# Patient Record
Sex: Female | Born: 1935 | Race: White | Hispanic: No | Smoking: Never smoker
Health system: Southern US, Community
[De-identification: ages and names within clinical notes are randomized; demographics above are authoritative.]

## PROBLEM LIST (undated history)

## (undated) DIAGNOSIS — Z9889 Other specified postprocedural states: Secondary | ICD-10-CM

## (undated) DIAGNOSIS — E785 Hyperlipidemia, unspecified: Secondary | ICD-10-CM

## (undated) DIAGNOSIS — F329 Major depressive disorder, single episode, unspecified: Secondary | ICD-10-CM

## (undated) DIAGNOSIS — I10 Essential (primary) hypertension: Secondary | ICD-10-CM

## (undated) DIAGNOSIS — R112 Nausea with vomiting, unspecified: Secondary | ICD-10-CM

## (undated) DIAGNOSIS — J449 Chronic obstructive pulmonary disease, unspecified: Secondary | ICD-10-CM

## (undated) DIAGNOSIS — F32A Depression, unspecified: Secondary | ICD-10-CM

## (undated) HISTORY — DX: Hyperlipidemia, unspecified: E78.5

## (undated) HISTORY — PX: ABDOMINAL HYSTERECTOMY: SHX81

---

## 2015-09-15 DIAGNOSIS — Z79899 Other long term (current) drug therapy: Secondary | ICD-10-CM | POA: Diagnosis not present

## 2015-09-15 DIAGNOSIS — E785 Hyperlipidemia, unspecified: Secondary | ICD-10-CM | POA: Diagnosis not present

## 2015-09-15 DIAGNOSIS — Z7982 Long term (current) use of aspirin: Secondary | ICD-10-CM | POA: Diagnosis not present

## 2015-09-15 DIAGNOSIS — J449 Chronic obstructive pulmonary disease, unspecified: Secondary | ICD-10-CM | POA: Diagnosis not present

## 2015-09-15 DIAGNOSIS — H26491 Other secondary cataract, right eye: Secondary | ICD-10-CM | POA: Diagnosis not present

## 2015-10-29 DIAGNOSIS — Z139 Encounter for screening, unspecified: Secondary | ICD-10-CM | POA: Diagnosis not present

## 2015-10-29 DIAGNOSIS — E782 Mixed hyperlipidemia: Secondary | ICD-10-CM | POA: Diagnosis not present

## 2015-10-29 DIAGNOSIS — Z1389 Encounter for screening for other disorder: Secondary | ICD-10-CM | POA: Diagnosis not present

## 2015-10-29 DIAGNOSIS — N182 Chronic kidney disease, stage 2 (mild): Secondary | ICD-10-CM | POA: Diagnosis not present

## 2015-10-29 DIAGNOSIS — Z0000 Encounter for general adult medical examination without abnormal findings: Secondary | ICD-10-CM | POA: Diagnosis not present

## 2015-10-29 DIAGNOSIS — I129 Hypertensive chronic kidney disease with stage 1 through stage 4 chronic kidney disease, or unspecified chronic kidney disease: Secondary | ICD-10-CM | POA: Diagnosis not present

## 2015-10-29 DIAGNOSIS — F4321 Adjustment disorder with depressed mood: Secondary | ICD-10-CM | POA: Diagnosis not present

## 2015-10-29 DIAGNOSIS — Z6830 Body mass index (BMI) 30.0-30.9, adult: Secondary | ICD-10-CM | POA: Diagnosis not present

## 2015-11-03 DIAGNOSIS — Z1231 Encounter for screening mammogram for malignant neoplasm of breast: Secondary | ICD-10-CM | POA: Diagnosis not present

## 2016-04-11 DIAGNOSIS — Z6830 Body mass index (BMI) 30.0-30.9, adult: Secondary | ICD-10-CM | POA: Diagnosis not present

## 2016-04-11 DIAGNOSIS — J449 Chronic obstructive pulmonary disease, unspecified: Secondary | ICD-10-CM | POA: Diagnosis not present

## 2016-04-11 DIAGNOSIS — R5381 Other malaise: Secondary | ICD-10-CM | POA: Diagnosis not present

## 2016-05-03 DIAGNOSIS — I129 Hypertensive chronic kidney disease with stage 1 through stage 4 chronic kidney disease, or unspecified chronic kidney disease: Secondary | ICD-10-CM | POA: Diagnosis not present

## 2016-05-03 DIAGNOSIS — N182 Chronic kidney disease, stage 2 (mild): Secondary | ICD-10-CM | POA: Diagnosis not present

## 2016-05-03 DIAGNOSIS — Z23 Encounter for immunization: Secondary | ICD-10-CM | POA: Diagnosis not present

## 2016-05-03 DIAGNOSIS — E782 Mixed hyperlipidemia: Secondary | ICD-10-CM | POA: Diagnosis not present

## 2016-06-13 DIAGNOSIS — H2703 Aphakia, bilateral: Secondary | ICD-10-CM | POA: Diagnosis not present

## 2016-07-15 DIAGNOSIS — L301 Dyshidrosis [pompholyx]: Secondary | ICD-10-CM | POA: Diagnosis not present

## 2016-07-15 DIAGNOSIS — L918 Other hypertrophic disorders of the skin: Secondary | ICD-10-CM | POA: Diagnosis not present

## 2016-07-15 DIAGNOSIS — L820 Inflamed seborrheic keratosis: Secondary | ICD-10-CM | POA: Diagnosis not present

## 2016-09-05 DIAGNOSIS — R062 Wheezing: Secondary | ICD-10-CM | POA: Diagnosis not present

## 2016-09-05 DIAGNOSIS — J101 Influenza due to other identified influenza virus with other respiratory manifestations: Secondary | ICD-10-CM | POA: Diagnosis not present

## 2016-09-05 DIAGNOSIS — Z6831 Body mass index (BMI) 31.0-31.9, adult: Secondary | ICD-10-CM | POA: Diagnosis not present

## 2016-11-04 DIAGNOSIS — Z1231 Encounter for screening mammogram for malignant neoplasm of breast: Secondary | ICD-10-CM | POA: Diagnosis not present

## 2016-11-07 DIAGNOSIS — Z6831 Body mass index (BMI) 31.0-31.9, adult: Secondary | ICD-10-CM | POA: Diagnosis not present

## 2016-11-07 DIAGNOSIS — J441 Chronic obstructive pulmonary disease with (acute) exacerbation: Secondary | ICD-10-CM | POA: Diagnosis not present

## 2016-11-14 DIAGNOSIS — J449 Chronic obstructive pulmonary disease, unspecified: Secondary | ICD-10-CM | POA: Diagnosis not present

## 2016-11-14 DIAGNOSIS — Z139 Encounter for screening, unspecified: Secondary | ICD-10-CM | POA: Diagnosis not present

## 2016-11-14 DIAGNOSIS — Z1389 Encounter for screening for other disorder: Secondary | ICD-10-CM | POA: Diagnosis not present

## 2016-11-14 DIAGNOSIS — Z0000 Encounter for general adult medical examination without abnormal findings: Secondary | ICD-10-CM | POA: Diagnosis not present

## 2016-12-20 DIAGNOSIS — Z6830 Body mass index (BMI) 30.0-30.9, adult: Secondary | ICD-10-CM | POA: Diagnosis not present

## 2016-12-20 DIAGNOSIS — H938X3 Other specified disorders of ear, bilateral: Secondary | ICD-10-CM | POA: Diagnosis not present

## 2017-05-15 DIAGNOSIS — J441 Chronic obstructive pulmonary disease with (acute) exacerbation: Secondary | ICD-10-CM | POA: Diagnosis not present

## 2017-05-15 DIAGNOSIS — J449 Chronic obstructive pulmonary disease, unspecified: Secondary | ICD-10-CM | POA: Diagnosis not present

## 2017-05-15 DIAGNOSIS — Z6830 Body mass index (BMI) 30.0-30.9, adult: Secondary | ICD-10-CM | POA: Diagnosis not present

## 2017-06-01 DIAGNOSIS — J22 Unspecified acute lower respiratory infection: Secondary | ICD-10-CM | POA: Diagnosis not present

## 2017-06-01 DIAGNOSIS — Z6832 Body mass index (BMI) 32.0-32.9, adult: Secondary | ICD-10-CM | POA: Diagnosis not present

## 2017-06-05 DIAGNOSIS — Z6833 Body mass index (BMI) 33.0-33.9, adult: Secondary | ICD-10-CM | POA: Diagnosis not present

## 2017-06-05 DIAGNOSIS — E860 Dehydration: Secondary | ICD-10-CM | POA: Diagnosis not present

## 2017-06-05 DIAGNOSIS — R05 Cough: Secondary | ICD-10-CM | POA: Diagnosis not present

## 2017-06-05 DIAGNOSIS — H2703 Aphakia, bilateral: Secondary | ICD-10-CM | POA: Diagnosis not present

## 2017-06-07 DIAGNOSIS — E871 Hypo-osmolality and hyponatremia: Secondary | ICD-10-CM | POA: Diagnosis not present

## 2017-06-07 DIAGNOSIS — R0602 Shortness of breath: Secondary | ICD-10-CM | POA: Diagnosis not present

## 2017-06-07 DIAGNOSIS — J181 Lobar pneumonia, unspecified organism: Secondary | ICD-10-CM | POA: Diagnosis not present

## 2017-06-07 DIAGNOSIS — K5900 Constipation, unspecified: Secondary | ICD-10-CM | POA: Diagnosis not present

## 2017-06-07 DIAGNOSIS — R740 Nonspecific elevation of levels of transaminase and lactic acid dehydrogenase [LDH]: Secondary | ICD-10-CM | POA: Diagnosis not present

## 2017-06-07 DIAGNOSIS — I5031 Acute diastolic (congestive) heart failure: Secondary | ICD-10-CM | POA: Diagnosis not present

## 2017-06-07 DIAGNOSIS — I10 Essential (primary) hypertension: Secondary | ICD-10-CM | POA: Diagnosis not present

## 2017-06-07 DIAGNOSIS — R51 Headache: Secondary | ICD-10-CM | POA: Diagnosis not present

## 2017-06-07 DIAGNOSIS — J449 Chronic obstructive pulmonary disease, unspecified: Secondary | ICD-10-CM | POA: Diagnosis not present

## 2017-06-07 DIAGNOSIS — J90 Pleural effusion, not elsewhere classified: Secondary | ICD-10-CM | POA: Diagnosis not present

## 2017-06-07 DIAGNOSIS — I110 Hypertensive heart disease with heart failure: Secondary | ICD-10-CM | POA: Diagnosis not present

## 2017-06-07 DIAGNOSIS — K7581 Nonalcoholic steatohepatitis (NASH): Secondary | ICD-10-CM | POA: Diagnosis not present

## 2017-06-07 DIAGNOSIS — I509 Heart failure, unspecified: Secondary | ICD-10-CM | POA: Diagnosis not present

## 2017-06-07 DIAGNOSIS — R05 Cough: Secondary | ICD-10-CM | POA: Diagnosis not present

## 2017-06-07 DIAGNOSIS — I429 Cardiomyopathy, unspecified: Secondary | ICD-10-CM | POA: Diagnosis not present

## 2017-06-07 DIAGNOSIS — S3993XA Unspecified injury of pelvis, initial encounter: Secondary | ICD-10-CM | POA: Diagnosis not present

## 2017-06-07 DIAGNOSIS — R609 Edema, unspecified: Secondary | ICD-10-CM | POA: Diagnosis not present

## 2017-06-07 DIAGNOSIS — E781 Pure hyperglyceridemia: Secondary | ICD-10-CM | POA: Diagnosis not present

## 2017-06-07 DIAGNOSIS — R846 Abnormal cytological findings in specimens from respiratory organs and thorax: Secondary | ICD-10-CM | POA: Diagnosis not present

## 2017-06-07 DIAGNOSIS — I4891 Unspecified atrial fibrillation: Secondary | ICD-10-CM | POA: Diagnosis not present

## 2017-06-07 DIAGNOSIS — Z7982 Long term (current) use of aspirin: Secondary | ICD-10-CM | POA: Diagnosis not present

## 2017-06-07 DIAGNOSIS — Z23 Encounter for immunization: Secondary | ICD-10-CM | POA: Diagnosis not present

## 2017-06-08 DIAGNOSIS — R609 Edema, unspecified: Secondary | ICD-10-CM | POA: Diagnosis not present

## 2017-06-08 DIAGNOSIS — I509 Heart failure, unspecified: Secondary | ICD-10-CM | POA: Diagnosis not present

## 2017-06-08 DIAGNOSIS — I4891 Unspecified atrial fibrillation: Secondary | ICD-10-CM | POA: Diagnosis not present

## 2017-06-08 DIAGNOSIS — R0602 Shortness of breath: Secondary | ICD-10-CM | POA: Diagnosis not present

## 2017-06-09 DIAGNOSIS — E871 Hypo-osmolality and hyponatremia: Secondary | ICD-10-CM | POA: Diagnosis not present

## 2017-06-09 DIAGNOSIS — R0602 Shortness of breath: Secondary | ICD-10-CM

## 2017-06-09 DIAGNOSIS — J90 Pleural effusion, not elsewhere classified: Secondary | ICD-10-CM | POA: Diagnosis not present

## 2017-06-09 DIAGNOSIS — I509 Heart failure, unspecified: Secondary | ICD-10-CM | POA: Diagnosis not present

## 2017-06-09 DIAGNOSIS — I4891 Unspecified atrial fibrillation: Secondary | ICD-10-CM | POA: Diagnosis not present

## 2017-06-09 DIAGNOSIS — J181 Lobar pneumonia, unspecified organism: Secondary | ICD-10-CM | POA: Diagnosis not present

## 2017-06-09 DIAGNOSIS — R740 Nonspecific elevation of levels of transaminase and lactic acid dehydrogenase [LDH]: Secondary | ICD-10-CM | POA: Diagnosis not present

## 2017-06-09 DIAGNOSIS — R609 Edema, unspecified: Secondary | ICD-10-CM

## 2017-06-09 DIAGNOSIS — I10 Essential (primary) hypertension: Secondary | ICD-10-CM | POA: Diagnosis not present

## 2017-06-10 DIAGNOSIS — I4891 Unspecified atrial fibrillation: Secondary | ICD-10-CM | POA: Diagnosis not present

## 2017-06-10 DIAGNOSIS — I509 Heart failure, unspecified: Secondary | ICD-10-CM | POA: Diagnosis not present

## 2017-06-10 DIAGNOSIS — R609 Edema, unspecified: Secondary | ICD-10-CM | POA: Diagnosis not present

## 2017-06-12 ENCOUNTER — Inpatient Hospital Stay (HOSPITAL_COMMUNITY)
Admission: AD | Admit: 2017-06-12 | Discharge: 2017-06-14 | DRG: 308 | Disposition: A | Discharge status: Home / Self Care | Payer: PPO | Source: Other Acute Inpatient Hospital | Attending: Family Medicine | Admitting: Family Medicine

## 2017-06-12 ENCOUNTER — Encounter (HOSPITAL_COMMUNITY): Payer: Self-pay | Admitting: Internal Medicine

## 2017-06-12 DIAGNOSIS — I429 Cardiomyopathy, unspecified: Secondary | ICD-10-CM | POA: Diagnosis not present

## 2017-06-12 DIAGNOSIS — I481 Persistent atrial fibrillation: Principal | ICD-10-CM | POA: Diagnosis present

## 2017-06-12 DIAGNOSIS — I253 Aneurysm of heart: Secondary | ICD-10-CM | POA: Diagnosis present

## 2017-06-12 DIAGNOSIS — I5023 Acute on chronic systolic (congestive) heart failure: Secondary | ICD-10-CM | POA: Diagnosis present

## 2017-06-12 DIAGNOSIS — I4891 Unspecified atrial fibrillation: Secondary | ICD-10-CM | POA: Diagnosis not present

## 2017-06-12 DIAGNOSIS — E669 Obesity, unspecified: Secondary | ICD-10-CM | POA: Diagnosis not present

## 2017-06-12 DIAGNOSIS — I5021 Acute systolic (congestive) heart failure: Secondary | ICD-10-CM | POA: Diagnosis not present

## 2017-06-12 DIAGNOSIS — J90 Pleural effusion, not elsewhere classified: Secondary | ICD-10-CM | POA: Diagnosis present

## 2017-06-12 DIAGNOSIS — R7401 Elevation of levels of liver transaminase levels: Secondary | ICD-10-CM | POA: Diagnosis present

## 2017-06-12 DIAGNOSIS — E871 Hypo-osmolality and hyponatremia: Secondary | ICD-10-CM | POA: Diagnosis present

## 2017-06-12 DIAGNOSIS — Z90710 Acquired absence of both cervix and uterus: Secondary | ICD-10-CM

## 2017-06-12 DIAGNOSIS — I340 Nonrheumatic mitral (valve) insufficiency: Secondary | ICD-10-CM | POA: Diagnosis not present

## 2017-06-12 DIAGNOSIS — K5900 Constipation, unspecified: Secondary | ICD-10-CM | POA: Diagnosis not present

## 2017-06-12 DIAGNOSIS — R740 Nonspecific elevation of levels of transaminase and lactic acid dehydrogenase [LDH]: Secondary | ICD-10-CM

## 2017-06-12 DIAGNOSIS — J181 Lobar pneumonia, unspecified organism: Secondary | ICD-10-CM | POA: Diagnosis not present

## 2017-06-12 DIAGNOSIS — I959 Hypotension, unspecified: Secondary | ICD-10-CM | POA: Diagnosis not present

## 2017-06-12 DIAGNOSIS — Z6830 Body mass index (BMI) 30.0-30.9, adult: Secondary | ICD-10-CM

## 2017-06-12 DIAGNOSIS — I361 Nonrheumatic tricuspid (valve) insufficiency: Secondary | ICD-10-CM | POA: Diagnosis not present

## 2017-06-12 DIAGNOSIS — F329 Major depressive disorder, single episode, unspecified: Secondary | ICD-10-CM | POA: Diagnosis not present

## 2017-06-12 DIAGNOSIS — Z23 Encounter for immunization: Secondary | ICD-10-CM | POA: Diagnosis not present

## 2017-06-12 DIAGNOSIS — Z7982 Long term (current) use of aspirin: Secondary | ICD-10-CM | POA: Diagnosis not present

## 2017-06-12 DIAGNOSIS — Q211 Atrial septal defect: Secondary | ICD-10-CM

## 2017-06-12 DIAGNOSIS — J449 Chronic obstructive pulmonary disease, unspecified: Secondary | ICD-10-CM | POA: Diagnosis present

## 2017-06-12 DIAGNOSIS — Z8249 Family history of ischemic heart disease and other diseases of the circulatory system: Secondary | ICD-10-CM | POA: Diagnosis not present

## 2017-06-12 DIAGNOSIS — I10 Essential (primary) hypertension: Secondary | ICD-10-CM

## 2017-06-12 DIAGNOSIS — I083 Combined rheumatic disorders of mitral, aortic and tricuspid valves: Secondary | ICD-10-CM | POA: Diagnosis not present

## 2017-06-12 DIAGNOSIS — I509 Heart failure, unspecified: Secondary | ICD-10-CM | POA: Diagnosis not present

## 2017-06-12 DIAGNOSIS — I5031 Acute diastolic (congestive) heart failure: Secondary | ICD-10-CM | POA: Diagnosis not present

## 2017-06-12 DIAGNOSIS — I110 Hypertensive heart disease with heart failure: Secondary | ICD-10-CM | POA: Diagnosis present

## 2017-06-12 DIAGNOSIS — I5042 Chronic combined systolic (congestive) and diastolic (congestive) heart failure: Secondary | ICD-10-CM | POA: Diagnosis present

## 2017-06-12 DIAGNOSIS — I480 Paroxysmal atrial fibrillation: Secondary | ICD-10-CM | POA: Diagnosis present

## 2017-06-12 DIAGNOSIS — E781 Pure hyperglyceridemia: Secondary | ICD-10-CM | POA: Diagnosis not present

## 2017-06-12 DIAGNOSIS — K7581 Nonalcoholic steatohepatitis (NASH): Secondary | ICD-10-CM | POA: Diagnosis not present

## 2017-06-12 HISTORY — DX: Other specified postprocedural states: Z98.890

## 2017-06-12 HISTORY — DX: Chronic obstructive pulmonary disease, unspecified: J44.9

## 2017-06-12 HISTORY — DX: Depression, unspecified: F32.A

## 2017-06-12 HISTORY — DX: Nausea with vomiting, unspecified: R11.2

## 2017-06-12 HISTORY — DX: Major depressive disorder, single episode, unspecified: F32.9

## 2017-06-12 HISTORY — DX: Essential (primary) hypertension: I10

## 2017-06-12 MED ORDER — POLYETHYLENE GLYCOL 3350 17 G PO PACK
17.0000 g | PACK | Freq: Every day | ORAL | Status: DC | PRN
  Administered 2017-06-13: 17 g via ORAL
  Filled 2017-06-12 (×2): qty 1

## 2017-06-12 MED ORDER — BUDESONIDE 0.5 MG/2ML IN SUSP
0.5000 mg | Freq: Two times a day (BID) | RESPIRATORY_TRACT | Status: DC
  Administered 2017-06-12 – 2017-06-14 (×4): 0.5 mg via RESPIRATORY_TRACT
  Filled 2017-06-12 (×4): qty 2

## 2017-06-12 MED ORDER — ONDANSETRON HCL 4 MG/2ML IJ SOLN: 4.0000 mg | Freq: Four times a day (QID) | INTRAMUSCULAR | Status: DC | PRN

## 2017-06-12 MED ORDER — ATORVASTATIN CALCIUM 20 MG PO TABS
20.0000 mg | ORAL_TABLET | Freq: Every day | ORAL | Status: DC
  Administered 2017-06-13: 20 mg via ORAL
  Filled 2017-06-12: qty 1

## 2017-06-12 MED ORDER — FLUTICASONE FUROATE-VILANTEROL 200-25 MCG/INH IN AEPB
1.0000 | INHALATION_SPRAY | Freq: Every day | RESPIRATORY_TRACT | Status: DC
  Filled 2017-06-12: qty 28

## 2017-06-12 MED ORDER — APIXABAN 5 MG PO TABS
5.0000 mg | ORAL_TABLET | Freq: Two times a day (BID) | ORAL | Status: DC
  Administered 2017-06-12 – 2017-06-14 (×4): 5 mg via ORAL
  Filled 2017-06-12 (×4): qty 1

## 2017-06-12 MED ORDER — ONDANSETRON HCL 4 MG PO TABS: 4.0000 mg | ORAL_TABLET | Freq: Four times a day (QID) | ORAL | Status: DC | PRN

## 2017-06-12 MED ORDER — DIGOXIN 125 MCG PO TABS
0.1250 mg | ORAL_TABLET | Freq: Every day | ORAL | Status: DC
  Administered 2017-06-14: 0.125 mg via ORAL
  Filled 2017-06-12 (×2): qty 1

## 2017-06-12 MED ORDER — ALBUTEROL SULFATE (2.5 MG/3ML) 0.083% IN NEBU: 2.5000 mg | INHALATION_SOLUTION | RESPIRATORY_TRACT | Status: DC | PRN

## 2017-06-12 MED ORDER — ACETAMINOPHEN 325 MG PO TABS: 650.0000 mg | ORAL_TABLET | Freq: Four times a day (QID) | ORAL | Status: DC | PRN

## 2017-06-12 MED ORDER — METOPROLOL TARTRATE 50 MG PO TABS
50.0000 mg | ORAL_TABLET | Freq: Two times a day (BID) | ORAL | Status: DC
  Filled 2017-06-12: qty 1

## 2017-06-12 MED ORDER — ACETAMINOPHEN 650 MG RE SUPP: 650.0000 mg | Freq: Four times a day (QID) | RECTAL | Status: DC | PRN

## 2017-06-12 MED ORDER — METOPROLOL TARTRATE 25 MG PO TABS
25.0000 mg | ORAL_TABLET | Freq: Two times a day (BID) | ORAL | Status: DC
  Administered 2017-06-12 – 2017-06-13 (×2): 25 mg via ORAL
  Filled 2017-06-12 (×2): qty 1

## 2017-06-12 NOTE — H&P (Signed)
History and Physical    Stephanie Douglas ZOX:[id number] DOB: [birth year] DOA: 06/12/2017  PCP: Marco Collie, MD  Patient coming from: [facility]  I have personally briefly reviewed patient's old medical records in [place]  Chief Complaint: A.Fib RVR  HPI: Stephanie Douglas is a 83 y.o. female with medical history significant of HTN.  Patient presents to ED at [facility] on 11/7 with 3 week history of cough, now progressed to leg swelling, dizziness, generalized weakness.  Found to have CHF, pleural effusion, and be in A.Fib RVR during work up in ED.  HR initially reduced with cardizem gtt.  Echo showed EF 35-40%.  Converted to metoprolol (50mg  BID according to progress notes but 25 mg BID according to med rec).  Digoxin added.  Rate moderately controlled but still 110-130s.  Diuresed 5L but then diuresis had to be held due to hypotension.  Lexiscan performed and was normal.  Eliquis started on Friday.  Patient transferred to [facility] to undergo TEE cardioversion tomorrow.   Review of Systems: As per HPI otherwise 10 point review of systems negative.   Past Medical History:  Diagnosis Date  . COPD (chronic obstructive pulmonary disease) ([place])   . Depression   . HTN (hypertension)     Past Surgical History:  Procedure Laterality Date  . ABDOMINAL HYSTERECTOMY       reports that  has never smoked. She does not have any smokeless tobacco history on file. She reports that she does not drink alcohol or use drugs.  Allergies no known allergies  No family history on file. No known h/o A.Fib  Prior to Admission medications   Not on File    Physical Exam: Vitals:   06/12/17 2100  Resp: 12  Temp: 98.9 F (37.2 C)  TempSrc: Oral  Weight: 81.6 kg (180 lb)  Height: 5\' 5"  (1.651 m)    Constitutional: NAD, calm, comfortable Eyes: PERRL, lids and conjunctivae normal ENMT: Mucous membranes are moist. Posterior pharynx clear of any exudate or lesions.Normal dentition.  Neck: normal,  supple, no masses, no thyromegaly Respiratory: clear to auscultation bilaterally, no wheezing, no crackles. Normal respiratory effort. No accessory muscle use.  Cardiovascular: IRR, IRR, tachycardic Abdomen: no tenderness, no masses palpated. No hepatosplenomegaly. Bowel sounds positive.  Musculoskeletal: no clubbing / cyanosis. No joint deformity upper and lower extremities. Good ROM, no contractures. Normal muscle tone.  Skin: no rashes, lesions, ulcers. No induration Neurologic: CN 2-12 grossly intact. Sensation intact, DTR normal. Strength 5/5 in all 4.  Psychiatric: Normal judgment and insight. Alert and oriented x 3. Normal mood.    Labs on Admission: I have personally reviewed following labs and imaging studies  CBC: No results for input(s): WBC, NEUTROABS, HGB, HCT, MCV, PLT in the last 168 hours. Basic Metabolic Panel: No results for input(s): NA, K, CL, CO2, GLUCOSE, BUN, CREATININE, CALCIUM, MG, PHOS in the last 168 hours. GFR: CrCl cannot be calculated (No order found.). Liver Function Tests: No results for input(s): AST, ALT, ALKPHOS, BILITOT, PROT, ALBUMIN in the last 168 hours. No results for input(s): LIPASE, AMYLASE in the last 168 hours. No results for input(s): AMMONIA in the last 168 hours. Coagulation Profile: No results for input(s): INR, PROTIME in the last 168 hours. Cardiac Enzymes: No results for input(s): CKTOTAL, CKMB, CKMBINDEX, TROPONINI in the last 168 hours. BNP (last 3 results) No results for input(s): PROBNP in the last 8760 hours. HbA1C: No results for input(s): HGBA1C in the last 72 hours. CBG: No results for  input(s): GLUCAP in the last 168 hours. Lipid Profile: No results for input(s): CHOL, HDL, LDLCALC, TRIG, CHOLHDL, LDLDIRECT in the last 72 hours. Thyroid Function Tests: No results for input(s): TSH, T4TOTAL, FREET4, T3FREE, THYROIDAB in the last 72 hours. Anemia Panel: No results for input(s): VITAMINB12, FOLATE, FERRITIN, TIBC, IRON,  RETICCTPCT in the last 72 hours. Urine analysis: No results found for: COLORURINE, APPEARANCEUR, LABSPEC, PHURINE, GLUCOSEU, HGBUR, BILIRUBINUR, KETONESUR, PROTEINUR, UROBILINOGEN, NITRITE, LEUKOCYTESUR  Radiological Exams on Admission: No results found.  EKG: Independently reviewed.  Assessment/Plan Principal Problem:   Atrial fibrillation with RVR (HCC) Active Problems:   Acute systolic CHF (congestive heart failure) (HCC)   Pleural effusion on right   Hyponatremia   Transaminitis   HTN (hypertension)   COPD (chronic obstructive pulmonary disease) ([place])    1. A.Fib RVR - 1. Chads vasc of 5 2. Eliquis started on Friday, will continue 3. TSH nl 4. No underlying infection 5. Trop nl 6. Echo showed EF 35-40%, no regional wall motion abnormalities 7. Lexiscan neg 8. Metoprolol 25mg  PO BID 1. Note that Dr. Sabino Niemann notes say 50mg  BID as of yesterday; however, med rec on transfer shows 25mg  PO BID so will continue at this lower dose for now especially given hypotension issues during stay at [place]. 9. Dr. Loleta Books spoke with Dr. Johnsie Cancel, plan is for TEE cardioversion tomorrow 1. NPO after MN 10. Tele monitor 2. Acute CHF - appears systolic with reduced EF 1. Holding diuretics at the moment due to problems with hypotension with diuresis 1. May want to re-assess this after cardioversion 2. Currently net neg 5L since admit 3. Strict intake and output 3. Pleural effusion on right - 1. Transudate, 750 cc removed on thoracentesis.  C/W CHF 4. Hyponatremia - on admit to [facility], resolved according to notes 5. Transaminitis - 1. Repeat CMP in AM 2. CHF vs NASH 6. HTN - 1. BP has been running "soft" and [place] 2. Holding amlodipine 3. Metoprolol 25mg  PO BID 7. COPD - 1. Continue pulmicort 8. Other meds: 1. Lexapro on hold given hyponatremia 2. Miralax PRN  DVT prophylaxis: Eliquis Code Status: Full Family Communication: Family at bedside Disposition Plan: Home after  admit Consults called: Message put in to P. [facility] for Cards to see patient, see above Admission status: Admit to inpatient   Etta Quill DO [facility] Pager [phone]  If 7AM-7PM, please contact day team taking care of patient [email or web address] Password [email or web address]  06/12/2017, 10:30 PM

## 2017-06-12 NOTE — Progress Notes (Signed)
ANTICOAGULATION CONSULT NOTE - Initial Consult  Pharmacy Consult for Eliquis Indication: atrial fibrillation  Allergies no known allergies  Patient Measurements: Height: 5\' 5"  (165.1 cm) Weight: 180 lb (81.6 kg) IBW/kg (Calculated) : 57  Vital Signs: Temp: 98.5 F (36.9 C) (11/12 2200) Temp Source: Oral (11/12 2200) BP: 121/81 (11/12 2200) Pulse Rate: 129 (11/12 2235)  Labs from [facility]:  Creatinine 0.7 on 11/12  Hgb 12.6, platelet count 158 on 11/11 (platelet count 188 on admit 11/7)  Medical History: Past Medical History:  Diagnosis Date  . COPD (chronic obstructive pulmonary disease) ([place])   . Depression   . HTN (hypertension)    Assessment:  83 yr old female transferred from [facility] to [facility].   Started on Eliquis 5 mg BID on 11/9 and to continue at [facility].     Cardioversion planned for 11/58 am.  >26 years old, but weight >60 kg and Scr < 1.5; full-dose Eliquis is appropriate  Goal of Therapy:  appropriate Eliquis dose for indication Monitor platelets by anticoagulation protocol: Yes   Plan:   Continue Eliquis 5 mg PO BID.  Intermittent CBC.  Monitor for any bleeding.  Education prior to discharge.  Arty Baumgartner, [place] Pager: [phone] 06/12/2017,11:21 PM

## 2017-06-13 ENCOUNTER — Other Ambulatory Visit: Payer: Self-pay

## 2017-06-13 ENCOUNTER — Inpatient Hospital Stay (HOSPITAL_COMMUNITY): Payer: PPO | Admitting: Anesthesiology

## 2017-06-13 ENCOUNTER — Encounter (HOSPITAL_COMMUNITY): Payer: Self-pay | Admitting: Cardiology

## 2017-06-13 ENCOUNTER — Encounter (HOSPITAL_COMMUNITY)
Admission: AD | Disposition: A | Discharge status: Home / Self Care | Payer: Self-pay | Source: Other Acute Inpatient Hospital | Attending: Internal Medicine

## 2017-06-13 ENCOUNTER — Ambulatory Visit (HOSPITAL_COMMUNITY): Admission: RE | Admit: 2017-06-13 | Payer: PPO | Source: Ambulatory Visit | Admitting: Cardiology

## 2017-06-13 ENCOUNTER — Inpatient Hospital Stay (HOSPITAL_COMMUNITY)
Admission: AD | Admit: 2017-06-13 | Discharge: 2017-06-13 | Disposition: A | Discharge status: Home / Self Care | Payer: PPO | Source: Other Acute Inpatient Hospital | Attending: Cardiology | Admitting: Cardiology

## 2017-06-13 DIAGNOSIS — I4891 Unspecified atrial fibrillation: Secondary | ICD-10-CM

## 2017-06-13 DIAGNOSIS — I340 Nonrheumatic mitral (valve) insufficiency: Secondary | ICD-10-CM

## 2017-06-13 HISTORY — PX: CARDIOVERSION: SHX1299

## 2017-06-13 HISTORY — PX: TEE WITHOUT CARDIOVERSION: SHX5443

## 2017-06-13 LAB — COMPREHENSIVE METABOLIC PANEL
ALBUMIN: 2.9 g/dL — AB (ref 3.5–5.0)
ALK PHOS: 67 U/L (ref 38–126)
ALT: 31 U/L (ref 14–54)
ANION GAP: 8 (ref 5–15)
AST: 23 U/L (ref 15–41)
BILIRUBIN TOTAL: 0.9 mg/dL (ref 0.3–1.2)
BUN: 14 mg/dL (ref 6–20)
CO2: 29 mmol/L (ref 22–32)
Calcium: 8.7 mg/dL — ABNORMAL LOW (ref 8.9–10.3)
Chloride: 98 mmol/L — ABNORMAL LOW (ref 101–111)
Creatinine, Ser: 0.66 mg/dL (ref 0.44–1.00)
GFR calc non Af Amer: >60 mL/min (ref >60)
GLUCOSE: 111 mg/dL — AB (ref 65–99)
POTASSIUM: 4.2 mmol/L (ref 3.5–5.1)
Sodium: 135 mmol/L (ref 135–145)
TOTAL PROTEIN: 5.5 g/dL — AB (ref 6.5–8.1)

## 2017-06-13 LAB — CBC
HEMATOCRIT: 37.7 % (ref 36.0–46.0)
HEMOGLOBIN: 12.3 g/dL (ref 12.0–15.0)
MCH: 30.1 pg (ref 26.0–34.0)
MCHC: 32.6 g/dL (ref 30.0–36.0)
MCV: 92.4 fL (ref 78.0–100.0)
Platelets: 170 10*3/uL (ref 150–400)
RBC: 4.08 MIL/uL (ref 3.87–5.11)
RDW: 15.6 % — AB (ref 11.5–15.5)
WBC: 8.5 10*3/uL (ref 4.0–10.5)

## 2017-06-13 SURGERY — CARDIOVERSION
Anesthesia: Monitor Anesthesia Care

## 2017-06-13 MED ORDER — PHENYLEPHRINE HCL 10 MG/ML IJ SOLN
INTRAMUSCULAR | Status: DC | PRN
  Administered 2017-06-13 (×2): 80 ug via INTRAVENOUS

## 2017-06-13 MED ORDER — GUAIFENESIN-DM 100-10 MG/5ML PO SYRP
5.0000 mL | ORAL_SOLUTION | ORAL | Status: DC | PRN
  Administered 2017-06-13: 5 mL via ORAL
  Filled 2017-06-13: qty 5

## 2017-06-13 MED ORDER — LACTATED RINGERS IV SOLN
INTRAVENOUS | Status: DC
  Administered 2017-06-13: 1000 mL via INTRAVENOUS

## 2017-06-13 MED ORDER — PROPOFOL 500 MG/50ML IV EMUL
INTRAVENOUS | Status: DC | PRN
  Administered 2017-06-13: 50 ug/kg/min via INTRAVENOUS

## 2017-06-13 MED ORDER — SODIUM CHLORIDE 0.9 % IV SOLN: INTRAVENOUS | Status: DC

## 2017-06-13 MED ORDER — PROPOFOL 10 MG/ML IV BOLUS
INTRAVENOUS | Status: DC | PRN
  Administered 2017-06-13: 20 mg via INTRAVENOUS

## 2017-06-13 MED ORDER — BUTAMBEN-TETRACAINE-BENZOCAINE 2-2-14 % EX AERO
INHALATION_SPRAY | CUTANEOUS | Status: DC | PRN
  Administered 2017-06-13: 2 via TOPICAL

## 2017-06-13 NOTE — Anesthesia Postprocedure Evaluation (Signed)
Anesthesia Post Note  Patient: Stephanie Douglas  Procedure(s) Performed: CARDIOVERSION (N/A ) TRANSESOPHAGEAL ECHOCARDIOGRAM (TEE) (N/A )     Patient location during evaluation: PACU Anesthesia Type: MAC Level of consciousness: awake and alert Pain management: pain level controlled Vital Signs Assessment: post-procedure vital signs reviewed and stable Respiratory status: spontaneous breathing, nonlabored ventilation and respiratory function stable Cardiovascular status: stable and blood pressure returned to baseline Anesthetic complications: no    Last Vitals:  Vitals:   06/13/17 1534 06/13/17 1548  BP: 104/86   Pulse:    Resp:    Temp:    SpO2:  96%    Last Pain:  Vitals:   06/13/17 1318  TempSrc: Oral                 Audry Pili

## 2017-06-13 NOTE — H&P (View-Only) (Signed)
Cardiology Consultation:   Patient ID: Stephanie Douglas; [id number]; [birth year]   Admit date: 06/12/2017 Date of Consult: 06/13/2017  Primary Care Provider: Marco Collie, MD Primary Cardiologist: New-Dr. Percival Douglas   Patient Profile:   Stephanie Douglas is a 83 y.o. female with a hx of HTN who is being seen today for the evaluation of atrial fib and CHF at the request of Dr. Cathlean Douglas.  History of Present Illness:   Stephanie Douglas presented to the ED at [facility] on 11/7 with a 3 week history of cough that progressed with leg swelling, dizziness, and generalized weakness. She was found to have CHF, pleural effusion and was in afib with RVR. Her heart rate was intially reduced with cardizem drip. Echo showed EF 35-40% so cardizem was switched to metoprolol. Digoxin was added. Heart rates have been moderately controlled but still in the 110-130's. She was diuresed 5L but diuresis was held due to hypotension. Lexiscan was normal. Eliquis was started on Friday.   The patient thinks that she may have had symptoms of palpitations for 2-3 weeks and then she began to develop edema and DOE. 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 Currently she denies chest discomfort, dyspnea, dizziness, palpitations, swelling.   With continued poorly controlled heart rates and heart failure, the patient was transferred to [facility] for TEE/cardioversion.   Past Medical History:  Diagnosis Date  . COPD (chronic obstructive pulmonary disease) ([place])   . Depression   . HTN (hypertension)     Past Surgical History:  Procedure Laterality Date  . ABDOMINAL HYSTERECTOMY       Home Medications:  Prior to Admission medications   Not on File    Inpatient Medications: Scheduled Meds: . apixaban  5 mg Oral BID  . atorvastatin  20 mg Oral q1800  . budesonide (PULMICORT) nebulizer solution  0.5 mg Nebulization BID  . digoxin  0.125 mg Oral Daily  . metoprolol tartrate  25 mg Oral BID   Continuous  Infusions:  PRN Meds: acetaminophen **OR** acetaminophen, albuterol, guaiFENesin-dextromethorphan, ondansetron **OR** ondansetron (ZOFRAN) IV, polyethylene glycol  Allergies:   Allergies no known allergies  Social History:   Social History   Socioeconomic History  . Marital status: Unknown    Spouse name: Not on file  . Number of children: Not on file  . Years of education: Not on file  . Highest education level: Not on file  Social Needs  . Financial resource strain: Not on file  . Food insecurity - worry: Not on file  . Food insecurity - inability: Not on file  . Transportation needs - medical: Not on file  . Transportation needs - non-medical: Not on file  Occupational History  . Not on file  Tobacco Use  . Smoking status: Never Smoker  Substance and Sexual Activity  . Alcohol use: No    Frequency: Never  . Drug use: No  . Sexual activity: Not on file  Other Topics Concern  . Not on file  Social History Narrative  . Not on file    Family History:    Family History  Problem Relation Age of Onset  . CAD Mother   . Cardiomyopathy Mother   . Diabetes Mother   . Asthma Father   . Breast cancer Sister   . Diabetes Brother   . Hypertension Brother   . AAA (abdominal aortic aneurysm) Sister      ROS:  Please see the history of  present illness.  ROS  All other ROS reviewed and negative.     Physical Exam/Data:   Vitals:   06/13/17 0500 06/13/17 0830 06/13/17 0832 06/13/17 0935  BP: 106/77 90/68 97/64    Pulse: 99     Resp: 17     Temp: 98 F (36.7 C)     TempSrc: Oral     SpO2: 97%   96%  Weight: 180 lb (81.6 kg)     Height:       No intake or output data in the 24 hours ending 06/13/17 0947 Filed Weights   06/12/17 2100 06/13/17 0500  Weight: 180 lb (81.6 kg) 180 lb (81.6 kg)   Body mass index is 29.95 kg/m.  General:  Well nourished, well developed, in no acute distress HEENT: normal Lymph: no adenopathy Neck: no JVD Endocrine:  No  thryomegaly Vascular: No carotid bruits; FA pulses 2+ bilaterally without bruits  Cardiac:  normal S1, S2; Irregularly irregular rhythm; no murmur  Lungs:  clear to auscultation bilaterally, no wheezing, rhonchi or rales  Abd: soft, nontender, no hepatomegaly  Ext: no edema Musculoskeletal:  No deformities, BUE and BLE strength normal and equal Skin: warm and dry  Neuro:  CNs 2-12 intact, no focal abnormalities noted Psych:  Normal affect   EKG:  The EKG was personally reviewed and demonstrates:  None available Telemetry:  Telemetry was personally reviewed and demonstrates:  Atrial fibrillation 110's-140  Relevant CV Studies:  TEE pending  Laboratory Data:  Chemistry Recent Labs  Lab 06/13/17 0213  NA 135  K 4.2  CL 98*  CO2 29  GLUCOSE 111*  BUN 14  CREATININE 0.66  CALCIUM 8.7*  GFRNONAA >60  GFRAA >60  ANIONGAP 8    Recent Labs  Lab 06/13/17 0213  PROT 5.5*  ALBUMIN 2.9*  AST 23  ALT 31  ALKPHOS 67  BILITOT 0.9   Hematology Recent Labs  Lab 06/13/17 0213  WBC 8.5  RBC 4.08  HGB 12.3  HCT 37.7  MCV 92.4  MCH 30.1  MCHC 32.6  RDW 15.6*  PLT 170   Cardiac EnzymesNo results for input(s): TROPONINI in the last 168 hours. No results for input(s): TROPIPOC in the last 168 hours.  BNPNo results for input(s): BNP, PROBNP in the last 168 hours.  DDimer No results for input(s): DDIMER in the last 168 hours.  Radiology/Studies:  No results found.  Assessment and Plan:   1. Afib with RVR: New onset, with CHF. Rates difficult to control. Now on metoprolol and digoxin. Meds limited by hypotension. CHA2DS2/VAS Stroke Risk Score is 5  (CHF, HTN, Age (2), female). Eliquis was started on Friday.  Low risk lexiscan at [place]. TSH normal. EF 35-40%, Plan for TEE/DCCV today.   2. Acute CHF: Pt with dyspnea and edema on admission, now improved. Pro-BNP 6440 on 11/10. May be tachycardia induced. Was diuresed 5L. Diuresis currently on hold due to hypotension. Strict  I&O and daily wt. Consider rechecking echo in 3 months of maintenance of sinus rhythm/rate control. Consider starting low dose carvedilol after cardioversion if she is able to tolerate. Consider ACE-I once BP is normalized.   3. Pleural effusion: 750 cc removed on thoracentesis, consistent with CHF.  4. Hyponatremia: On admission, resolving, likely related to volume overload.  5. Transaminitis: CHF vs NASH. Improved.   6. Hypertension: BP has been running soft. Off antihypertensives except BB.    For questions or updates, please contact [place] Please consult [email or web address] for contact  info under Cardiology/STEMI.   Signed, Daune Perch, NP  06/13/2017 9:47 AM  History and all data above reviewed.  Patient examined.  I agree with the findings as above.   The patient presents with SOB and tachycardia.  She has been managed for atrial fib with RVR and newly diagnosed congestive HF.  He has had hypotension with diuresis.  She is referred now for further management.  The patient exam reveals [id number]  ,  Lungs: Clear  ,  Abd: Positive bowel sounds, no rebound no guarding, Ext No edema  .  All available labs, radiology testing, previous records reviewed. Agree with documented assessment and plan. Atrial fib:  Plan TEE DCCV.  Off other meds for now although I will try to titrate beta blockers as her BP allows.  On anticoagulation now.   Cardiomyopathy:  No evidence of ischemia.  Question secondary to atrial fib.  Rhythm control and med titration as tolerated and then relook echo.    Jeneen Rinks Temperance Kelemen  11:45 AM  06/13/2017

## 2017-06-13 NOTE — Interval H&P Note (Signed)
History and Physical Interval Note:  06/13/2017 2:14 PM  Stephanie Douglas  has presented today for surgery, with the diagnosis of afib  The various methods of treatment have been discussed with the patient and family. After consideration of risks, benefits and other options for treatment, the patient has consented to  Procedure(s): CARDIOVERSION (N/A) TRANSESOPHAGEAL ECHOCARDIOGRAM (TEE) (N/A) as a surgical intervention .  The patient's history has been reviewed, patient examined, no change in status, stable for surgery.  I have reviewed the patient's chart and labs.  Questions were answered to the patient's satisfaction.     Kirk Ruths

## 2017-06-13 NOTE — Consult Note (Signed)
Cardiology Consultation:   Patient ID: Caroly Purewal; [id number]; 1934/05/25   Admit date: 06/12/2017 Date of Consult: 06/13/2017  Primary Care Provider: Marco Collie, MD Primary Cardiologist: New-Dr. Percival Spanish   Patient Profile:   Micheline Markes is a 83 y.o. female with a hx of HTN who is being seen today for the evaluation of atrial fib and CHF at the request of Dr. Cathlean Sauer.  History of Present Illness:   Ms. Spitzley presented to the ED at [facility] on 11/7 with a 3 week history of cough that progressed with leg swelling, dizziness, and generalized weakness. She was found to have CHF, pleural effusion and was in afib with RVR. Her heart rate was intially reduced with cardizem drip. Echo showed EF 35-40% so cardizem was switched to metoprolol. Digoxin was added. Heart rates have been moderately controlled but still in the 110-130's. She was diuresed 5L but diuresis was held due to hypotension. Lexiscan was normal. Eliquis was started on Friday.   The patient thinks that she may have had symptoms of palpitations for 2-3 weeks and then she began to develop edema and DOE. 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 Currently she denies chest discomfort, dyspnea, dizziness, palpitations, swelling.   With continued poorly controlled heart rates and heart failure, the patient was transferred to [facility] for TEE/cardioversion.   Past Medical History:  Diagnosis Date  . COPD (chronic obstructive pulmonary disease) ([place])   . Depression   . HTN (hypertension)     Past Surgical History:  Procedure Laterality Date  . ABDOMINAL HYSTERECTOMY       Home Medications:  Prior to Admission medications   Not on File    Inpatient Medications: Scheduled Meds: . apixaban  5 mg Oral BID  . atorvastatin  20 mg Oral q1800  . budesonide (PULMICORT) nebulizer solution  0.5 mg Nebulization BID  . digoxin  0.125 mg Oral Daily  . metoprolol tartrate  25 mg Oral BID   Continuous  Infusions:  PRN Meds: acetaminophen **OR** acetaminophen, albuterol, guaiFENesin-dextromethorphan, ondansetron **OR** ondansetron (ZOFRAN) IV, polyethylene glycol  Allergies:   Allergies no known allergies  Social History:   Social History   Socioeconomic History  . Marital status: Unknown    Spouse name: Not on file  . Number of children: Not on file  . Years of education: Not on file  . Highest education level: Not on file  Social Needs  . Financial resource strain: Not on file  . Food insecurity - worry: Not on file  . Food insecurity - inability: Not on file  . Transportation needs - medical: Not on file  . Transportation needs - non-medical: Not on file  Occupational History  . Not on file  Tobacco Use  . Smoking status: Never Smoker  Substance and Sexual Activity  . Alcohol use: No    Frequency: Never  . Drug use: No  . Sexual activity: Not on file  Other Topics Concern  . Not on file  Social History Narrative  . Not on file    Family History:    Family History  Problem Relation Age of Onset  . CAD Mother   . Cardiomyopathy Mother   . Diabetes Mother   . Asthma Father   . Breast cancer Sister   . Diabetes Brother   . Hypertension Brother   . AAA (abdominal aortic aneurysm) Sister      ROS:  Please see the history of  present illness.  ROS  All other ROS reviewed and negative.     Physical Exam/Data:   Vitals:   06/13/17 0500 06/13/17 0830 06/13/17 0832 06/13/17 0935  BP: 106/77 90/68 97/64    Pulse: 99     Resp: 17     Temp: 98 F (36.7 C)     TempSrc: Oral     SpO2: 97%   96%  Weight: 180 lb (81.6 kg)     Height:       No intake or output data in the 24 hours ending 06/13/17 0947 Filed Weights   06/12/17 2100 06/13/17 0500  Weight: 180 lb (81.6 kg) 180 lb (81.6 kg)   Body mass index is 29.95 kg/m.  General:  Well nourished, well developed, in no acute distress HEENT: normal Lymph: no adenopathy Neck: no JVD Endocrine:  No  thryomegaly Vascular: No carotid bruits; FA pulses 2+ bilaterally without bruits  Cardiac:  normal S1, S2; Irregularly irregular rhythm; no murmur  Lungs:  clear to auscultation bilaterally, no wheezing, rhonchi or rales  Abd: soft, nontender, no hepatomegaly  Ext: no edema Musculoskeletal:  No deformities, BUE and BLE strength normal and equal Skin: warm and dry  Neuro:  CNs 2-12 intact, no focal abnormalities noted Psych:  Normal affect   EKG:  The EKG was personally reviewed and demonstrates:  None available Telemetry:  Telemetry was personally reviewed and demonstrates:  Atrial fibrillation 110's-140  Relevant CV Studies:  TEE pending  Laboratory Data:  Chemistry Recent Labs  Lab 06/13/17 0213  NA 135  K 4.2  CL 98*  CO2 29  GLUCOSE 111*  BUN 14  CREATININE 0.66  CALCIUM 8.7*  GFRNONAA >60  GFRAA >60  ANIONGAP 8    Recent Labs  Lab 06/13/17 0213  PROT 5.5*  ALBUMIN 2.9*  AST 23  ALT 31  ALKPHOS 67  BILITOT 0.9   Hematology Recent Labs  Lab 06/13/17 0213  WBC 8.5  RBC 4.08  HGB 12.3  HCT 37.7  MCV 92.4  MCH 30.1  MCHC 32.6  RDW 15.6*  PLT 170   Cardiac EnzymesNo results for input(s): TROPONINI in the last 168 hours. No results for input(s): TROPIPOC in the last 168 hours.  BNPNo results for input(s): BNP, PROBNP in the last 168 hours.  DDimer No results for input(s): DDIMER in the last 168 hours.  Radiology/Studies:  No results found.  Assessment and Plan:   1. Afib with RVR: New onset, with CHF. Rates difficult to control. Now on metoprolol and digoxin. Meds limited by hypotension. CHA2DS2/VAS Stroke Risk Score is 5  (CHF, HTN, Age (2), female). Eliquis was started on Friday.  Low risk lexiscan at [place]. TSH normal. EF 35-40%, Plan for TEE/DCCV today.   2. Acute CHF: Pt with dyspnea and edema on admission, now improved. Pro-BNP 6440 on 11/10. May be tachycardia induced. Was diuresed 5L. Diuresis currently on hold due to hypotension. Strict  I&O and daily wt. Consider rechecking echo in 3 months of maintenance of sinus rhythm/rate control. Consider starting low dose carvedilol after cardioversion if she is able to tolerate. Consider ACE-I once BP is normalized.   3. Pleural effusion: 750 cc removed on thoracentesis, consistent with CHF.  4. Hyponatremia: On admission, resolving, likely related to volume overload.  5. Transaminitis: CHF vs NASH. Improved.   6. Hypertension: BP has been running soft. Off antihypertensives except BB.    For questions or updates, please contact [place] Please consult [email or web address] for contact  info under Cardiology/STEMI.   Signed, Daune Perch, NP  06/13/2017 9:47 AM  History and all data above reviewed.  Patient examined.  I agree with the findings as above.   The patient presents with SOB and tachycardia.  She has been managed for atrial fib with RVR and newly diagnosed congestive HF.  He has had hypotension with diuresis.  She is referred now for further management.  The patient exam reveals [id number]  ,  Lungs: Clear  ,  Abd: Positive bowel sounds, no rebound no guarding, Ext No edema  .  All available labs, radiology testing, previous records reviewed. Agree with documented assessment and plan. Atrial fib:  Plan TEE DCCV.  Off other meds for now although I will try to titrate beta blockers as her BP allows.  On anticoagulation now.   Cardiomyopathy:  No evidence of ischemia.  Question secondary to atrial fib.  Rhythm control and med titration as tolerated and then relook echo.    Jeneen Rinks Angelee Bahr  11:45 AM  06/13/2017

## 2017-06-13 NOTE — Transfer of Care (Signed)
Immediate Anesthesia Transfer of Care Note  Patient: Stephanie Douglas  Procedure(s) Performed: CARDIOVERSION (N/A ) TRANSESOPHAGEAL ECHOCARDIOGRAM (TEE) (N/A )  Patient Location: PACU and Endoscopy Unit  Anesthesia Type:MAC  Level of Consciousness: awake, oriented, drowsy and patient cooperative  Airway & Oxygen Therapy: Patient Spontanous Breathing and Patient connected to nasal cannula oxygen  Post-op Assessment: Report given to RN and Post -op Vital signs reviewed and stable  SBP 80s; pt arousable, baseline SBP 90s.  Post vital signs: Reviewed and stable  Last Vitals:  Vitals:   06/13/17 1318 06/13/17 1502  BP: 135/90 (!) 85/51  Pulse:  (!) 53  Resp: 15 16  Temp: 36.7 C 36.7 C  SpO2: 99% 94%    Last Pain:  Vitals:   06/13/17 1318  TempSrc: Oral         Complications: No apparent anesthesia complications

## 2017-06-13 NOTE — Progress Notes (Deleted)
Paged MD. Patient has converted to NSR. EKG in chart.

## 2017-06-13 NOTE — Plan of Care (Addendum)
No chest pain this shift. Last VS: 106/77, Resp 14, 97% on 2 L Theodore, HR 110-130 (A. Fib). Pt resting at this time. Bed alarm on. Fall risk bundle in place as pt has history of falls in last 2 months. Will continue to monitor.

## 2017-06-13 NOTE — Progress Notes (Signed)
  Echocardiogram Echocardiogram Transesophageal has been performed.  Jebediah Macrae G Daizy Outen 06/13/2017, 3:17 PM

## 2017-06-13 NOTE — CV Procedure (Signed)
    Transesophageal Echocardiogram Note  Shayla Heming [id number] [birth year]  Procedure: Transesophageal Echocardiogram Indications: Atrial fibrillation   Procedure Details Consent: Obtained Time Out: Verified patient identification, verified procedure, site/side was marked, verified correct patient position, special equipment/implants available, Radiology Safety Procedures followed,  medications/allergies/relevent history reviewed, required imaging and test results available.  Performed  Medications:  Pt sedated by anesthesia with diprovan 130 mg IV total.  Moderate LV dysfunction; biatrial enlargement; no LAA thrombus; moderate to severe TR.  Pt subsequently underwent DCCV with 120 J to sinus rhythm; no immediate complication; continue apixaban.   Complications: No apparent complications Patient did tolerate procedure well.  Kirk Ruths, MD

## 2017-06-13 NOTE — Progress Notes (Signed)
PROGRESS NOTE    Stephanie Douglas  PPI:[id number] DOB: October 09, 1933 DOA: 06/12/2017 PCP: Marco Collie, MD    Brief Narrative:  83 year old female who presented with atrial fibrillation with rapid ventricular response. Patient is known to have hypertension, COPD and depression. She presented to [facility] 7th with history of 3 weeks of worsening lower extremity edema, dizziness and generalized weakness. She was found to be volume overloaded, diagnosed with atrial fibrillation with rapid ventricular response, echocardiography showed ejection fraction 35-40%. Her atrial fibrillation was difficult to control with a AV blockers, she was transferred for further cardiology evaluation, tentative electrical cardioversion. On arrival her blood pressure was 121/81, temperature 98.5, heart rate 139, 129, respiratory rate 12, oxygen saturation 99% on 2 L per nasal cannula. Showed moist mucous membranes, her lungs are clear to auscultation bilaterally, no wheezing or rales rhonchi, heart S1-S2 present irregularly irregular, tachycardic, no gallops or murmurs,abdomen soft nontender, no lower extremity edema.   Patient was admitted to the telemetry unit with the working diagnosis of persistent and refractory atrial fibrillation with rapid ventricular response, complicated by acute systolic heart failure.   Assessment & Plan:   Principal Problem:   Atrial fibrillation with RVR (HCC) Active Problems:   Acute systolic CHF (congestive heart failure) (HCC)   Pleural effusion on right   Hyponatremia   Transaminitis   HTN (hypertension)   COPD (chronic obstructive pulmonary disease) ([place])   1. Atrial fibrillation. Patient underwent successful electrical cardioversion, will continue anticoagulation with apixaban, continue telemetry monitor, keep K at 4 and Mg at 2. Continue with digoxin and metoprolol.   2. Systolic heart failure, acute on chronic. Patient had diuresis on admission to other hospital,  clinically today euvolemic, patient has returned to sinus rhythm. Will continue metoprolol 25 mg bid. Will plan to start ace inh when patient more stable.  3. Hypertension. Will continue metoprolol for blood pressure control, systolic 99 to 063.   4. COPD. Stable with no signs of exacerbation. Continue albuterol and budesonide.    DVT prophylaxis: apixaban  Code Status: full Family Communication: no family at the bedside Disposition Plan: home   Consultants:   Cardiology   Procedures:     Antimicrobials:       Subjective: Patient feeling well post cardioversion, no chest pain, no nausea or vomiting, no dyspnea.   Objective: Vitals:   06/13/17 1000 06/13/17 1318 06/13/17 1502 06/13/17 1515  BP: 106/71 135/90 (!) 85/51 (!) 99/52  Pulse: 90  (!) 53 (!) 55  Resp: 20 15 16 18   Temp:  98 F (36.7 C) 98 F (36.7 C)   TempSrc:  Oral    SpO2: 99% 99% 94% 96%  Weight:      Height:        Intake/Output Summary (Last 24 hours) at 06/13/2017 1516 Last data filed at 06/13/2017 1507 Gross per 24 hour  Intake 500 ml  Output 200 ml  Net 300 ml   Filed Weights   06/12/17 2100 06/13/17 0500  Weight: 81.6 kg (180 lb) 81.6 kg (180 lb)    Examination:   General: Not in pain or dyspnea  Neurology: Awake and alert, non focal  E ENT: mild pallor, no icterus, oral mucosa moist Cardiovascular: No JVD. S1-S2 present, rhythmic, bradycardic, no gallops, rubs, or murmurs. No lower extremity edema. Pulmonary: vesicular breath sounds bilaterally, adequate air movement, no wheezing, rhonchi or rales. Gastrointestinal. Abdomen flat, no organomegaly, non tender, no rebound or guarding Skin. No rashes Musculoskeletal: no joint deformities  Data Reviewed: I have personally reviewed following labs and imaging studies  CBC: Recent Labs  Lab 06/13/17 0213  WBC 8.5  HGB 12.3  HCT 37.7  MCV 92.4  PLT 259   Basic Metabolic Panel: Recent Labs  Lab 06/13/17 0213  NA 135  K  4.2  CL 98*  CO2 29  GLUCOSE 111*  BUN 14  CREATININE 0.66  CALCIUM 8.7*   GFR: Estimated Creatinine Clearance: 58.2 mL/min (by C-G formula based on SCr of 0.66 mg/dL). Liver Function Tests: Recent Labs  Lab 06/13/17 0213  AST 23  ALT 31  ALKPHOS 67  BILITOT 0.9  PROT 5.5*  ALBUMIN 2.9*   No results for input(s): LIPASE, AMYLASE in the last 168 hours. No results for input(s): AMMONIA in the last 168 hours. Coagulation Profile: No results for input(s): INR, PROTIME in the last 168 hours. Cardiac Enzymes: No results for input(s): CKTOTAL, CKMB, CKMBINDEX, TROPONINI in the last 168 hours. BNP (last 3 results) No results for input(s): PROBNP in the last 8760 hours. HbA1C: No results for input(s): HGBA1C in the last 72 hours. CBG: No results for input(s): GLUCAP in the last 168 hours. Lipid Profile: No results for input(s): CHOL, HDL, LDLCALC, TRIG, CHOLHDL, LDLDIRECT in the last 72 hours. Thyroid Function Tests: No results for input(s): TSH, T4TOTAL, FREET4, T3FREE, THYROIDAB in the last 72 hours. Anemia Panel: No results for input(s): VITAMINB12, FOLATE, FERRITIN, TIBC, IRON, RETICCTPCT in the last 72 hours.    Radiology Studies: I have reviewed all of the imaging during this hospital visit personally     Scheduled Meds: . [MAR Hold] apixaban  5 mg Oral BID  . [MAR Hold] atorvastatin  20 mg Oral q1800  . [MAR Hold] budesonide (PULMICORT) nebulizer solution  0.5 mg Nebulization BID  . [MAR Hold] digoxin  0.125 mg Oral Daily  . [MAR Hold] metoprolol tartrate  25 mg Oral BID   Continuous Infusions: . [START ON 06/14/2017] sodium chloride    . lactated ringers 1,000 mL (06/13/17 1320)     LOS: 1 day        Doryce Mcgregory Gerome Apley, MD [facility] Pager [phone]

## 2017-06-13 NOTE — Anesthesia Preprocedure Evaluation (Addendum)
Anesthesia Evaluation  Patient identified by MRN, date of birth, ID band Patient awake    Reviewed: Allergy & Precautions, NPO status , Patient's Chart, lab work & pertinent test results, reviewed documented beta blocker date and time   History of Anesthesia Complications (+) PONV  Airway Mallampati: II  TM Distance: >3 FB Neck ROM: Full    Dental  (+) Dental Advisory Given, Partial Upper, Partial Lower, Missing   Pulmonary COPD,  COPD inhaler,    Pulmonary exam normal breath sounds clear to auscultation       Cardiovascular hypertension, Pt. on home beta blockers and Pt. on medications +CHF  (-) Past MI Normal cardiovascular exam+ dysrhythmias Atrial Fibrillation  Rhythm:Irregular Rate:Tachycardia     Neuro/Psych Depression negative neurological ROS     GI/Hepatic negative GI ROS, Neg liver ROS,   Endo/Other  Obesity  Renal/GU negative Renal ROS  negative genitourinary   Musculoskeletal negative musculoskeletal ROS (+)   Abdominal   Peds  Hematology negative hematology ROS (+)   Anesthesia Other Findings   Reproductive/Obstetrics                            Anesthesia Physical Anesthesia Plan  ASA: III  Anesthesia Plan: MAC   Post-op Pain Management:    Induction: Intravenous  PONV Risk Score and Plan: Propofol infusion and Treatment may vary due to age or medical condition  Airway Management Planned: Nasal Cannula  Additional Equipment: None  Intra-op Plan:   Post-operative Plan:   Informed Consent: I have reviewed the patients History and Physical, chart, labs and discussed the procedure including the risks, benefits and alternatives for the proposed anesthesia with the patient or authorized representative who has indicated his/her understanding and acceptance.   Dental advisory given  Plan Discussed with: CRNA  Anesthesia Plan Comments:         Anesthesia  Quick Evaluation

## 2017-06-14 ENCOUNTER — Encounter: Payer: Self-pay | Admitting: Cardiology

## 2017-06-14 ENCOUNTER — Telehealth: Payer: Self-pay

## 2017-06-14 ENCOUNTER — Encounter: Payer: Self-pay | Admitting: *Deleted

## 2017-06-14 LAB — BASIC METABOLIC PANEL
Anion gap: 7 (ref 5–15)
BUN: 15 mg/dL (ref 6–20)
CO2: 30 mmol/L (ref 22–32)
CREATININE: 0.8 mg/dL (ref 0.44–1.00)
Calcium: 8.7 mg/dL — ABNORMAL LOW (ref 8.9–10.3)
Chloride: 100 mmol/L — ABNORMAL LOW (ref 101–111)
Glucose, Bld: 98 mg/dL (ref 65–99)
POTASSIUM: 4.6 mmol/L (ref 3.5–5.1)
SODIUM: 137 mmol/L (ref 135–145)

## 2017-06-14 LAB — MAGNESIUM: MAGNESIUM: 2 mg/dL (ref 1.7–2.4)

## 2017-06-14 MED ORDER — FUROSEMIDE 20 MG PO TABS: 20.0000 mg | ORAL_TABLET | Freq: Two times a day (BID) | ORAL | 0 refills | Status: DC | PRN

## 2017-06-14 MED ORDER — CARVEDILOL 3.125 MG PO TABS: 3.1250 mg | ORAL_TABLET | Freq: Two times a day (BID) | ORAL | 0 refills | Status: DC

## 2017-06-14 MED ORDER — CARVEDILOL 3.125 MG PO TABS: 3.1250 mg | ORAL_TABLET | Freq: Two times a day (BID) | ORAL | Status: DC

## 2017-06-14 MED ORDER — DIGOXIN 125 MCG PO TABS: 0.1250 mg | ORAL_TABLET | Freq: Every day | ORAL | 0 refills | Status: DC

## 2017-06-14 MED ORDER — APIXABAN 5 MG PO TABS
5.0000 mg | ORAL_TABLET | Freq: Two times a day (BID) | ORAL | 0 refills | Status: DC
Start: 2017-06-14 — End: 2017-07-13

## 2017-06-14 MED ORDER — FUROSEMIDE 20 MG PO TABS: 20.0000 mg | ORAL_TABLET | Freq: Two times a day (BID) | ORAL | Status: DC | PRN

## 2017-06-14 NOTE — Progress Notes (Signed)
Discharge summary reviewed with pt and son Lennette Bihari) at bedside. All questions addressed and no concerns expressed by pt/son at this time. No change since previous assessment. PIV removed. Pt discharged to lobby with son via wheelchair and NT.

## 2017-06-14 NOTE — Progress Notes (Signed)
Progress Note  Patient Name: Stephanie Douglas Date of Encounter: 06/14/2017  Primary Cardiologist: new - Dr. Percival Spanish  Subjective   Patient is feeling well; denies chest pain, SOB, and palpitations. She wishes to discharge home today.  Inpatient Medications    Scheduled Meds: . apixaban  5 mg Oral BID  . atorvastatin  20 mg Oral q1800  . budesonide (PULMICORT) nebulizer solution  0.5 mg Nebulization BID  . digoxin  0.125 mg Oral Daily  . metoprolol tartrate  25 mg Oral BID   Continuous Infusions:  PRN Meds: acetaminophen **OR** acetaminophen, albuterol, guaiFENesin-dextromethorphan, ondansetron **OR** ondansetron (ZOFRAN) IV, polyethylene glycol   Vital Signs    Vitals:   06/13/17 1921 06/13/17 2055 06/13/17 2135 06/14/17 0415  BP: 107/66  130/82 (!) 105/53  Pulse: 66  69 68  Resp: 15   14  Temp: 97.7 F (36.5 C)   98.2 F (36.8 C)  TempSrc: Oral   Oral  SpO2: 97% 97%  97%  Weight:    178 lb 14.4 oz (81.1 kg)  Height:        Intake/Output Summary (Last 24 hours) at 06/14/2017 0734 Last data filed at 06/14/2017 0400 Gross per 24 hour  Intake 500 ml  Output 1100 ml  Net -600 ml   Filed Weights   06/12/17 2100 06/13/17 0500 06/14/17 0415  Weight: 180 lb (81.6 kg) 180 lb (81.6 kg) 178 lb 14.4 oz (81.1 kg)     Physical Exam   General: Well developed, well nourished, female appearing in no acute distress. Head: Normocephalic, atraumatic.  Neck: Supple without bruits, JVD Lungs:  Resp regular and unlabored, CTA. Heart: RRR, S1, S2, no S3, S4, or murmur; no rub. Abdomen: Soft, non-tender, non-distended with normoactive bowel sounds. No hepatomegaly. No rebound/guarding. No obvious abdominal masses. Extremities: No clubbing, cyanosis, trace edema. Distal pedal pulses are 2+ bilaterally. Neuro: Alert and oriented X 3. Moves all extremities spontaneously. Psych: Normal affect.  Labs    Chemistry Recent Labs  Lab 06/13/17 0213 06/14/17 0350  NA 135 137  K  4.2 4.6  CL 98* 100*  CO2 29 30  GLUCOSE 111* 98  BUN 14 15  CREATININE 0.66 0.80  CALCIUM 8.7* 8.7*  PROT 5.5*  --   ALBUMIN 2.9*  --   AST 23  --   ALT 31  --   ALKPHOS 67  --   BILITOT 0.9  --   GFRNONAA >60 >60  GFRAA >60 >60  ANIONGAP 8 7     Hematology Recent Labs  Lab 06/13/17 0213  WBC 8.5  RBC 4.08  HGB 12.3  HCT 37.7  MCV 92.4  MCH 30.1  MCHC 32.6  RDW 15.6*  PLT 170    Cardiac EnzymesNo results for input(s): TROPONINI in the last 168 hours. No results for input(s): TROPIPOC in the last 168 hours.   BNPNo results for input(s): BNP, PROBNP in the last 168 hours.   DDimer No results for input(s): DDIMER in the last 168 hours.   Radiology    No results found.   Telemetry    NSR - Personally Reviewed  ECG    NSR - Personally Reviewed   Cardiac Studies   TEE/DCCV 06/13/17: Moderate LV dysfunction; biatrial enlargement; no LAA thrombus; moderate to severe TR. Pt subsequently underwent DCCV with 120 J to sinus rhythm; no immediate complication; continue apixaban.  Study Conclusions - Left ventricle: Systolic function was moderately reduced. The   estimated ejection fraction was in  the range of 35% to 40%.   Diffuse hypokinesis. - Aortic valve: There was trivial regurgitation. - Mitral valve: There was mild regurgitation. - Left atrium: The atrium was moderately dilated. No evidence of   thrombus in the atrial cavity or appendage. - Right ventricle: Systolic function was mildly reduced. - Right atrium: The atrium was mildly dilated. - Atrial septum: There was a patent foramen ovale. There was an   atrial septal aneurysm. - Tricuspid valve: There was moderate-severe regurgitation.  Impressions: - Successful cardioversion.  Patient Profile     83 y.o. female with a hx of HTN who is being seen for atrial fib with RVR and CHF.   Assessment & Plan    1. Atrial fibrillation s/p TEE/DCCV to NSR on 06/13/17 - continue eliquis for at least  30 days This patients CHA2DS2-VASc Score and unadjusted Ischemic Stroke Rate (% per year) is equal to 7.2 % stroke rate/year from a score of 5 (CHF, HTN, AGE, female)   2. New onset systolic heart failure - EF of 35-40% - low risk myoview completed at [facility] - diuretic on hold for hypotension yesterday - overall net negative 600 cc with 1.1 L urine output yesterday - repeat echo in 3 months - plan to transition lopressor to coreg for heart failure and start ARB as pressure allows   3. Hypotension - pt hypotensive in the 80-100s yesterday, marginal pressures today - pressures slightly improved today now that she is no longer NPO - consider discharging on 20 mg lasix - will discuss with attending   Follow up appt made and in Epic.  History and all data above reviewed.  Patient examined.  I agree with the findings as above.  No chest pain.  No SOB.  She is anxious to go home.   The patient exam reveals COR:  Regular  ,  Lungs: Clear  ,  Abd: Positive bowel sounds, no rebound no guarding, Ext  No edema  .  All available labs, radiology testing, previous records reviewed. Agree with documented assessment and plan. Atrial fib:  Now s/pt TEE/DCCV.  Home on meds as above.  I changed to Coreg.  CM:  euvolemic and should go home with PRN Lasix.  We will follow for med titration.   TR:  Moderate/severe:  This can be followed as an outpatient.     Jeneen Rinks Fartun Paradiso  9:55 AM  06/14/2017  Signed, Tami Lin Duke , PA-C 7:34 AM 06/14/2017 Pager: [phone]

## 2017-06-14 NOTE — Care Management (Signed)
Stephanie Douglas was previously started at [facility]. New Rx for Stephanie Douglas faxed to Pharmacy.  CM did call [facility] to see what co pay would be and the medication is in stock. Co pay will be $45.00-Pt is aware and 30 day free card provided. PTA from home with family support. Plan to return home 06-14-17. Bethena Roys, RN,BSN [phone]

## 2017-06-14 NOTE — Discharge Summary (Addendum)
Physician Discharge Summary  Stephanie Douglas  VOH:[id number]  DOB: May 24, 1934  DOA: 06/12/2017 PCP: Marco Collie, MD  Admit date: 06/12/2017 Discharge date: 06/14/2017  Admitted From: Home  Disposition: Home   Recommendations for Outpatient Follow-up:  1. Follow up with PCP in 1-2 weeks 2. Follow up with cardiology - appointment on 06/20/17  3. Please obtain BMP/CBC in one week to monitor Hgb and renal function   Discharge Condition: Stable  CODE STATUS: Full Code  Diet recommendation: Heart Healthy   Brief/Interim Summary: 83 year old female who presented with atrial fibrillation with rapid ventricular response. Patient is known to have hypertension, COPD and depression. She presented to [facility] November 7th with history of 3 weeks of worsening lower extremity edema, dizziness and generalized weakness. She was found to be volume overloaded, diagnosed with atrial fibrillation with rapid ventricular response, echocardiography showed ejection fraction 35-40%. Her atrial fibrillation was difficult to control with a AV blockers, she was transferred for further cardiology evaluation, tentative electrical cardioversion. On arrival her blood pressure was 121/81, temperature 98.5, heart rate 139, 129, respiratory rate 12, oxygen saturation 99% on 2 L per nasal cannula. Showed moist mucous membranes, her lungs are clear to auscultation bilaterally, no wheezing or rales rhonchi, heart S1-S2 present irregularly irregular, tachycardic, no gallops or murmurs,abdomen soft nontender, no lower extremity edema.   Patient was admitted to the telemetry unit with the working diagnosis of persistent and refractory atrial fibrillation with rapid ventricular response, complicated by acute systolic heart failure  Subjective: Patient seen and examined she is s/p cardioversion, now feeling much better. Breathing back to baseline. Denies chest pain, SOB and dizziness. No acute events overnight.   Discharge  Diagnoses/Hospital Course:  A fib  Initially uncontrolled with medication, subsequently, patient underwent successful cardioversion under TEE. Patient was placed on Eliquis, will continue BID. Patient is schedule to follow with cardiology on  06/20/17 Patient was placed on Coreg and Digoxin   Acute on Chronic Systolic CHF  Patient was diuresed with IV Lasix at outside hospital, upon discharge patient is euvolemic. Patient palced on Coreg 3.125 mg BID. Will d/c on Lasix 20 mg PRN leg swelling. May need to start ACE/ARB as outpatient  HTN  BP stable during hospital stay.  Amlodipine has been discontinued, Continue coreg  Follow up with PCP   COPD  Stable  Continue to monitor   All other chronic medical condition were stable during the hospitalization.  On the day of the discharge the patient's vitals were stable, and no other acute medical condition were reported by patient. the patient was felt safe to be discharge to home   Discharge Instructions  You were cared for by a hospitalist during your hospital stay. If you have any questions about your discharge medications or the care you received while you were in the hospital after you are discharged, you can call the unit and asked to speak with the hospitalist on call if the hospitalist that took care of you is not available. Once you are discharged, your primary care physician will handle any further medical issues. Please note that NO REFILLS for any discharge medications will be authorized once you are discharged, as it is imperative that you return to your primary care physician (or establish a relationship with a primary care physician if you do not have one) for your aftercare needs so that they can reassess your need for medications and monitor your lab values.  Discharge Instructions    Call MD for:  difficulty  breathing, headache or visual disturbances   Complete by:  As directed    Call MD for:  extreme fatigue   Complete by:  As  directed    Call MD for:  hives   Complete by:  As directed    Call MD for:  persistant dizziness or light-headedness   Complete by:  As directed    Call MD for:  persistant nausea and vomiting   Complete by:  As directed    Call MD for:  redness, tenderness, or signs of infection (pain, swelling, redness, odor or green/yellow discharge around incision site)   Complete by:  As directed    Call MD for:  severe uncontrolled pain   Complete by:  As directed    Call MD for:  temperature >100.4   Complete by:  As directed    Diet - low sodium heart healthy   Complete by:  As directed    Increase activity slowly   Complete by:  As directed      Allergies as of 06/14/2017   No Known Allergies     Medication List    STOP taking these medications   amLODipine 5 MG tablet Commonly known as:  NORVASC     TAKE these medications   apixaban 5 MG Tabs tablet Commonly known as:  ELIQUIS Take 1 tablet (5 mg total) 2 (two) times daily by mouth.   atorvastatin 20 MG tablet Commonly known as:  LIPITOR Take 20 mg daily by mouth.   carvedilol 3.125 MG tablet Commonly known as:  COREG Take 1 tablet (3.125 mg total) 2 (two) times daily with a meal by mouth.   digoxin 0.125 MG tablet Commonly known as:  LANOXIN Take 1 tablet (0.125 mg total) daily by mouth.   escitalopram 10 MG tablet Commonly known as:  LEXAPRO Take 10 mg daily by mouth.   furosemide 20 MG tablet Commonly known as:  LASIX Take 1 tablet (20 mg total) 2 (two) times daily as needed by mouth for fluid or edema.      Follow-up Information    Marco Collie, MD. Schedule an appointment as soon as possible for a visit in 1 week(s).   Specialty:  Family Medicine Why:  Hospital follow up  Contact information: [place] [address] [phone]          No Known Allergies  Consultations:  Cardiology   Procedures/Studies: TEE   06/13/17 ------------------------------------------------------------------- Study Conclusions  - Left ventricle: Systolic function was moderately reduced. The   estimated ejection fraction was in the range of 35% to 40%.   Diffuse hypokinesis. - Aortic valve: There was trivial regurgitation. - Mitral valve: There was mild regurgitation. - Left atrium: The atrium was moderately dilated. No evidence of   thrombus in the atrial cavity or appendage. - Right ventricle: Systolic function was mildly reduced. - Right atrium: The atrium was mildly dilated. - Atrial septum: There was a patent foramen ovale. There was an   atrial septal aneurysm. - Tricuspid valve: There was moderate-severe regurgitation.  Impressions:  - Successful cardioversion  Discharge Exam: Vitals:   06/13/17 2135 06/14/17 0415  BP: 130/82 (!) 105/53  Pulse: 69 68  Resp:  14  Temp:  98.2 F (36.8 C)  SpO2:  97%   Vitals:   06/13/17 1921 06/13/17 2055 06/13/17 2135 06/14/17 0415  BP: 107/66  130/82 (!) 105/53  Pulse: 66  69 68  Resp: 15   14  Temp: 97.7  F (36.5 C)   98.2 F (36.8 C)  TempSrc: Oral   Oral  SpO2: 97% 97%  97%  Weight:    81.1 kg (178 lb 14.4 oz)  Height:        General: Pt is alert, awake, not in acute distress Cardiovascular: RRR, S1/S2 +, no rubs, no gallops Respiratory: CTA bilaterally, no wheezing, no rhonchi Abdominal: Soft, NT, ND, bowel sounds + Extremities: no edema, no cyanosis  The results of significant diagnostics from this hospitalization (including imaging, microbiology, ancillary and laboratory) are listed below for reference.     Microbiology: No results found for this or any previous visit (from the past 240 hour(s)).   Labs: BNP (last 3 results) No results for input(s): BNP in the last 8760 hours. Basic Metabolic Panel: Recent Labs  Lab 06/13/17 0213 06/14/17 0350  NA 135 137  K 4.2 4.6  CL 98* 100*  CO2 29 30  GLUCOSE 111* 98  BUN 14 15   CREATININE 0.66 0.80  CALCIUM 8.7* 8.7*  MG  --  2.0   Liver Function Tests: Recent Labs  Lab 06/13/17 0213  AST 23  ALT 31  ALKPHOS 67  BILITOT 0.9  PROT 5.5*  ALBUMIN 2.9*   No results for input(s): LIPASE, AMYLASE in the last 168 hours. No results for input(s): AMMONIA in the last 168 hours. CBC: Recent Labs  Lab 06/13/17 0213  WBC 8.5  HGB 12.3  HCT 37.7  MCV 92.4  PLT 170   Cardiac Enzymes: No results for input(s): CKTOTAL, CKMB, CKMBINDEX, TROPONINI in the last 168 hours. BNP: Invalid input(s): POCBNP CBG: No results for input(s): GLUCAP in the last 168 hours. D-Dimer No results for input(s): DDIMER in the last 72 hours. Hgb A1c No results for input(s): HGBA1C in the last 72 hours. Lipid Profile No results for input(s): CHOL, HDL, LDLCALC, TRIG, CHOLHDL, LDLDIRECT in the last 72 hours. Thyroid function studies No results for input(s): TSH, T4TOTAL, T3FREE, THYROIDAB in the last 72 hours.  Invalid input(s): FREET3 Anemia work up No results for input(s): VITAMINB12, FOLATE, FERRITIN, TIBC, IRON, RETICCTPCT in the last 72 hours. Urinalysis No results found for: COLORURINE, APPEARANCEUR, LABSPEC, [place], GLUCOSEU, HGBUR, BILIRUBINUR, KETONESUR, PROTEINUR, UROBILINOGEN, NITRITE, LEUKOCYTESUR Sepsis Labs Invalid input(s): PROCALCITONIN,  WBC,  LACTICIDVEN Microbiology No results found for this or any previous visit (from the past 240 hour(s)).   Time coordinating discharge: 32 minutes  SIGNED:  Chipper Oman, MD  [facility] 06/14/2017, 11:09 AM  Pager please text page via  [email or web address] Password [email or web address]

## 2017-06-14 NOTE — Plan of Care (Signed)
Patient remains in NSR

## 2017-06-14 NOTE — Telephone Encounter (Signed)
Left message to return call on home number for TCM call.

## 2017-06-14 NOTE — Consult Note (Signed)
[facility] CM Primary Care Navigator  06/14/2017  Stephanie Douglas 1933-09-04 [id number]   Met withpatientand son [name]) at the bedside to identify possible discharge needs.  Patientreports having"difficulty breathing" and lower extremity swelling that had led to this admission.  Patient end[facility] she will be seeing after discharge.   Patient shared using [name] in [place] obtain medications without difficulty.   Patient's son and daughter [name] will bemanaging hermedications at home with use of "pill box" system filled weekly.   Her son reports that patient was driving prior to admission but after discharge, son and daughter will beprovidingtransportation to her doctors'appointments.  Patient's son lives two houses away from her. Son and daughter in-law [name] will be her primary caregivers at home as stated.  Anticipated discharge planis homeper patient and RN report.   Patientand son voiced understanding to call herprimary care provider's officewhenshe gets home,for a post discharge follow-upwithin a week or sooner if needs arise.Patient letter (with PCP's contact number) was provided asa reminder.  During this hospitalization, patient was admitted with the working diagnosis of persistent and refractory atrial fibrillation with rapid ventricular response, complicated by acute systolic heart failure. COPD was stable per MD note.  Explained to patient about [facility] CM services available for health management at home.Patient has a weighing scale at home and was encouraged to monitor and record daily weights.  Patient voiced preference and verbally agreed for [facility] HF callsover home visitsto follow-up with herrecovery at home.Son's wife is a Marine scientist and will be assisting in managing health issues at home per patient.   Referral was made for EMMI  HFcalls after discharge.  Patient expressed understanding to seekreferral to [facility] care managementfrom primary care provider if deemed necessary forany furtherservicesneeded in the future.   [facility] care management information provided for future needs that may arise.   For additional questions please contact:  Edwena Felty A. Navy Rothschild, BSN, RN-BC [facility] PRIMARY CARE Navigator Cell: [phone]

## 2017-06-15 ENCOUNTER — Encounter (HOSPITAL_COMMUNITY): Payer: Self-pay | Admitting: Cardiology

## 2017-06-15 NOTE — Telephone Encounter (Signed)
Attempted to call patient for TCM call. No answer/busy signal. Will continue efforts.

## 2017-06-16 NOTE — Telephone Encounter (Addendum)
Patient contacted regarding discharge from [facility] then transferred to [facility] on 06/14/17.  Patient understands to follow up with provider Dr. Bettina Gavia on 06/20/17 at 1:40 pm at [facility]. Patient understands discharge instructions? Patient states "I reckon, my children have the paperwork" Patient understands medications and regiment? Yes, her children fixed her medications for her when she got home. Patient understands to bring all medications to this visit? Yes  No further questions or concerns.

## 2017-06-19 DIAGNOSIS — I071 Rheumatic tricuspid insufficiency: Secondary | ICD-10-CM | POA: Insufficient documentation

## 2017-06-19 DIAGNOSIS — Z7901 Long term (current) use of anticoagulants: Secondary | ICD-10-CM | POA: Insufficient documentation

## 2017-06-19 NOTE — Progress Notes (Signed)
Cardiology Office Note:    Date:  06/20/2017   ID:  Stephanie Douglas, DOB 17-Mar-1934, MRN [id number]  PCP:  Lillard Anes, MD  Cardiologist:  Shirlee More, MD   Referring MD: Dr Henrene Pastor  ASSESSMENT:    1. Paroxysmal atrial fibrillation (HCC)   2. Non-rheumatic tricuspid valve insufficiency   3. Chronic anticoagulation   4. Acute systolic CHF (congestive heart failure) ([place])   5. Hypertensive heart disease with heart failure ([place])    PLAN:    In order of problems listed above:  1. Improved maintaining sinus rhythm.  At this point time I would not start amiodarone but if she had recurrent I think to be at the appropriate medication.  She will continue anticoagulation along with beta-blocker 2. Stable no evidence of right heart failure 3. Stable continue current anticoagulant 4. Heart failure stable compensated continue current diuretics sodium restriction continue beta-blocker and start ARNI.  Will address MRA at her next visit.  Recheck echocardiogram in 3 months. 5. Stable continue current diuretic antihypertensives and start ARNI  Next appointment 3 weeks   Medication Adjustments/Labs and Tests Ordered: Current medicines are reviewed at length with the patient today.  Concerns regarding medicines are outlined above.  Orders Placed This Encounter  Procedures  . Basic metabolic panel  . Pro b natriuretic peptide (BNP)  . EKG 12-Lead   Meds ordered this encounter  Medications  . sacubitril-valsartan (ENTRESTO) 24-26 MG    Sig: Take 1 tablet by mouth 2 (two) times daily.    Dispense:  60 tablet    Refill:  3     Chief Complaint  Patient presents with  . Hospitalization Follow-up    History of Present Illness:    Stephanie Douglas is a 83 y.o. female with recent [facility] admission discharged 06/14/17 who is being seen today for the evaluation of atrial fibrillation at the request of Marco Collie, MD. She has [facility] Henrene Pastor.  In  retrospect she has never been well since she had a flulike illness in the spring but in the last month she is aware of her heart beating irregularly increasing shortness of breath.  This culminated with admission to the hospital with acute decompensated  systolic heart failure and rapid atrial fibrillation.  Rate was unable to be controlled and subsequently had a TEE guided cardioversion opposes con hospital successfully.  She was anticoagulated but not placed on antiarrhythmic drug and discharged on guideline directed therapy although she is on digoxin stopped today and not on ACE arbor ARNI and is started on Entresto.  She is improved but is still fatigued not ready [facility]  She has no shortness of breath edema palpitations chest pain no bleeding complications of her anticoagulant her weight is down 10 pounds and heart rate and blood pressure steady checking at home. Discharge Diagnoses/Hospital Course:  A fib  Initially uncontrolled with medication, subsequently, patient underwent successful cardioversion under TEE. Patient was placed on Eliquis, will continue BID. Patient is schedule to follow with cardiology on  06/20/17 Patient was placed on Coreg and Digoxin  Acute on Chronic Systolic CHF  Patient was diuresed with IV Lasix at outside hospital, upon discharge patient is euvolemic. Patient palced on Coreg 3.125 mg BID. Will d/c on Lasix 20 mg PRN leg swelling. May need to start ACE/ARB as outpatient HTN  BP stable during hospital stay.  Amlodipine has been discontinued, Continue coreg  Follow  up with PCP  COPD  Stable     Past Medical History:  Diagnosis Date  . COPD (chronic obstructive pulmonary disease) ([place])   . Depression   . HTN (hypertension)   . PONV (postoperative nausea and vomiting)     Past Surgical History:  Procedure Laterality Date  . ABDOMINAL HYSTERECTOMY    . CARDIOVERSION N/A 06/13/2017   Procedure: CARDIOVERSION;  Surgeon:  Lelon Perla, MD;  Location: [facility] ENDOSCOPY;  Service: Cardiovascular;  Laterality: N/A;  . TEE WITHOUT CARDIOVERSION N/A 06/13/2017   Procedure: TRANSESOPHAGEAL ECHOCARDIOGRAM (TEE);  Surgeon: Lelon Perla, MD;  Location: [facility] ENDOSCOPY;  Service: Cardiovascular;  Laterality: N/A;    Current Medications: Current Meds  Medication Sig  . apixaban (ELIQUIS) 5 MG TABS tablet Take 1 tablet (5 mg total) 2 (two) times daily by mouth.  Marland Kitchen atorvastatin (LIPITOR) 20 MG tablet Take 20 mg daily by mouth.  . budesonide-formoterol (SYMBICORT) 160-4.5 MCG/ACT inhaler Inhale 2 puffs 2 (two) times daily into the lungs.  Marland Kitchen CALCIUM ASCORBATE PO Take daily by mouth.  . carvedilol (COREG) 3.125 MG tablet Take 1 tablet (3.125 mg total) 2 (two) times daily with a meal by mouth.  . escitalopram (LEXAPRO) 10 MG tablet Take 10 mg by mouth at bedtime.  . furosemide (LASIX) 20 MG tablet Take 1 tablet (20 mg total) 2 (two) times daily as needed by mouth for fluid.  Marland Kitchen loratadine (CLARITIN) 10 MG tablet Take 10 mg daily as needed by mouth for allergies.  Marland Kitchen ondansetron (ZOFRAN) 4 MG tablet Take 4 mg every 8 (eight) hours as needed by mouth for nausea or vomiting.  . polyethylene glycol (MIRALAX / GLYCOLAX) packet Take 17 g daily as needed by mouth.  . ranitidine (ZANTAC) 150 MG tablet Take 150 mg daily as needed by mouth for heartburn.  . [DISCONTINUED] digoxin (LANOXIN) 0.125 MG tablet Take 1 tablet (0.125 mg total) daily by mouth.     Allergies:   Patient has no known allergies.   Social History   Socioeconomic History  . Marital status: Unknown    Spouse name: None  . Number of children: None  . Years of education: None  . Highest education level: None  Social Needs  . Financial resource strain: None  . Food insecurity - worry: None  . Food insecurity - inability: None  . Transportation needs - medical: None  . Transportation needs - non-medical: None  Occupational History  . None  Tobacco Use  .  Smoking status: Never Smoker  . Smokeless tobacco: Never Used  Substance and Sexual Activity  . Alcohol use: No    Frequency: Never  . Drug use: No  . Sexual activity: None  Other Topics Concern  . None  Social History Narrative  . None     Family History: The patient's family history includes AAA (abdominal aortic aneurysm) in her sister; Asthma in her father; Breast cancer in her sister; CAD in her mother; Cardiomyopathy in her mother; Diabetes in her brother and mother; Hypertension in her brother.  ROS:   ROS Please see the history of present illness.     All other systems reviewed and are negative.  EKGs/Labs/Other Studies Reviewed:     EKG:  EKG is  ordered today.  The ekg ordered today demonstrates [facility] Normal EKG  TEE: Study Conclusions - Left ventricle: Systolic function was moderately reduced. The   estimated ejection fraction was in the range of 35% to 40%.   Diffuse hypokinesis. -  Aortic valve: There was trivial regurgitation. - Mitral valve: There was mild regurgitation. - Left atrium: The atrium was moderately dilated. No evidence of   thrombus in the atrial cavity or appendage. - Right ventricle: Systolic function was mildly reduced. - Right atrium: The atrium was mildly dilated. - Atrial septum: There was a patent foramen ovale. There was an   atrial septal aneurysm. - Tricuspid valve: There was moderate-severe regurgitation. - Successful cardioversion.  Recent Labs: 06/13/2017: ALT 31; Hemoglobin 12.3; Platelets 170 06/14/2017: BUN 15; Creatinine, Ser 0.80; Magnesium 2.0; Potassium 4.6; Sodium 137  Recent Lipid Panel No results found for: CHOL, TRIG, HDL, CHOLHDL, VLDL, LDLCALC, LDLDIRECT  Physical Exam:    VS:  BP 130/80 (BP Location: Left Arm, Patient Position: Sitting, Cuff Size: Normal)   Pulse 73   Ht 5\' 5"  (1.651 m)   Wt 172 lb (78 kg)   SpO2 93%   BMI 28.62 kg/m     Wt Readings from Last 3 Encounters:  06/20/17 172 lb (78 kg)    06/14/17 178 lb 14.4 oz (81.1 kg)     GEN:  Well nourished, well developed in no acute distress HEENT: Normal NECK: No JVD; No carotid bruits LYMPHATICS: No lymphadenopathy CARDIAC: RRR, no murmurs, rubs, gallops RESPIRATORY:  Clear to auscultation without rales, wheezing or rhonchi  ABDOMEN: Soft, non-tender, non-distended MUSCULOSKELETAL:  No edema; No deformity  SKIN: Warm and dry NEUROLOGIC:  Alert and oriented x 3 PSYCHIATRIC:  Normal affect     Signed, Shirlee More, MD  06/20/2017 2:12 PM    [facility]

## 2017-06-20 ENCOUNTER — Ambulatory Visit (INDEPENDENT_AMBULATORY_CARE_PROVIDER_SITE_OTHER): Payer: PPO | Admitting: Cardiology

## 2017-06-20 ENCOUNTER — Encounter: Payer: Self-pay | Admitting: Cardiology

## 2017-06-20 VITALS — BP 130/80 | HR 73 | Ht 65.0 in | Wt 172.0 lb

## 2017-06-20 DIAGNOSIS — I480 Paroxysmal atrial fibrillation: Secondary | ICD-10-CM | POA: Diagnosis not present

## 2017-06-20 DIAGNOSIS — I5021 Acute systolic (congestive) heart failure: Secondary | ICD-10-CM | POA: Diagnosis not present

## 2017-06-20 DIAGNOSIS — I110 Hypertensive heart disease with heart failure: Secondary | ICD-10-CM

## 2017-06-20 DIAGNOSIS — Z7901 Long term (current) use of anticoagulants: Secondary | ICD-10-CM | POA: Diagnosis not present

## 2017-06-20 DIAGNOSIS — I361 Nonrheumatic tricuspid (valve) insufficiency: Secondary | ICD-10-CM | POA: Diagnosis not present

## 2017-06-20 MED ORDER — SACUBITRIL-VALSARTAN 24-26 MG PO TABS: 1.0000 | ORAL_TABLET | Freq: Two times a day (BID) | ORAL | 3 refills | Status: DC

## 2017-06-20 NOTE — Patient Instructions (Addendum)
Medication Instructions:  Your physician has recommended you make the following change in your medication:  STOP digoxin START sacubitril-valsartan (Entresto) 24-26 mg twice daily  Labwork: Your physician recommends that you return for lab work on Monday 06/26/17. You may go to the [facility] in [place] or this office.  Testing/Procedures: You had an EKG today.  Follow-Up: Your physician recommends that you schedule a follow-up appointment in: 3 weeks.  Any Other Special Instructions Will Be Listed Below (If Applicable).     If you need a refill on your cardiac medications before your next appointment, please call your pharmacy.    KNOW YOUR HEART FAILURE ZONES  Green Zone (Your Goal):  Marland Kitchen No shortness of breath or trouble bleeding . No weight gain of more than 3 pounds in one day or 5 pounds in a week . No swelling in your ankles, feet, stomach, or hands . No chest discomfort, heaviness or pain  Yellow Zone (Call the Doctor to get help!): Having 1 or more of the following: . Weight gain of 3 pounds in 1 day or 5 pounds in 1 week . More swelling of your feet, ankles, stomach, or hands . It is harder to breath when lying down. You need to sit up . Chest discomfort, heaviness, or pain . You feel more tired or have less energy than normal . New or worsening dizziness . Dry hacking cough . You feel uneasy and you know something is not right  Red Zone (Call 911-EMERGENCY): . Struggling to breathe. This does not go away when you sit up. . Stronger and more regular amounts of chest discomfort . New confusion or can't think clearly . Fainting or near fainting   Resources form the American heart Association: https://[place]-galvan.org/    KardiaMobile Https://store.alivecor.com/products/kardiamobile        FDA-cleared, clinical grade mobile EKG monitor: Jodelle Red is the most  clinically-validated mobile EKG used by the world's leading cardiac care medical professionals With Basic service, know instantly if your heart rhythm is normal or if atrial fibrillation is detected, and email the last single EKG recording to yourself or your doctor Premium service, available for purchase through the Kardia app for $9.99 per month or $99 per year, includes unlimited history and storage of your EKG recordings, a monthly EKG summary report to share with your doctor, along with the ability to track your blood pressure, activity and weight Includes one KardiaMobile phone clip FREE SHIPPING: Standard delivery 1-3 business days. Orders placed by 11:00am PST will ship that afternoon. Otherwise, will ship next business day. All orders ship via [employer] from [place], [place]

## 2017-06-22 ENCOUNTER — Telehealth: Payer: Self-pay | Admitting: Nurse Practitioner

## 2017-06-22 NOTE — Telephone Encounter (Signed)
   Ms. Heatherington was started on [facility] 11/20.  She did well on 11/21, but this AM, shortly after taking meds, she felt lightheaded and weak.  Dtr in law checked BP and noted it to be 68/50.  Pt currently lying down.  With a blood pressure that low, I recommended that they call EMS for further evaluation and potential transfer to hospital as she may require IV fluids to get her pressure back up.  Her daughter-in-law plans to try some p.o. fluids at home.  I advised that if blood pressure does not come up within the next 30-40 minutes, or if symptoms persist/worsen, they should have a very low threshold to call 911.  If her pressure does come up and she is feeling better and they choose not to come to the emergency room, she should not take her p.m. dose of Entresto today   And should check her blood pressure in the morning prior to a.m. medications and hold Entresto if systolic is less than 492, as she appears to be very sensitive to this.  Caller verbalized understanding and was grateful for the call back.  Murray Hodgkins, NP 06/22/2017, 10:15 AM

## 2017-06-23 ENCOUNTER — Telehealth: Payer: Self-pay | Admitting: Physician Assistant

## 2017-06-23 NOTE — Telephone Encounter (Signed)
Patient's daughter-in-law went to check on her today.  Yesterday, she had problems with hypotension after taking her morning medications which include Lasix 20 mg and Entresto 24/26 which is new.  Systolic blood pressure 1 point was in the 60s.  She called the service and held the [place] last p.m. as directed.  This a.m., she took the [place] along with her other morning medications and once again feels extremely weak.  Systolic blood pressures now in the 80s.  Upon review of her discharge medication list, she was supposed to take the Lasix 20 mg twice daily as needed for weight gain or edema.  However, she has been taking it twice daily every day.  Recommended that she change the Lasix to as needed only for weight gain of 3 pounds in a day or 5 pounds in a week or if she wakes with lower extremity edema.  Recommended that she hold the [facility] for now until we can figure out if changing the Lasix to as needed we will allow her blood pressure to be high enough to tolerate it.  Keep follow-up with Dr. Bettina Gavia on 12/11.  Rosaria Ferries, PA-C 06/23/2017 12:09 PM Beeper [phone]

## 2017-06-26 ENCOUNTER — Telehealth: Payer: Self-pay | Admitting: Cardiology

## 2017-06-26 ENCOUNTER — Telehealth: Payer: Self-pay

## 2017-06-26 NOTE — Telephone Encounter (Signed)
Advised to hold Entresto due to low blood pressure per Dr. Bettina Gavia until follow up appointment 07/11/17. Verbalized understanding, no further questions.

## 2017-06-26 NOTE — Telephone Encounter (Signed)
Advised patient to still have lab work checked today. Verbalized understanding, no further questions.

## 2017-06-26 NOTE — Telephone Encounter (Signed)
We ordered BMP, BNP after starting her on Entresto last week. Stephanie Douglas was stopped over the weekend. Do you still want to have lab work checked?

## 2017-06-26 NOTE — Telephone Encounter (Signed)
Wants to know if she still needs to get her lab work done

## 2017-06-26 NOTE — Telephone Encounter (Signed)
Left message to return call 

## 2017-06-26 NOTE — Telephone Encounter (Signed)
yes

## 2017-06-27 DIAGNOSIS — I110 Hypertensive heart disease with heart failure: Secondary | ICD-10-CM | POA: Diagnosis not present

## 2017-06-27 DIAGNOSIS — I480 Paroxysmal atrial fibrillation: Secondary | ICD-10-CM | POA: Diagnosis not present

## 2017-06-27 DIAGNOSIS — I5021 Acute systolic (congestive) heart failure: Secondary | ICD-10-CM | POA: Diagnosis not present

## 2017-06-28 LAB — BASIC METABOLIC PANEL
BUN/Creatinine Ratio: 18 (ref 12–28)
BUN: 16 mg/dL (ref 8–27)
CALCIUM: 9.4 mg/dL (ref 8.7–10.3)
CHLORIDE: 105 mmol/L (ref 96–106)
CO2: 24 mmol/L (ref 20–29)
Creatinine, Ser: 0.88 mg/dL (ref 0.57–1.00)
GFR calc non Af Amer: 62 mL/min/{1.73_m2} (ref >59)
GFR, EST AFRICAN AMERICAN: 71 mL/min/{1.73_m2} (ref >59)
GLUCOSE: 119 mg/dL — AB (ref 65–99)
POTASSIUM: 4.6 mmol/L (ref 3.5–5.2)
Sodium: 143 mmol/L (ref 134–144)

## 2017-06-28 LAB — PRO B NATRIURETIC PEPTIDE: NT-PRO BNP: 8089 pg/mL — AB (ref 0–738)

## 2017-06-29 DIAGNOSIS — I480 Paroxysmal atrial fibrillation: Secondary | ICD-10-CM | POA: Diagnosis not present

## 2017-06-29 DIAGNOSIS — I10 Essential (primary) hypertension: Secondary | ICD-10-CM | POA: Diagnosis not present

## 2017-06-29 DIAGNOSIS — I110 Hypertensive heart disease with heart failure: Secondary | ICD-10-CM | POA: Diagnosis not present

## 2017-06-29 DIAGNOSIS — I5021 Acute systolic (congestive) heart failure: Secondary | ICD-10-CM | POA: Diagnosis not present

## 2017-06-29 DIAGNOSIS — E782 Mixed hyperlipidemia: Secondary | ICD-10-CM | POA: Diagnosis not present

## 2017-06-29 DIAGNOSIS — J449 Chronic obstructive pulmonary disease, unspecified: Secondary | ICD-10-CM | POA: Diagnosis not present

## 2017-07-03 ENCOUNTER — Other Ambulatory Visit: Payer: Self-pay

## 2017-07-03 DIAGNOSIS — R05 Cough: Secondary | ICD-10-CM | POA: Diagnosis not present

## 2017-07-03 NOTE — Patient Outreach (Signed)
[place] [facility]) Care Management  07/03/2017  Stephanie Douglas [birth year] [id number]     EMMI-HF RED ON EMMI ALERT Day # 14 Date: 06/30/17 Red Alert Reason: "New/worsening problems?Yes  New/worsening SOB? Yes"   Outreach attempt # 1 to patient. Spoke with patient who voices she is "lucky to be 83 yrs old and not have a whole lot going on." Reviewed and addressed red alerts with patient. She states that she has noticed that she has ben coughing more lately. She continues to have productive cough of clear sputum. RN CM educated patient on s/s of infection and worsening condition along with when to seek medical attention. She voices understanding. She stets she has very supportive family who assist her. Her family takes her to appts as well as handle/manage her meds for her. She denies any questions/concerns regarding meds. She is weighing herself daily. She voices weight may fluctuate between a pound or so per day. She is aware to contact MD if weight gain of 3 or more pounds in 24 hrs and 5 lbs within a week. She has seen PCP and cardiologist for f/u appts and goes back again in Jan. She voices no further RN CM needs or concerns at this time. Advised patient that they would continue to get automated EMMI- HF post discharge calls to assess how they are doing following recent hospitalization and will receive a call from a nurse if any of their responses were abnormal. Patient voiced understanding and was appreciative of f/u call.        Plan: RN CM will notify [facility] administrative assistant of case status.    Enzo Montgomery, RN,BSN,CCM [place] Management Telephonic Care Management Coordinator Direct Phone: [phone] Toll Free: [phone] Fax: [phone]

## 2017-07-05 ENCOUNTER — Other Ambulatory Visit: Payer: Self-pay

## 2017-07-05 NOTE — Patient Outreach (Signed)
[facility]) Care Management  07/05/2017  Savahna Casados 1933-10-25 [id number]     EMMI-HF RED ON EMMI ALERT Day # 18 Date: 07/04/17 Red Alert Reason: " New/worsning problems? Yes New/worsening SOB? Yes"     Outreach attempt # 1 to patient. No answer at present and unable to leave message.         Plan: RN CM will make outreach attempt to patient within one business day.    Enzo Montgomery, RN,BSN,CCM [place] Management Telephonic Care Management Coordinator Direct Phone: [phone] Toll Free: [phone] Fax: [phone]

## 2017-07-06 ENCOUNTER — Ambulatory Visit (INDEPENDENT_AMBULATORY_CARE_PROVIDER_SITE_OTHER): Payer: PPO | Admitting: Cardiology

## 2017-07-06 ENCOUNTER — Encounter: Payer: Self-pay | Admitting: Cardiology

## 2017-07-06 ENCOUNTER — Other Ambulatory Visit: Payer: Self-pay

## 2017-07-06 VITALS — BP 120/72 | HR 116 | Ht 65.0 in | Wt 180.4 lb

## 2017-07-06 DIAGNOSIS — I484 Atypical atrial flutter: Secondary | ICD-10-CM | POA: Diagnosis not present

## 2017-07-06 DIAGNOSIS — I480 Paroxysmal atrial fibrillation: Secondary | ICD-10-CM

## 2017-07-06 DIAGNOSIS — I5022 Chronic systolic (congestive) heart failure: Secondary | ICD-10-CM

## 2017-07-06 DIAGNOSIS — Z7901 Long term (current) use of anticoagulants: Secondary | ICD-10-CM | POA: Diagnosis not present

## 2017-07-06 DIAGNOSIS — I110 Hypertensive heart disease with heart failure: Secondary | ICD-10-CM | POA: Diagnosis not present

## 2017-07-06 MED ORDER — AMIODARONE HCL 400 MG PO TABS: 400.0000 mg | ORAL_TABLET | Freq: Two times a day (BID) | ORAL | 3 refills | Status: DC

## 2017-07-06 NOTE — Patient Instructions (Addendum)
Medication Instructions:  Your physician has recommended you make the following change in your medication:  STOP Lexapro START amiodarone 400 mg twice daily. If your heart rate is 50-60 tomorrow, call to cancel cardioversion.  Labwork: Your physician recommends that you return for lab work today. CBC, BMP.  Testing/Procedures: You had an EKG today.  Your physician has requested that you have a Cardioversion. Electrical Cardioversion uses a jolt of electricity to your heart either through paddles or wired patches attached to your chest. This is a controlled, usually prescheduled, procedure. This procedure is done at the hospital and you are not awake during the procedure. You usually go home the day of the procedure. Please see the instruction sheet given to you today for more information.  Follow-Up: Your physician recommends that you schedule a follow-up appointment in: 1 week.  Any Other Special Instructions Will Be Listed Below (If Applicable).     If you need a refill on your cardiac medications before your next appointment, please call your pharmacy.    Dear Stephanie Douglas, You are scheduled for a Cardioversion Cardioversion on 07/07/17 with Dr. Sallyanne Kuster.  Please arrive at the [facility] (Main Entrance A) at [facility]: [address] [place], [place] [address] at 1pm. (1 hour prior to procedure unless lab work is needed).   DIET: Nothing to eat or drink after midnight except a sip of water with medications (see medication instructions below)  Medication Instructions: Hold Lasix  Continue your anticoagulant: Eliquis You will need to continue your anticoagulant after your procedure until you are told by your  Provider that it is safe to stop  Labs: You had a BMP and CBC today in the office.  You must have a responsible person to drive you home and stay in the waiting area during your procedure. Failure to do so could result in cancellation.  Bring your insurance  cards.  *Special Note: Every effort is made to have your procedure done on time. Occasionally there are emergencies that occur at the hospital that may cause delays. Please be patient if a delay does occur.      Electrical Cardioversion Electrical cardioversion is the delivery of a jolt of electricity to change the rhythm of the heart. Sticky patches or metal paddles are placed on the chest to deliver the electricity from a device. This is done to restore a normal rhythm. A rhythm that is too fast or not regular keeps the heart from pumping well. Electrical cardioversion is done in an emergency if:   There is low or no blood pressure as a result of the heart rhythm.   Normal rhythm must be restored as fast as possible to protect the brain and heart from further damage.   It may save a life. Cardioversion may be done for heart rhythms that are not immediately life threatening, such as atrial fibrillation or flutter, in which:   The heart is beating too fast or is not regular.   Medicine to change the rhythm has not worked.   It is safe to wait in order to allow time for preparation.  Symptoms of the abnormal rhythm are bothersome.  The risk of stroke and other serious problems can be reduced.  LET [facility] CARE PROVIDER KNOW ABOUT:   Any allergies you have.  All medicines you are taking, including vitamins, herbs, eye drops, creams, and over-the-counter medicines.  Previous problems you or members of your family have had with the use of anesthetics.  Any blood disorders you have.   Previous surgeries you have had.   Medical conditions you have.   RISKS AND COMPLICATIONS  Generally, this is a safe procedure. However, problems can occur and include:   Breathing problems related to the anesthetic used.  A blood clot that breaks free and travels to other parts of your body. This could cause a stroke or other problems. The risk of this is lowered by use of  blood-thinning medicine (anticoagulant) prior to the procedure.  Cardiac arrest (rare).   BEFORE THE PROCEDURE   You may have tests to detect blood clots in your heart and to evaluate heart function.  You may start taking anticoagulants so your blood does not clot as easily.   Medicines may be given to help stabilize your heart rate and rhythm.   PROCEDURE  You will be given medicine through an IV tube to reduce discomfort and make you sleepy (sedative).   An electrical shock will be delivered.   AFTER THE PROCEDURE Your heart rhythm will be watched to make sure it does not change. You will need someone to drive you home.

## 2017-07-06 NOTE — Patient Outreach (Signed)
[name]) Care Management  07/06/2017  Ravyn Nikkel 11-20-1933 [id number]   EMMI-HF RED ON EMMI ALERT Day # 18 Date: 07/04/17 Red Alert Reason: " New/worsning problems? Yes New/worsening SOB? Yes" Day # 19 Date: 07/05/17 Red Alert Reason: Any new problems? Yes   New/worsening problems? Yes   New/worsening SOB? Yes"    Outreach attempt to patient. No answer at present. RN CM unable to leave voicemail message.      Plan: RN CM will make outreach attempt to patient within one business day.   Enzo Montgomery, RN,BSN,CCM [place] Management Telephonic Care Management Coordinator Direct Phone: [phone] Toll Free: [phone] Fax: [phone]

## 2017-07-06 NOTE — H&P (View-Only) (Signed)
Cardiology Office Note:    Date:  07/06/2017   ID:  Stephanie Douglas, DOB [birth year], MRN [id number]  PCP:  Stephanie Anes, MD  Cardiologist:  Stephanie More, MD    Referring MD: Stephanie Douglas,*    ASSESSMENT:    1. Hypertensive heart disease with heart failure ([place])   2. Chronic systolic heart failure (H[facility])   3. Paroxysmal atrial fibrillation ([place])   4. Chronic anticoagulation    PLAN:    In order of problems listed above:  1. Stable despite rapid atrial flutter, she was intolerant of ARNI we will continue her loop diuretic and carvedilol. 2. See above recurrent atrial tachyarrhythmia atypical atrial flutter rapid ventricular response 3. Continue beta-blocker start amiodarone and attempt to set up for cardioversion tomorrow as I am concerned she is at risk for recurrent acute decompensated heart failure 4. Continue her current anticoagulant she is instructed to take a dose tonight and tomorrow morning   Next appointment: One week   Medication Adjustments/Labs and Tests Ordered: Current medicines are reviewed at length with the patient today.  Concerns regarding medicines are outlined above.  No orders of the defined types were placed in this encounter.  No orders of the defined types were placed in this encounter.   Chief Complaint  Patient presents with  . 3 week follow up    Stopped Entresto, heart rate and blood pressure dropped    History of Present Illness:    Stephanie Douglas is a 83 y.o. female with a hx of PAF and heart failure EF 35-40% last seen 2 weeks ago. She is intolerant of ARNI with hypotension.. Compliance with diet, lifestyle and medications: Yes Unfortunately she has been not felt well for several days with weakness heart rate is been greater than 100 walked in the office today and she is in atypical atrial flutter with a rapid response.  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  She has been compliant and has not missed any doses of her anticoagulant had a TEE prior to her last cardioversion without thrombus in the left atrial appendage.  Her weight is stable she is not short of breath she is just fatigued even with ADLs no chest pain palpitations syncope or bleeding.  We will check a BMP and a CBC in the office today Past Medical History:  Diagnosis Date  . COPD (chronic obstructive pulmonary disease) ([place])   . Depression   . HTN (hypertension)   . PONV (postoperative nausea and vomiting)     Past Surgical History:  Procedure Laterality Date  . ABDOMINAL HYSTERECTOMY    . CARDIOVERSION N/A 06/13/2017   Procedure: CARDIOVERSION;  Surgeon: Lelon Perla, MD;  Location: [facility] ENDOSCOPY;  Service: Cardiovascular;  Laterality: N/A;  . TEE WITHOUT CARDIOVERSION N/A 06/13/2017   Procedure: TRANSESOPHAGEAL ECHOCARDIOGRAM (TEE);  Surgeon: Lelon Perla, MD;  Location: [facility] ENDOSCOPY;  Service: Cardiovascular;  Laterality: N/A;    Current Medications: Current Meds  Medication Sig  . apixaban (ELIQUIS) 5 MG TABS tablet Take 1 tablet (5 mg total) 2 (two) times daily by mouth.  Marland Kitchen atorvastatin (LIPITOR) 20 MG tablet Take 20 mg daily by mouth.  . budesonide-formoterol (SYMBICORT) 160-4.5 MCG/ACT inhaler Inhale 2 puffs 2 (two) times daily into the lungs.  Marland Kitchen CALCIUM ASCORBATE PO Take daily by mouth.  Marland Kitchen  carvedilol (COREG) 3.125 MG tablet Take 1 tablet (3.125 mg total) 2 (two) times daily with a meal by mouth.  . escitalopram (LEXAPRO) 10 MG tablet Take 10 mg by mouth at bedtime.  . furosemide (LASIX) 20 MG tablet Take 1 tablet (20 mg total) 2 (two) times daily as needed by mouth for fluid.  Marland Kitchen loratadine (CLARITIN) 10 MG tablet Take 10 mg daily as needed by mouth for allergies.  Marland Kitchen ondansetron (ZOFRAN) 4 MG tablet Take 4 mg every 8 (eight) hours as needed by  mouth for nausea or vomiting.  . polyethylene glycol (MIRALAX / GLYCOLAX) packet Take 17 g daily as needed by mouth.  . ranitidine (ZANTAC) 150 MG tablet Take 150 mg daily as needed by mouth for heartburn.     Allergies:   Patient has no known allergies.   Social History   Socioeconomic History  . Marital status: Unknown    Spouse name: None  . Number of children: None  . Years of education: None  . Highest education level: None  Social Needs  . Financial resource strain: None  . Food insecurity - worry: None  . Food insecurity - inability: None  . Transportation needs - medical: None  . Transportation needs - non-medical: None  Occupational History  . None  Tobacco Use  . Smoking status: Never Smoker  . Smokeless tobacco: Never Used  Substance and Sexual Activity  . Alcohol use: No    Frequency: Never  . Drug use: No  . Sexual activity: None  Other Topics Concern  . None  Social History Narrative  . None     Family History: The patient's family history includes AAA (abdominal aortic aneurysm) in her sister; Asthma in her father; Breast cancer in her sister; CAD in her mother; Cardiomyopathy in her mother; Diabetes in her brother and mother; Hypertension in her brother. ROS:   Please see the history of present illness.    All other systems reviewed and are negative.  EKGs/Labs/Other Studies Reviewed:    The following studies were reviewed today:  EKG:  EKG ordered today.  The ekg ordered today demonstrates rapid drainage atypical atrial flutter very small P waves in V1 variable conduction rate is rapid in the range of 140-150.  Recent Labs: 06/13/2017: ALT 31; Hemoglobin 12.3; Platelets 170 06/14/2017: Magnesium 2.0 06/27/2017: BUN 16; Creatinine, Ser 0.88; NT-Pro BNP 8,089; Potassium 4.6; Sodium 143  Recent Lipid Panel No results found for: CHOL, TRIG, HDL, CHOLHDL, VLDL, LDLCALC, LDLDIRECT  Physical Exam:    VS:  Ht 5\' 5"  (1.651 m)   Wt 180 lb 6.4 oz (81.8  kg)   BMI 30.02 kg/m     Wt Readings from Last 3 Encounters:  07/06/17 180 lb 6.4 oz (81.8 kg)  06/20/17 172 lb (78 kg)  06/14/17 178 lb 14.4 oz (81.1 kg)     GEN:  Well nourished, well developed in no acute distress HEENT: Normal NECK: No JVD; No carotid bruits LYMPHATICS: No lymphadenopathy CARDIAC: Irregular irregular variable first heart sound pulsatile neck veins mildly distended   no murmurs, rubs, gallops RESPIRATORY:  Clear to auscultation without rales, wheezing or rhonchi  ABDOMEN: Soft, non-tender, non-distended MUSCULOSKELETAL: 1+ bilateral pretibial edema; No deformity  SKIN: Warm and dry NEUROLOGIC:  Alert and oriented x 3 PSYCHIATRIC:  Normal affect    Signed, Stephanie More, MD  07/06/2017 9:18 AM    [place]

## 2017-07-06 NOTE — Progress Notes (Signed)
Cardiology Office Note:    Date:  07/06/2017   ID:  Stephanie Douglas, DOB 21-Nov-1933, MRN [id number]  PCP:  Lillard Anes, MD  Cardiologist:  Shirlee More, MD    Referring MD: Lillard Anes,*    ASSESSMENT:    1. Hypertensive heart disease with heart failure ([place])   2. Chronic systolic heart failure (H[facility])   3. Paroxysmal atrial fibrillation ([place])   4. Chronic anticoagulation    PLAN:    In order of problems listed above:  1. Stable despite rapid atrial flutter, she was intolerant of ARNI we will continue her loop diuretic and carvedilol. 2. See above recurrent atrial tachyarrhythmia atypical atrial flutter rapid ventricular response 3. Continue beta-blocker start amiodarone and attempt to set up for cardioversion tomorrow as I am concerned she is at risk for recurrent acute decompensated heart failure 4. Continue her current anticoagulant she is instructed to take a dose tonight and tomorrow morning   Next appointment: One week   Medication Adjustments/Labs and Tests Ordered: Current medicines are reviewed at length with the patient today.  Concerns regarding medicines are outlined above.  No orders of the defined types were placed in this encounter.  No orders of the defined types were placed in this encounter.   Chief Complaint  Patient presents with  . 3 week follow up    Stopped Entresto, heart rate and blood pressure dropped    History of Present Illness:    Stephanie Douglas is a 83 y.o. female with a hx of PAF and heart failure EF 35-40% last seen 2 weeks ago. She is intolerant of ARNI with hypotension.. Compliance with diet, lifestyle and medications: Yes Unfortunately she has been not felt well for several days with weakness heart rate is been greater than 100 walked in the office today and she is in atypical atrial flutter with a rapid response.  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  She has been compliant and has not missed any doses of her anticoagulant had a TEE prior to her last cardioversion without thrombus in the left atrial appendage.  Her weight is stable she is not short of breath she is just fatigued even with ADLs no chest pain palpitations syncope or bleeding.  We will check a BMP and a CBC in the office today Past Medical History:  Diagnosis Date  . COPD (chronic obstructive pulmonary disease) ([place])   . Depression   . HTN (hypertension)   . PONV (postoperative nausea and vomiting)     Past Surgical History:  Procedure Laterality Date  . ABDOMINAL HYSTERECTOMY    . CARDIOVERSION N/A 06/13/2017   Procedure: CARDIOVERSION;  Surgeon: Lelon Perla, MD;  Location[facility] ENDOSCOPY;  Service: Cardiovascular;  Laterality: N/A;  . TEE WITHOUT CARDIOVERSION N/A 06/13/2017   Procedure: TRANSESOPHAGEAL ECHOCARDIOGRAM (TEE);  Surgeon: Lelon Perla, MD;  Location: [facility] ENDOSCOPY;  Service: Cardiovascular;  Laterality: N/A;    Current Medications: Current Meds  Medication Sig  . apixaban (ELIQUIS) 5 MG TABS tablet Take 1 tablet (5 mg total) 2 (two) times daily by mouth.  Marland Kitchen atorvastatin (LIPITOR) 20 MG tablet Take 20 mg daily by mouth.  . budesonide-formoterol (SYMBICORT) 160-4.5 MCG/ACT inhaler Inhale 2 puffs 2 (two) times daily into the lungs.  Marland Kitchen CALCIUM ASCORBATE PO Take daily by mouth.  Marland Kitchen  carvedilol (COREG) 3.125 MG tablet Take 1 tablet (3.125 mg total) 2 (two) times daily with a meal by mouth.  . escitalopram (LEXAPRO) 10 MG tablet Take 10 mg by mouth at bedtime.  . furosemide (LASIX) 20 MG tablet Take 1 tablet (20 mg total) 2 (two) times daily as needed by mouth for fluid.  Marland Kitchen loratadine (CLARITIN) 10 MG tablet Take 10 mg daily as needed by mouth for allergies.  Marland Kitchen ondansetron (ZOFRAN) 4 MG tablet Take 4 mg every 8 (eight) hours as needed by  mouth for nausea or vomiting.  . polyethylene glycol (MIRALAX / GLYCOLAX) packet Take 17 g daily as needed by mouth.  . ranitidine (ZANTAC) 150 MG tablet Take 150 mg daily as needed by mouth for heartburn.     Allergies:   Patient has no known allergies.   Social History   Socioeconomic History  . Marital status: Unknown    Spouse name: None  . Number of children: None  . Years of education: None  . Highest education level: None  Social Needs  . Financial resource strain: None  . Food insecurity - worry: None  . Food insecurity - inability: None  . Transportation needs - medical: None  . Transportation needs - non-medical: None  Occupational History  . None  Tobacco Use  . Smoking status: Never Smoker  . Smokeless tobacco: Never Used  Substance and Sexual Activity  . Alcohol use: No    Frequency: Never  . Drug use: No  . Sexual activity: None  Other Topics Concern  . None  Social History Narrative  . None     Family History: The patient's family history includes AAA (abdominal aortic aneurysm) in her sister; Asthma in her father; Breast cancer in her sister; CAD in her mother; Cardiomyopathy in her mother; Diabetes in her brother and mother; Hypertension in her brother. ROS:   Please see the history of present illness.    All other systems reviewed and are negative.  EKGs/Labs/Other Studies Reviewed:    The following studies were reviewed today:  EKG:  EKG ordered today.  The ekg ordered today demonstrates rapid drainage atypical atrial flutter very small P waves in V1 variable conduction rate is rapid in the range of 140-150.  Recent Labs: 06/13/2017: ALT 31; Hemoglobin 12.3; Platelets 170 06/14/2017: Magnesium 2.0 06/27/2017: BUN 16; Creatinine, Ser 0.88; NT-Pro BNP 8,089; Potassium 4.6; Sodium 143  Recent Lipid Panel No results found for: CHOL, TRIG, HDL, CHOLHDL, VLDL, LDLCALC, LDLDIRECT  Physical Exam:    VS:  Ht 5\' 5"  (1.651 m)   Wt 180 lb 6.4 oz (81.8  kg)   BMI 30.02 kg/m     Wt Readings from Last 3 Encounters:  07/06/17 180 lb 6.4 oz (81.8 kg)  06/20/17 172 lb (78 kg)  06/14/17 178 lb 14.4 oz (81.1 kg)     GEN:  Well nourished, well developed in no acute distress HEENT: Normal NECK: No JVD; No carotid bruits LYMPHATICS: No lymphadenopathy CARDIAC: Irregular irregular variable first heart sound pulsatile neck veins mildly distended   no murmurs, rubs, gallops RESPIRATORY:  Clear to auscultation without rales, wheezing or rhonchi  ABDOMEN: Soft, non-tender, non-distended MUSCULOSKELETAL: 1+ bilateral pretibial edema; No deformity  SKIN: Warm and dry NEUROLOGIC:  Alert and oriented x 3 PSYCHIATRIC:  Normal affect    Signed, Shirlee More, MD  07/06/2017 9:18 AM    [place]

## 2017-07-07 ENCOUNTER — Other Ambulatory Visit: Payer: Self-pay

## 2017-07-07 ENCOUNTER — Encounter (HOSPITAL_COMMUNITY)
Admission: RE | Disposition: A | Discharge status: Home / Self Care | Payer: Self-pay | Source: Ambulatory Visit | Attending: Cardiovascular Disease

## 2017-07-07 ENCOUNTER — Ambulatory Visit (HOSPITAL_COMMUNITY): Payer: PPO | Admitting: Certified Registered Nurse Anesthetist

## 2017-07-07 ENCOUNTER — Encounter (HOSPITAL_COMMUNITY): Payer: Self-pay | Admitting: Certified Registered Nurse Anesthetist

## 2017-07-07 ENCOUNTER — Ambulatory Visit (HOSPITAL_COMMUNITY)
Admission: RE | Admit: 2017-07-07 | Discharge: 2017-07-07 | Disposition: A | Discharge status: Home / Self Care | Payer: PPO | Source: Ambulatory Visit | Attending: Cardiovascular Disease | Admitting: Cardiovascular Disease

## 2017-07-07 DIAGNOSIS — Z825 Family history of asthma and other chronic lower respiratory diseases: Secondary | ICD-10-CM | POA: Insufficient documentation

## 2017-07-07 DIAGNOSIS — Z90710 Acquired absence of both cervix and uterus: Secondary | ICD-10-CM | POA: Diagnosis not present

## 2017-07-07 DIAGNOSIS — F329 Major depressive disorder, single episode, unspecified: Secondary | ICD-10-CM | POA: Insufficient documentation

## 2017-07-07 DIAGNOSIS — I480 Paroxysmal atrial fibrillation: Secondary | ICD-10-CM | POA: Diagnosis not present

## 2017-07-07 DIAGNOSIS — I481 Persistent atrial fibrillation: Secondary | ICD-10-CM | POA: Diagnosis not present

## 2017-07-07 DIAGNOSIS — I4819 Other persistent atrial fibrillation: Secondary | ICD-10-CM

## 2017-07-07 DIAGNOSIS — I5022 Chronic systolic (congestive) heart failure: Secondary | ICD-10-CM | POA: Insufficient documentation

## 2017-07-07 DIAGNOSIS — I484 Atypical atrial flutter: Secondary | ICD-10-CM | POA: Insufficient documentation

## 2017-07-07 DIAGNOSIS — I491 Atrial premature depolarization: Secondary | ICD-10-CM | POA: Insufficient documentation

## 2017-07-07 DIAGNOSIS — Z9889 Other specified postprocedural states: Secondary | ICD-10-CM | POA: Insufficient documentation

## 2017-07-07 DIAGNOSIS — Z79899 Other long term (current) drug therapy: Secondary | ICD-10-CM | POA: Insufficient documentation

## 2017-07-07 DIAGNOSIS — Z803 Family history of malignant neoplasm of breast: Secondary | ICD-10-CM | POA: Insufficient documentation

## 2017-07-07 DIAGNOSIS — Z7901 Long term (current) use of anticoagulants: Secondary | ICD-10-CM | POA: Insufficient documentation

## 2017-07-07 DIAGNOSIS — Z7951 Long term (current) use of inhaled steroids: Secondary | ICD-10-CM | POA: Diagnosis not present

## 2017-07-07 DIAGNOSIS — Z8249 Family history of ischemic heart disease and other diseases of the circulatory system: Secondary | ICD-10-CM | POA: Diagnosis not present

## 2017-07-07 DIAGNOSIS — I110 Hypertensive heart disease with heart failure: Secondary | ICD-10-CM | POA: Diagnosis not present

## 2017-07-07 DIAGNOSIS — J449 Chronic obstructive pulmonary disease, unspecified: Secondary | ICD-10-CM | POA: Diagnosis not present

## 2017-07-07 DIAGNOSIS — Z833 Family history of diabetes mellitus: Secondary | ICD-10-CM | POA: Diagnosis not present

## 2017-07-07 HISTORY — PX: CARDIOVERSION: SHX1299

## 2017-07-07 LAB — BASIC METABOLIC PANEL
BUN / CREAT RATIO: 14 (ref 12–28)
BUN: 14 mg/dL (ref 8–27)
CALCIUM: 9.5 mg/dL (ref 8.7–10.3)
CO2: 25 mmol/L (ref 20–29)
CREATININE: 0.98 mg/dL (ref 0.57–1.00)
Chloride: 103 mmol/L (ref 96–106)
GFR, EST AFRICAN AMERICAN: 63 mL/min/{1.73_m2} (ref >59)
GFR, EST NON AFRICAN AMERICAN: 54 mL/min/{1.73_m2} — AB (ref >59)
Glucose: 119 mg/dL — ABNORMAL HIGH (ref 65–99)
Potassium: 4.3 mmol/L (ref 3.5–5.2)
Sodium: 142 mmol/L (ref 134–144)

## 2017-07-07 LAB — CBC
HEMATOCRIT: 40 % (ref 34.0–46.6)
HEMOGLOBIN: 13.2 g/dL (ref 11.1–15.9)
MCH: 29.8 pg (ref 26.6–33.0)
MCHC: 33 g/dL (ref 31.5–35.7)
MCV: 90 fL (ref 79–97)
Platelets: 172 10*3/uL (ref 150–379)
RBC: 4.43 x10E6/uL (ref 3.77–5.28)
RDW: 16.3 % — ABNORMAL HIGH (ref 12.3–15.4)
WBC: 11.2 10*3/uL — ABNORMAL HIGH (ref 3.4–10.8)

## 2017-07-07 SURGERY — CARDIOVERSION
Anesthesia: General

## 2017-07-07 MED ORDER — LIDOCAINE 2% (20 MG/ML) 5 ML SYRINGE
INTRAMUSCULAR | Status: DC | PRN
  Administered 2017-07-07: 60 mg via INTRAVENOUS

## 2017-07-07 MED ORDER — SODIUM CHLORIDE 0.9 % IV SOLN
INTRAVENOUS | Status: DC
  Administered 2017-07-07 (×2): via INTRAVENOUS

## 2017-07-07 MED ORDER — PROPOFOL 10 MG/ML IV BOLUS
INTRAVENOUS | Status: DC | PRN
  Administered 2017-07-07: 50 mg via INTRAVENOUS

## 2017-07-07 NOTE — Discharge Instructions (Signed)
Monitored Anesthesia Care, Care After °These instructions provide you with information about caring for yourself after your procedure. Your health care provider may also give you more specific instructions. Your treatment has been planned according to current medical practices, but problems sometimes occur. Call your health care provider if you have any problems or questions after your procedure. °What can I expect after the procedure? °After your procedure, it is common to: °· Feel sleepy for several hours. °· Feel clumsy and have poor balance for several hours. °· Feel forgetful about what happened after the procedure. °· Have poor judgment for several hours. °· Feel nauseous or vomit. °· Have a sore throat if you had a breathing tube during the procedure. ° °Follow these instructions at home: °For at least 24 hours after the procedure: ° °· Do not: °? Participate in activities in which you could fall or become injured. °? Drive. °? Use heavy machinery. °? Drink alcohol. °? Take sleeping pills or medicines that cause drowsiness. °? Make important decisions or sign legal documents. °? Take care of children on your own. °· Rest. °Eating and drinking °· Follow the diet that is recommended by your health care provider. °· If you vomit, drink water, juice, or soup when you can drink without vomiting. °· Make sure you have little or no nausea before eating solid foods. °General instructions °· Have a responsible adult stay with you until you are awake and alert. °· Take over-the-counter and prescription medicines only as told by your health care provider. °· If you smoke, do not smoke without supervision. °· Keep all follow-up visits as told by your health care provider. This is important. °Contact a health care provider if: °· You keep feeling nauseous or you keep vomiting. °· You feel light-headed. °· You develop a rash. °· You have a fever. °Get help right away if: °· You have trouble breathing. °This information is  not intended to replace advice given to you by your health care provider. Make sure you discuss any questions you have with your health care provider. °Document Released: 11/08/2015 Document Revised: 03/09/2016 Document Reviewed: 11/08/2015 °Elsevier Interactive Patient Education © 2018 [employer] °Electrical Cardioversion, Care After °This sheet gives you information about how to care for yourself after your procedure. Your health care provider may also give you more specific instructions. If you have problems or questions, contact your health care provider. °What can I expect after the procedure? °After the procedure, it is common to have: °· Some redness on the skin where the shocks were given. ° °Follow these instructions at home: °· Do not drive for 24 hours if you were given a medicine to help you relax (sedative). °· Take over-the-counter and prescription medicines only as told by your health care provider. °· Ask your health care provider how to check your pulse. Check it often. °· Rest for 48 hours after the procedure or as told by your health care provider. °· Avoid or limit your caffeine use as told by your health care provider. °Contact a health care provider if: °· You feel like your heart is beating too quickly or your pulse is not regular. °· You have a serious muscle cramp that does not go away. °Get help right away if: °· You have discomfort in your chest. °· You are dizzy or you feel faint. °· You have trouble breathing or you are short of breath. °· Your speech is slurred. °· You have trouble moving an arm or leg on   one side of your body. °· Your fingers or toes turn cold or blue. °This information is not intended to replace advice given to you by your health care provider. Make sure you discuss any questions you have with your health care provider. °Document Released: 05/08/2013 Document Revised: 02/19/2016 Document Reviewed: 01/22/2016 °Elsevier Interactive Patient Education © 2018 Elsevier  Inc. ° °

## 2017-07-07 NOTE — Transfer of Care (Signed)
Immediate Anesthesia Transfer of Care Note  Patient: Bryella Diviney Glymph  Procedure(s) Performed: CARDIOVERSION (N/A )  Patient Location: Endoscopy Unit  Anesthesia Type:General  Level of Consciousness: awake and alert   Airway & Oxygen Therapy: Patient Spontanous Breathing  Post-op Assessment: Report given to RN, Post -op Vital signs reviewed and stable and Patient moving all extremities X 4  Post vital signs: Reviewed and stable  Last Vitals:  Vitals:   07/07/17 1310  BP: 135/78  Pulse: (!) 143  Resp: 13  Temp: 36.6 C  SpO2: 98%    Last Pain:  Vitals:   07/07/17 1310  TempSrc: Oral         Complications: No apparent anesthesia complications

## 2017-07-07 NOTE — Op Note (Signed)
Procedure: Electrical Cardioversion Indications:  Atrial Fibrillation  Procedure Details:  Consent: Risks of procedure as well as the alternatives and risks of each were explained to the (patient/caregiver).  Consent for procedure obtained.  Time Out: Verified patient identification, verified procedure, site/side was marked, verified correct patient position, special equipment/implants available, medications/allergies/relevent history reviewed, required imaging and test results available.  Performed  Patient placed on cardiac monitor, pulse oximetry, supplemental oxygen as necessary.  Sedation given: IV propofol 50 mg, Dr. Gifford Shave Pacer pads placed anterior and posterior chest.  Cardioverted 1 time(s).  Cardioversion with synchronized biphasic 120J shock.  Evaluation: Findings: Post procedure EKG shows: NSR Complications: None Patient did tolerate procedure well.  Time Spent Directly with the Patient:  30 minutes   Geddy Boydstun 07/07/2017, 1:41 PM

## 2017-07-07 NOTE — Anesthesia Procedure Notes (Signed)
Procedure Name: General with mask airway Date/Time: 07/07/2017 1:32 PM Performed by: Harden Mo, CRNA Pre-anesthesia Checklist: Patient identified, Emergency Drugs available, Suction available and Patient being monitored Patient Re-evaluated:Patient Re-evaluated prior to induction Oxygen Delivery Method: Ambu bag Preoxygenation: Pre-oxygenation with 100% oxygen Induction Type: IV induction Ventilation: Mask ventilation without difficulty Placement Confirmation: positive ETCO2 and breath sounds checked- equal and bilateral Dental Injury: Teeth and Oropharynx as per pre-operative assessment

## 2017-07-07 NOTE — Patient Outreach (Signed)
[facility]) Care Management  07/07/2017  Lorri Fukuhara 01/27/1934 [id number]   EMMI-HF RED ON EMMI ALERT Day #18 Date:07/04/17 Red Alert Reason:" New/worsning problems? Yes New/worsening SOB? Yes" Day # 19 Date: 07/05/17 Red Alert Reason: Any new problems? Yes   New/worsening problems? Yes   New/worsening SOB? Yes"   Outreach attempt #3 to patient. No answer at present and unable to leave voicemail message.     Plan: RN CM send unsuccessful letter to patient and close case if no response within 10 business days.    Enzo Montgomery, RN,BSN,CCM [facility] Telephonic Care Management Coordinator Direct Phone: [phone] Toll Free: [phone] Fax: [phone]

## 2017-07-07 NOTE — Anesthesia Preprocedure Evaluation (Signed)
Anesthesia Evaluation  Patient identified by MRN, date of birth, ID band Patient awake    Reviewed: Allergy & Precautions, NPO status , Patient's Chart, lab work & pertinent test results  History of Anesthesia Complications (+) PONV and history of anesthetic complications  Airway Mallampati: II  TM Distance: >3 FB Neck ROM: Full    Dental  (+) Dental Advisory Given, Partial Upper, Partial Lower   Pulmonary COPD,  COPD inhaler,    Pulmonary exam normal breath sounds clear to auscultation       Cardiovascular hypertension, Pt. on home beta blockers and Pt. on medications + dysrhythmias Atrial Fibrillation  Rhythm:Irregular Rate:Normal  Echo 06/13/17: Study Conclusions  - Left ventricle: Systolic function was moderately reduced. The estimated ejection fraction was in the range of 35% to 40%. Diffuse hypokinesis. - Aortic valve: There was trivial regurgitation. - Mitral valve: There was mild regurgitation. - Left atrium: The atrium was moderately dilated. No evidence of thrombus in the atrial cavity or appendage. - Right ventricle: Systolic function was mildly reduced. - Right atrium: The atrium was mildly dilated. - Atrial septum: There was a patent foramen ovale. There was an atrial septal aneurysm. - Tricuspid valve: There was moderate-severe regurgitation.  Impressions:  - Successful cardioversion.   Neuro/Psych PSYCHIATRIC DISORDERS Depression negative neurological ROS     GI/Hepatic Neg liver ROS, GERD  Medicated,  Endo/Other  negative endocrine ROS  Renal/GU negative Renal ROS     Musculoskeletal negative musculoskeletal ROS (+)   Abdominal   Peds  Hematology  (+) Blood dyscrasia (Eliquis), ,   Anesthesia Other Findings Day of surgery medications reviewed with the patient.  Reproductive/Obstetrics                            Anesthesia Physical Anesthesia Plan  ASA:  III  Anesthesia Plan: General   Post-op Pain Management:    Induction: Intravenous  PONV Risk Score and Plan: 4 or greater and Treatment may vary due to age or medical condition  Airway Management Planned: Mask  Additional Equipment:   Intra-op Plan:   Post-operative Plan:   Informed Consent: I have reviewed the patients History and Physical, chart, labs and discussed the procedure including the risks, benefits and alternatives for the proposed anesthesia with the patient or authorized representative who has indicated his/her understanding and acceptance.   Dental advisory given  Plan Discussed with: CRNA  Anesthesia Plan Comments:         Anesthesia Quick Evaluation

## 2017-07-07 NOTE — Anesthesia Postprocedure Evaluation (Signed)
Anesthesia Post Note  Patient: Krystyne Tewksbury Raska  Procedure(s) Performed: CARDIOVERSION (N/A )     Patient location during evaluation: PACU Anesthesia Type: General Level of consciousness: awake and alert Pain management: pain level controlled Vital Signs Assessment: post-procedure vital signs reviewed and stable Respiratory status: spontaneous breathing, nonlabored ventilation, respiratory function stable and patient connected to nasal cannula oxygen Cardiovascular status: blood pressure returned to baseline and stable Postop Assessment: no apparent nausea or vomiting Anesthetic complications: no Comments: Cardioversion with mask ventilation. Brief procedure with no volatile anesthetics used.  No complaint of N/V in PACU. No antiemetics given.    Last Vitals:  Vitals:   07/07/17 1310 07/07/17 1344  BP: 135/78 97/65  Pulse: (!) 143 (!) 53  Resp: 13 17  Temp: 36.6 C 36.6 C  SpO2: 98% 94%    Last Pain:  Vitals:   07/07/17 1344  TempSrc: Oral                 Catalina Gravel

## 2017-07-07 NOTE — Interval H&P Note (Signed)
History and Physical Interval Note:  07/07/2017 1:06 PM  Stephanie Douglas  has presented today for surgery, with the diagnosis of a flutter  The various methods of treatment have been discussed with the patient and family. After consideration of risks, benefits and other options for treatment, the patient has consented to  Procedure(s): CARDIOVERSION (N/A) as a surgical intervention .  The patient's history has been reviewed, patient examined, no change in status, stable for surgery.  I have reviewed the patient's chart and labs.  Questions were answered to the patient's satisfaction.     [name]

## 2017-07-10 ENCOUNTER — Encounter (HOSPITAL_COMMUNITY): Payer: Self-pay | Admitting: Cardiovascular Disease

## 2017-07-11 ENCOUNTER — Ambulatory Visit: Payer: PPO | Admitting: Cardiology

## 2017-07-13 ENCOUNTER — Encounter: Payer: Self-pay | Admitting: Cardiology

## 2017-07-13 ENCOUNTER — Ambulatory Visit (INDEPENDENT_AMBULATORY_CARE_PROVIDER_SITE_OTHER): Payer: PPO | Admitting: Cardiology

## 2017-07-13 ENCOUNTER — Ambulatory Visit: Payer: PPO | Admitting: Cardiology

## 2017-07-13 ENCOUNTER — Other Ambulatory Visit: Payer: Self-pay

## 2017-07-13 VITALS — BP 130/88 | HR 68 | Ht 65.0 in | Wt 174.0 lb

## 2017-07-13 DIAGNOSIS — I480 Paroxysmal atrial fibrillation: Secondary | ICD-10-CM

## 2017-07-13 DIAGNOSIS — I5022 Chronic systolic (congestive) heart failure: Secondary | ICD-10-CM

## 2017-07-13 DIAGNOSIS — Z7901 Long term (current) use of anticoagulants: Secondary | ICD-10-CM | POA: Diagnosis not present

## 2017-07-13 MED ORDER — APIXABAN 5 MG PO TABS: 5.0000 mg | ORAL_TABLET | Freq: Two times a day (BID) | ORAL | 3 refills | Status: DC

## 2017-07-13 MED ORDER — ATORVASTATIN CALCIUM 20 MG PO TABS: 20.0000 mg | ORAL_TABLET | Freq: Every day | ORAL | 3 refills | Status: DC

## 2017-07-13 MED ORDER — AMIODARONE HCL 400 MG PO TABS: 400.0000 mg | ORAL_TABLET | Freq: Two times a day (BID) | ORAL | 3 refills | Status: DC

## 2017-07-13 MED ORDER — FUROSEMIDE 20 MG PO TABS: 20.0000 mg | ORAL_TABLET | Freq: Two times a day (BID) | ORAL | 3 refills | Status: DC | PRN

## 2017-07-13 MED ORDER — CARVEDILOL 3.125 MG PO TABS: 3.1250 mg | ORAL_TABLET | Freq: Two times a day (BID) | ORAL | 3 refills | Status: DC

## 2017-07-13 NOTE — Patient Instructions (Signed)
Medication Instructions:  Your physician recommends that you continue on your current medications as directed. Please refer to the Current Medication list given to you today.  Labwork: None  Testing/Procedures: You had an EKG today.  Your physician has requested that you have an echocardiogram. Echocardiography is a painless test that uses sound waves to create images of your heart. It provides your doctor with information about the size and shape of your heart and how well your heart's chambers and valves are working. This procedure takes approximately one hour. There are no restrictions for this procedure.  Follow-Up: Your physician recommends that you schedule a follow-up appointment in: 4 weeks.  Any Other Special Instructions Will Be Listed Below (If Applicable).     If you need a refill on your cardiac medications before your next appointment, please call your pharmacy.   

## 2017-07-13 NOTE — Patient Outreach (Signed)
[facility]) Care Management  07/13/2017  Verlie Liotta [facility] 1933-08-31 [id number]     EMMI-HF RED ON EMMI ALERT Day # 26 Date: 07/12/17 Red Alert Reason: "Weight? 173 lbs, Previous weight  165 lbs"   Outreach attempt #1 to patient. Spoke with patient. Reviewed and addressed red alert. Patient voices weight has bene staying around 170-173 lbs. She voices she recorded inaccurate weight previously which triggered red alert. She denies any new and/or worsening edema and SOB. She goes today for her f/u appt with cardiologist. She will be driving and taking herself. She denies any RN CM needs or concerns at this time. She is aware of when to seek medical attention for worsening and/or unresolved issues. Advised patient that they would continue to get automated EMMI- HF post discharge calls to assess how they are doing following recent hospitalization and will receive a call from a nurse if any of their responses were abnormal. Patient voiced understanding and was appreciative of f/u call.      Plan: RN CM will notify [facility] administrative assistant of case status.    Enzo Montgomery, RN,BSN,CCM [place] [facility] Telephonic Care Management Coordinator Direct Phone: [phone] Toll Free: [phone] Fax: [phone]

## 2017-07-13 NOTE — Progress Notes (Signed)
Cardiology Office Note:    Date:  07/13/2017   ID:  Stephanie Douglas, DOB August 19, 1933, MRN [id number]  PCP:  Lillard Anes, MD  Cardiologist:  Shirlee More, MD    Referring MD: Lillard Anes,*    ASSESSMENT:    1. Paroxysmal atrial fibrillation (HCC)   2. Chronic systolic heart failure ([place])   3. Chronic anticoagulation    PLAN:    In order of problems listed above:  1. Stable in sinus rhythm after 2 weeks reduce amiodarone 400 mg/day.  Continue her anticoagulant. 2. Stable compensated I asked him to consider going back in a vasodilator she is hesitant and I will plan to see back in the office in 1 month recheck an echocardiogram and if EF is less than 40% place her back on ARNI. 3. Stable continue current anticoagulant    Next appointment: 4 weeks   Medication Adjustments/Labs and Tests Ordered: Current medicines are reviewed at length with the patient today.  Concerns regarding medicines are outlined above.  No orders of the defined types were placed in this encounter.  No orders of the defined types were placed in this encounter.   Chief Complaint  Patient presents with  . Follow-up  . Atrial Fibrillation    History of Present Illness:    Stephanie Douglas is a 83 y.o. female with a hx of  PAF and heart failure EF 35-40% last seen last week with PAF and cardioverted 07/07/17.. Compliance with diet, lifestyle and medications: Yes She feels markedly improved no shortness of breath less weakness no edema chest pain palpitation.  She follows heart rate and blood pressure at home and has had no episodes of rapid or irregular heart rhythm or hypotension off ARNI Past Medical History:  Diagnosis Date  . COPD (chronic obstructive pulmonary disease) ([place])   . Depression   . HTN (hypertension)   . PONV (postoperative nausea and vomiting)     Past Surgical History:  Procedure Laterality Date  . ABDOMINAL HYSTERECTOMY    . CARDIOVERSION N/A 06/13/2017   Procedure: CARDIOVERSION;  Surgeon: Lelon Perla, MD;  Location: [facility] ENDOSCOPY;  Service: Cardiovascular;  Laterality: N/A;  . CARDIOVERSION N/A 07/07/2017   Procedure: CARDIOVERSION;  Surgeon: Sanda Klein, MD;  Location: [facility] ENDOSCOPY;  Service: Cardiovascular;  Laterality: N/A;  . TEE WITHOUT CARDIOVERSION N/A 06/13/2017   Procedure: TRANSESOPHAGEAL ECHOCARDIOGRAM (TEE);  Surgeon: Lelon Perla, MD;  Location: [facility] ENDOSCOPY;  Service: Cardiovascular;  Laterality: N/A;    Current Medications: Current Meds  Medication Sig  . amiodarone (PACERONE) 400 MG tablet Take 1 tablet (400 mg total) by mouth 2 (two) times daily. Take twice daily  . apixaban (ELIQUIS) 5 MG TABS tablet Take 1 tablet (5 mg total) 2 (two) times daily by mouth.  Marland Kitchen atorvastatin (LIPITOR) 20 MG tablet Take 20 mg daily by mouth.  . budesonide-formoterol (SYMBICORT) 160-4.5 MCG/ACT inhaler Inhale 2 puffs 2 (two) times daily into the lungs.  Marland Kitchen CALCIUM ASCORBATE PO Take daily by mouth.  . carvedilol (COREG) 3.125 MG tablet Take 1 tablet (3.125 mg total) 2 (two) times daily with a meal by mouth.  . furosemide (LASIX) 20 MG tablet Take 1 tablet (20 mg total) 2 (two) times daily as needed by mouth for fluid.  Marland Kitchen loratadine (CLARITIN) 10 MG tablet Take 10 mg daily as needed by mouth for allergies.  Marland Kitchen ondansetron (ZOFRAN) 4 MG tablet Take 4 mg every 8 (eight) hours as needed by mouth for nausea or  vomiting.  . polyethylene glycol (MIRALAX / GLYCOLAX) packet Take 17 g daily as needed by mouth.  . ranitidine (ZANTAC) 150 MG tablet Take 150 mg daily as needed by mouth for heartburn.     Allergies:   Patient has no known allergies.   Social History   Socioeconomic History  . Marital status: Widowed    Spouse name: None  . Number of children: None  . Years of education: None  . Highest education level: None  Social Needs  . Financial resource strain: None  . Food insecurity - worry: None  . Food insecurity - inability:  None  . Transportation needs - medical: None  . Transportation needs - non-medical: None  Occupational History  . None  Tobacco Use  . Smoking status: Never Smoker  . Smokeless tobacco: Never Used  Substance and Sexual Activity  . Alcohol use: No    Frequency: Never  . Drug use: No  . Sexual activity: None  Other Topics Concern  . None  Social History Narrative  . None     Family History: The patient's family history includes AAA (abdominal aortic aneurysm) in her sister; Asthma in her father; Breast cancer in her sister; CAD in her mother; Cardiomyopathy in her mother; Diabetes in her brother and mother; Hypertension in her brother. ROS:   Please see the history of present illness.    All other systems reviewed and are negative.  EKGs/Labs/Other Studies Reviewed:    The following studies were reviewed today:  EKG:  EKG ordered today.  The ekg ordered today demonstrates sinus rhythm normal QT interval CXR: 07/03/17 minimal right pleural effusion Recent Labs: 06/13/2017: ALT 31 06/14/2017: Magnesium 2.0 06/27/2017: NT-Pro BNP 8,089 07/06/2017: BUN 14; Creatinine, Ser 0.98; Hemoglobin 13.2; Platelets 172; Potassium 4.3; Sodium 142  Recent Lipid Panel No results found for: CHOL, TRIG, HDL, CHOLHDL, VLDL, LDLCALC, LDLDIRECT  Physical Exam:    VS:  BP 130/88 (BP Location: Right Arm, Patient Position: Sitting, Cuff Size: Normal)   Pulse 68   Ht 5\' 5"  (1.651 m)   Wt 174 lb (78.9 kg)   SpO2 95%   BMI 28.96 kg/m     Wt Readings from Last 3 Encounters:  07/13/17 174 lb (78.9 kg)  07/06/17 180 lb 6.4 oz (81.8 kg)  06/20/17 172 lb (78 kg)     GEN: She appears much improved no longer looks ill well nourished, well developed in no acute distress HEENT: Normal NECK: No JVD; No carotid bruits LYMPHATICS: No lymphadenopathy CARDIAC: RRR, no murmurs, rubs, gallops RESPIRATORY:  Clear to auscultation without rales, wheezing or rhonchi  ABDOMEN: Soft, non-tender,  non-distended MUSCULOSKELETAL:  No edema; No deformity  SKIN: Warm and dry NEUROLOGIC:  Alert and oriented x 3 PSYCHIATRIC:  Normal affect    Signed, Shirlee More, MD  07/13/2017 2:35 PM    [facility]

## 2017-07-17 ENCOUNTER — Other Ambulatory Visit: Payer: Self-pay

## 2017-07-17 NOTE — Patient Outreach (Signed)
[facility]) Care Management  07/17/2017  Carmin Alvidrez [facility] 07/24/1934 [id number]     EMMI-HF RED ON EMMI ALERT Day # 29 Date: 07/15/17 Red Alert Reason: " weight: 176 lbs Previous weight: 173 lbs   Outreach attempt # 1 to patient. Spoke with patient. Reviewed and addressed red alert. She reports she can not get the hang of it to record her accurate weight. She voices that weight was 168 lbs the past few days. She voices when automated call came through she didn't feel like getting up to check her weight log so she just "guessed a number." She denies any weight gain of 3 or more pounds. She is aware when to alert MD of changes in weight. Patient denies any SOB and/or edema.She is aware that she will continue to get automated EMMI-HF post discharge calls. No further RN CM interventions needed at this time.      Plan: RN CM will notify [facility] administrative assistant of case status.    Enzo [place], RN,BSN,CCM [place] [facility] Management Telephonic Care Management Coordinator Direct Phone: [phone] Toll Free: [phone] Fax: [phone]

## 2017-07-18 ENCOUNTER — Other Ambulatory Visit: Payer: Self-pay

## 2017-07-18 NOTE — Patient Outreach (Signed)
[facility]) Care Management  07/18/2017  Eldora Napp [facility] 10/04/1933 [id number]       EMMI-HF RED ON EMMI ALERT Day # 31 Date: 07/17/17 Red Alert Reason: " Weight: 168 lbs"    Red on EMMI dashboard received. No outreach call warranted to patient at this time. RN CM addressed issue on previous call.       Plan: RN CM will notify [facility] administrative assistant of case status.   Enzo [place], RN,BSN,CCM [facility] Management Telephonic Care Management Coordinator Direct Phone: [phone] Toll Free: [phone] Fax: [phone]

## 2017-07-22 ENCOUNTER — Telehealth: Payer: Self-pay | Admitting: Internal Medicine

## 2017-07-22 ENCOUNTER — Telehealth: Payer: Self-pay | Admitting: Physician Assistant

## 2017-07-22 NOTE — Telephone Encounter (Signed)
Patient called back after attempting the additional dose of the coreg 3.125mg , BID and amiodarone 2 extra pills of the 200mg  with improvement of the HR to 129 with SBP remaining in the 120's. Per son, patient remains asymptomatic and they would like to try the night time dose of the coreg to see if the HR's continue to improve prior to bringing her in. They don't feel she needs to come to the hospital at this time and I discussed with them that if there if any change in clinical status or the HR's remain/sustain high to please bring her in.

## 2017-07-22 NOTE — Telephone Encounter (Signed)
[name] called because her mother has undoubtedly gone back into atrial fibrillation.  Her heart rate is 139.  She feels her heart racing but is otherwise asymptomatic. She denies chest pain or shortness of breath.  She is not weak or lightheaded.  Based on Dr. Joya Gaskins instructions, they decreased the amiodarone from 200 mg twice daily down to 200 mg daily on 12/21.  [name] does not wish to come to the hospital.  Her blood pressure is 132/80.  Plan: [name] should not stay by herself tonight.  The daughter says that is not a problem.  Take 2 additional amiodarone tablets now.  Take 1 extra carvedilol 3.125 mg tablet now.  2 hours after she takes the extra medications, reassess. If she is asymptomatic and her heart rate and blood pressure are normal, nothing further.  If her heart rate is still elevated and she is still feeling her heart racing, she needs to come to the hospital.  Even if she is asymptomatic.  They prefer to bring her to [facility] instead of going to [place].  If at any time, she gets chest pain, shortness of breath or any other symptoms, come straight to the hospital.  The daughter is in agreement with this plan of care.  Rosaria Ferries, PA-C 07/22/2017 3:33 PM Beeper [phone]

## 2017-07-23 ENCOUNTER — Other Ambulatory Visit: Payer: Self-pay

## 2017-07-23 ENCOUNTER — Encounter (HOSPITAL_COMMUNITY): Payer: Self-pay | Admitting: *Deleted

## 2017-07-23 ENCOUNTER — Emergency Department (HOSPITAL_COMMUNITY)
Admission: EM | Admit: 2017-07-23 | Discharge: 2017-07-23 | Disposition: A | Discharge status: Home / Self Care | Payer: PPO | Attending: Emergency Medicine | Admitting: Emergency Medicine

## 2017-07-23 ENCOUNTER — Emergency Department (HOSPITAL_COMMUNITY): Payer: PPO

## 2017-07-23 ENCOUNTER — Telehealth: Payer: Self-pay | Admitting: Physician Assistant

## 2017-07-23 DIAGNOSIS — R002 Palpitations: Secondary | ICD-10-CM | POA: Diagnosis present

## 2017-07-23 DIAGNOSIS — I4892 Unspecified atrial flutter: Secondary | ICD-10-CM | POA: Diagnosis not present

## 2017-07-23 DIAGNOSIS — I110 Hypertensive heart disease with heart failure: Secondary | ICD-10-CM | POA: Diagnosis not present

## 2017-07-23 DIAGNOSIS — R0602 Shortness of breath: Secondary | ICD-10-CM | POA: Diagnosis not present

## 2017-07-23 DIAGNOSIS — Z79899 Other long term (current) drug therapy: Secondary | ICD-10-CM | POA: Insufficient documentation

## 2017-07-23 DIAGNOSIS — I5022 Chronic systolic (congestive) heart failure: Secondary | ICD-10-CM | POA: Insufficient documentation

## 2017-07-23 DIAGNOSIS — J449 Chronic obstructive pulmonary disease, unspecified: Secondary | ICD-10-CM | POA: Insufficient documentation

## 2017-07-23 DIAGNOSIS — R000 Tachycardia, unspecified: Secondary | ICD-10-CM | POA: Diagnosis not present

## 2017-07-23 LAB — CBC
HEMATOCRIT: 44.5 % (ref 36.0–46.0)
HEMOGLOBIN: 14.7 g/dL (ref 12.0–15.0)
MCH: 29.8 pg (ref 26.0–34.0)
MCHC: 33 g/dL (ref 30.0–36.0)
MCV: 90.1 fL (ref 78.0–100.0)
Platelets: 190 10*3/uL (ref 150–400)
RBC: 4.94 MIL/uL (ref 3.87–5.11)
RDW: 16.3 % — AB (ref 11.5–15.5)
WBC: 8.9 10*3/uL (ref 4.0–10.5)

## 2017-07-23 LAB — BASIC METABOLIC PANEL
ANION GAP: 10 (ref 5–15)
BUN: 13 mg/dL (ref 6–20)
CHLORIDE: 105 mmol/L (ref 101–111)
CO2: 23 mmol/L (ref 22–32)
Calcium: 9.2 mg/dL (ref 8.9–10.3)
Creatinine, Ser: 0.83 mg/dL (ref 0.44–1.00)
GFR calc Af Amer: >60 mL/min (ref >60)
Glucose, Bld: 131 mg/dL — ABNORMAL HIGH (ref 65–99)
POTASSIUM: 4.1 mmol/L (ref 3.5–5.1)
SODIUM: 138 mmol/L (ref 135–145)

## 2017-07-23 LAB — I-STAT TROPONIN, ED: Troponin i, poc: 0 ng/mL (ref 0.00–0.08)

## 2017-07-23 MED ORDER — APIXABAN 5 MG PO TABS
5.0000 mg | ORAL_TABLET | Freq: Two times a day (BID) | ORAL | Status: DC
  Administered 2017-07-23: 5 mg via ORAL
  Filled 2017-07-23: qty 1

## 2017-07-23 MED ORDER — AMIODARONE HCL 200 MG PO TABS
400.0000 mg | ORAL_TABLET | Freq: Every day | ORAL | Status: DC
  Administered 2017-07-23: 400 mg via ORAL
  Filled 2017-07-23: qty 2

## 2017-07-23 MED ORDER — CARVEDILOL 3.125 MG PO TABS
3.1250 mg | ORAL_TABLET | Freq: Two times a day (BID) | ORAL | Status: DC
  Filled 2017-07-23: qty 1

## 2017-07-23 MED ORDER — PROPOFOL 10 MG/ML IV BOLUS
INTRAVENOUS | Status: AC | PRN
  Administered 2017-07-23: 50 mg via INTRAVENOUS

## 2017-07-23 MED ORDER — PROPOFOL 10 MG/ML IV BOLUS
0.5000 mg/kg | Freq: Once | INTRAVENOUS | Status: AC
Start: 2017-07-23 — End: 2017-07-23
  Administered 2017-07-23: 39.5 mg via INTRAVENOUS
  Filled 2017-07-23: qty 20

## 2017-07-23 NOTE — Sedation Documentation (Signed)
Shocked at 120 J 

## 2017-07-23 NOTE — Telephone Encounter (Signed)
Pt son called because pt never went back into rhythm. She took the extra amio and Coreg as requested. She still is not having any CP/SOB, not light-headed or dizzy. BP is ok. However, HR is still 129.  Advised that with EF 40-45% and TR, she should not stay in this rhythm any longer. Requested they come to the [facility] ER. Requested she not eat (has not eaten yet)  Told them the ER MD might be able to cardiovert her if she has not missed any Eliquis doses. Son will ask pt and make sure.  He agrees with plan, they will come to [facility].  Rosaria Ferries, PA-C 07/23/2017 8:10 AM Beeper [phone]

## 2017-07-23 NOTE — ED Provider Notes (Signed)
[facility] EMERGENCY DEPARTMENT Provider Note   CSN: [id number] Arrival date & time: 07/23/17  3382     History   Chief Complaint Chief Complaint  Patient presents with  . Atrial Fibrillation    HPI Stephanie Douglas is a 83 y.o. female.  The history is provided by the patient. No language interpreter was used.  Atrial Fibrillation     Stephanie Douglas is a 83 y.o. female who presents to the Emergency Department complaining of rotations.  She has a history of atrial fibrillation and was admitted to the hospital for treatment about a month and a half ago.  Yesterday morning she noted that she was feeling palpitations with increased shortness of breath and dyspnea on exertion as well as intermittent chest tightness with exertion.  Last night she took an extra dose of amiodarone as well as an extra dose of carvedilol.  This morning she had persistent symptoms so she presented for evaluation.  She does take Eliquis daily and she has been compliant with all doses except for this morning.  She denies any fevers, nausea, vomiting, abdominal pain, leg swelling or pain.  She does have a slight cough.  Past Medical History:  Diagnosis Date  . COPD (chronic obstructive pulmonary disease) ([place])   . Depression   . HTN (hypertension)   . PONV (postoperative nausea and vomiting)     Patient Active Problem List   Diagnosis Date Noted  . Persistent atrial fibrillation ([place])   . Atypical atrial flutter ([place]) 07/06/2017  . Tricuspid regurgitation 06/19/2017  . Chronic anticoagulation 06/19/2017  . Paroxysmal atrial fibrillation ([place]) 06/12/2017  . Chronic systolic heart failure ([place]) 06/12/2017  . Pleural effusion on right 06/12/2017  . Hyponatremia 06/12/2017  . Transaminitis 06/12/2017  . Hypertensive heart disease with heart failure ([place]) 06/12/2017  . COPD (chronic obstructive pulmonary disease) ([place]) 06/12/2017    Past Surgical History:  Procedure Laterality Date  .  ABDOMINAL HYSTERECTOMY    . CARDIOVERSION N/A 06/13/2017   Procedure: CARDIOVERSION;  Surgeon: Lelon Perla, MD;  Location: [facility] ENDOSCOPY;  Service: Cardiovascular;  Laterality: N/A;  . CARDIOVERSION N/A 07/07/2017   Procedure: CARDIOVERSION;  Surgeon: Sanda Klein, MD;  Location: [facility] ENDOSCOPY;  Service: Cardiovascular;  Laterality: N/A;  . TEE WITHOUT CARDIOVERSION N/A 06/13/2017   Procedure: TRANSESOPHAGEAL ECHOCARDIOGRAM (TEE);  Surgeon: Lelon Perla, MD;  Location: [facility] ENDOSCOPY;  Service: Cardiovascular;  Laterality: N/A;    OB History    No data available       Home Medications    Prior to Admission medications   Medication Sig Start Date End Date Taking? Authorizing Provider  amiodarone (PACERONE) 400 MG tablet Take 1 tablet (400 mg total) by mouth 2 (two) times daily. Reduce to once daily after 07/21/17 07/13/17   Richardo Priest, MD  apixaban (ELIQUIS) 5 MG TABS tablet Take 1 tablet (5 mg total) by mouth 2 (two) times daily. 07/13/17   Richardo Priest, MD  atorvastatin (LIPITOR) 20 MG tablet Take 1 tablet (20 mg total) by mouth daily. 07/13/17   Richardo Priest, MD  CALCIUM ASCORBATE PO Take daily by mouth.    [provider]  carvedilol (COREG) 3.125 MG tablet Take 1 tablet (3.125 mg total) by mouth 2 (two) times daily with a meal. 07/13/17   Munley, Hilton Cork, MD  Fluticasone-Umeclidin-Vilant (TRELEGY ELLIPTA) 100-62.5-25 MCG/INH AEPB Inhale into the lungs.    [provider]  furosemide (LASIX) 20 MG tablet Take  1 tablet (20 mg total) by mouth 2 (two) times daily as needed for fluid. 07/13/17   Richardo Priest, MD  loratadine (CLARITIN) 10 MG tablet Take 10 mg daily as needed by mouth for allergies.    [provider]  ondansetron (ZOFRAN) 4 MG tablet Take 4 mg every 8 (eight) hours as needed by mouth for nausea or vomiting.    [provider]  polyethylene glycol (MIRALAX / GLYCOLAX) packet Take 17 g daily as needed by mouth.     [provider]  ranitidine (ZANTAC) 150 MG tablet Take 150 mg daily as needed by mouth for heartburn.    [provider]    Family History Family History  Problem Relation Age of Onset  . CAD Mother   . Cardiomyopathy Mother   . Diabetes Mother   . Asthma Father   . Breast cancer Sister   . Diabetes Brother   . Hypertension Brother   . AAA (abdominal aortic aneurysm) Sister     Social History Social History   Tobacco Use  . Smoking status: Never Smoker  . Smokeless tobacco: Never Used  Substance Use Topics  . Alcohol use: No    Frequency: Never  . Drug use: No     Allergies   Patient has no known allergies.   Review of Systems Review of Systems  All other systems reviewed and are negative.    Physical Exam Updated Vital Signs BP 128/79   Pulse 69   Temp 97.6 F (36.4 C) (Oral)   Resp 19   SpO2 97%   Physical Exam  Constitutional: She is oriented to person, place, and time. She appears well-developed and well-nourished.  HENT:  Head: Normocephalic and atraumatic.  Cardiovascular: Regular rhythm.  No murmur heard. Tachycardic  Pulmonary/Chest: Effort normal and breath sounds normal. No respiratory distress.  Abdominal: Soft. There is no tenderness. There is no rebound and no guarding.  Musculoskeletal: She exhibits no edema or tenderness.  Neurological: She is alert and oriented to person, place, and time.  Skin: Skin is warm and dry.  Psychiatric: She has a normal mood and affect. Her behavior is normal.  Nursing note and vitals reviewed.    ED Treatments / Results  Labs (all labs ordered are listed, but only abnormal results are displayed) Labs Reviewed  BASIC METABOLIC PANEL - Abnormal; Notable for the following components:      Result Value   Glucose, Bld 131 (*)    All other components within normal limits  CBC - Abnormal; Notable for the following components:   RDW 16.3 (*)    All other components within normal limits    I-STAT TROPONIN, ED    EKG  EKG Interpretation  Date/Time:  Sunday July 23 2017 12:02:00 EST Ventricular Rate:  80 PR Interval:  148 QRS Duration: 86 QT Interval:  438 QTC Calculation: 445 R Axis:   -50 Text Interpretation:  Sinus rhythm Atrial premature complexes Left anterior fascicular block Abnormal R-wave progression, late transition Borderline repolarization abnormality Confirmed by Quintella Reichert [phone]) on 07/23/2017 12:36:31 PM       Radiology Dg Chest Portable 1 View  Result Date: 07/23/2017 CLINICAL DATA:  Shortness of breath. EXAM: PORTABLE CHEST 1 VIEW COMPARISON:  Radiographs of July 03, 2017. FINDINGS: Stable cardiomediastinal silhouette. Atherosclerosis of thoracic aorta is noted. No pneumothorax or pleural effusion is noted. Right lung is clear. Mild left basilar atelectasis or scarring is noted. Bony thorax is unremarkable. IMPRESSION: Aortic atherosclerosis.  Mild left basilar subsegmental atelectasis or scarring. Electronically Signed   By: Marijo Conception, M.D.   On: 07/23/2017 10:17    Procedures .Sedation Date/Time: 07/23/2017 12:18 PM Performed by: Quintella Reichert, MD Authorized by: Quintella Reichert, MD   Consent:    Consent obtained:  Verbal   Consent given by:  Patient   Risks discussed:  Allergic reaction, dysrhythmia, inadequate sedation, nausea, prolonged hypoxia resulting in organ damage, prolonged sedation necessitating reversal, respiratory compromise necessitating ventilatory assistance and intubation and vomiting   Alternatives discussed:  Analgesia without sedation, anxiolysis and regional anesthesia Universal protocol:    Procedure explained and questions answered to patient or proxy's satisfaction: yes     Relevant documents present and verified: yes     Test results available and properly labeled: yes     Imaging studies available: yes     Required blood products, implants, devices, and special equipment available: yes      Site/side marked: yes     Immediately prior to procedure a time out was called: yes     Patient identity confirmation method:  Verbally with patient Indications:    Procedure necessitating sedation performed by:  Physician performing sedation   Intended level of sedation:  Deep Pre-sedation assessment:    Time since last food or drink:  Yesterday   ASA classification: class 3 - patient with severe systemic disease     Neck mobility: normal     Mouth opening:  3 or more finger widths   Thyromental distance:  4 finger widths   Mallampati score:  II - soft palate, uvula, fauces visible   Pre-sedation assessments completed and reviewed: airway patency, cardiovascular function, hydration status, mental status, nausea/vomiting, pain level, respiratory function and temperature   Immediate pre-procedure details:    Reassessment: Patient reassessed immediately prior to procedure     Reviewed: vital signs, relevant labs/tests and NPO status     Verified: bag valve mask available, emergency equipment available, intubation equipment available, IV patency confirmed, oxygen available and suction available   Procedure details (see MAR for exact dosages):    Preoxygenation:  Nasal cannula   Sedation:  Propofol   Intra-procedure monitoring:  Blood pressure monitoring, cardiac monitor, continuous pulse oximetry, frequent LOC assessments, frequent vital sign checks and continuous capnometry   Intra-procedure events: none     Total Provider sedation time (minutes):  12 Post-procedure details:    Attendance: Constant attendance by certified staff until patient recovered     Recovery: Patient returned to pre-procedure baseline     Post-sedation assessments completed and reviewed: airway patency, cardiovascular function, hydration status, mental status, nausea/vomiting, pain level, respiratory function and temperature     Patient is stable for discharge or admission: yes     Patient tolerance:  Tolerated well,  no immediate complications .Cardioversion Date/Time: 07/23/2017 11:59 AM Performed by: Quintella Reichert, MD Authorized by: Quintella Reichert, MD   Consent:    Consent obtained:  Verbal   Consent given by:  Patient   Risks discussed:  Cutaneous burn, induced arrhythmia and pain   Alternatives discussed:  Rate-control medication Pre-procedure details:    Cardioversion basis:  Emergent   Rhythm:  Atrial flutter   Electrode placement:  Anterior-posterior Patient sedated: Yes. Refer to sedation procedure documentation for details of sedation.  Attempt one:    Cardioversion mode:  Synchronous   Shock (Joules):  120   Shock outcome:  Conversion to normal sinus rhythm Post-procedure details:    Patient status:  Awake   Patient tolerance of procedure:  Tolerated well, no immediate complications   (including critical care time) CHA2DS2/VAS Stroke Risk Points      5 >= 2 Points: High Risk  1 - 1.99 Points: Medium Risk  0 Points: Low Risk    The previous score was 4 on 06/19/2017.:  Change: 5    Details    This score determines the patient's risk of having a stroke if the  patient has atrial fibrillation.       Points Metrics  1 Has Congestive Heart Failure:  Yes   0 Has Vascular Disease:  No   1 Has Hypertension:  Yes   2 Age:  43   0 Has Diabetes:  No   0 Had Stroke:  No  Had TIA:  No  Had thromboembolism:  No   1 Female:  Yes            Medications Ordered in ED Medications  apixaban (ELIQUIS) tablet 5 mg (5 mg Oral Given 07/23/17 1300)  carvedilol (COREG) tablet 3.125 mg (0 mg Oral Hold 07/23/17 1300)  amiodarone (PACERONE) tablet 400 mg (400 mg Oral Given 07/23/17 1309)  propofol (DIPRIVAN) 10 mg/mL bolus/IV push 39.5 mg (39.5 mg Intravenous Given 07/23/17 1143)  propofol (DIPRIVAN) 10 mg/mL bolus/IV push (50 mg Intravenous Given 07/23/17 1200)     Initial Impression / Assessment and Plan / ED Course  I have reviewed the triage vital signs and the nursing  notes.  Pertinent labs & imaging results that were available during my care of the patient were reviewed by me and considered in my medical decision making (see chart for details).    Patient here for evaluation of palpitations and shortness of breath.  EKG with atrial flutter versus A. fib.  No evidence of acute CHF or significant electrolyte abnormality.  Presentation is not consistent with ACS or PE.  She has been compliant with her anticoagulation at home.  She was electrically cardioverted in the emergency department with return to sinus rhythm.  Plan to discharge home with close cardiology follow-up.  Return precautions discussed.  Final Clinical Impressions(s) / ED Diagnoses   Final diagnoses:  Atrial flutter with rapid ventricular response [facility])    ED Discharge Orders    None       Quintella Reichert, MD 07/23/17 1645

## 2017-07-23 NOTE — ED Notes (Signed)
Patient given discharge instructions and verbalized understanding.  Patient stable to discharge at this time.  Patient is alert and oriented to baseline.  No distressed noted at this time.  All belongings taken with the patient at discharge.   

## 2017-07-23 NOTE — ED Triage Notes (Signed)
Pt has hx of afib, was cardioverted on 12/7. Reports HR being irregular and tachy this am. No acute distress is noted this am.

## 2017-07-23 NOTE — ED Notes (Signed)
Pt states feeling much better

## 2017-07-24 ENCOUNTER — Telehealth (HOSPITAL_COMMUNITY): Payer: Self-pay | Admitting: *Deleted

## 2017-07-24 NOTE — Telephone Encounter (Signed)
Spoke to pt about recent dccv in the ED.  Pt states she is feeling much better and taking her blood thinner twice a day as prescribed.  Pt was advised to call Dr. Oren Binet office to possibly move appt up from 1/10 to be followed sooner. Pt understood and will have her daughter call Dr. Oren Binet office

## 2017-07-27 DIAGNOSIS — Z79899 Other long term (current) drug therapy: Secondary | ICD-10-CM | POA: Insufficient documentation

## 2017-07-27 NOTE — Progress Notes (Signed)
Cardiology Office Note:    Date:  07/28/2017   ID:  Stephanie Douglas, DOB 1934/01/21, MRN [id number]  PCP:  Stephanie Anes, MD  Cardiologist:  Stephanie More, MD    Referring MD: Stephanie Douglas,*    ASSESSMENT:    1. Paroxysmal atrial fibrillation (HCC)   2. On amiodarone therapy   3. Chronic anticoagulation   4. Chronic systolic heart failure (HCC)    PLAN:    In order of problems listed above:  1. Continue amiodarone anticoagulation if she recurs with a therapeutic level refer to EP for consideration of catheter ablation despite her advanced age. 2. Continue present dose of amiodarone level pending she has no signs of toxicity 3. Continue her anticoagulation.  In the future she has recurrent atrial fibrillation even if being n.p.o. I told her to take her anticoagulant with water. 4. Stable compensated continue current treatment.  Recheck echocardiogram around the time of her next visit   Next appointment: 4 weeks   Medication Adjustments/Labs and Tests Ordered: Current medicines are reviewed at length with the patient today.  Concerns regarding medicines are outlined above.  Orders Placed This Encounter  Procedures  . Amiodarone level   Meds ordered this encounter  Medications  . amiodarone (PACERONE) 400 MG tablet    Sig: Take 1 tablet (400 mg total) by mouth 2 (two) times daily. Reduce to once daily after 07/21/17    Dispense:  60 tablet    Refill:  3    Chief Complaint  Patient presents with  . 1 month follow up    History of Present Illness:    Stephanie Douglas is a 83 y.o. female with a hx of PAF, cardiomyopathy EF 35-40% with heart failure last seen 2 weeks ago and cardioverted in ED 07/23/17. She had been started on oral amiodarone 07/06/17 and has had a total of 3 CV. Compliance with diet, lifestyle and medications: Yes She had recurrent atrial fibrillation had a recent cardioversion was called Hospital ED protocol.  Other than that she feels  well she is not short of breath no palpitations since that event no exercise intolerance edema orthopnea.  She is taking amiodarone 800 mg/day and is pleased with the quality of her life.  We will check an amiodarone level today to decide on dosing if she has another clinical recurrence while on amiodarone refer for EP consideration of catheter ablation.  In the near future recheck her echocardiogram. Past Medical History:  Diagnosis Date  . COPD (chronic obstructive pulmonary disease) ([place])   . Depression   . HTN (hypertension)   . PONV (postoperative nausea and vomiting)     Past Surgical History:  Procedure Laterality Date  . ABDOMINAL HYSTERECTOMY    . CARDIOVERSION N/A 06/13/2017   Procedure: CARDIOVERSION;  Surgeon: Lelon Perla, MD;  Location: [facility] ENDOSCOPY;  Service: Cardiovascular;  Laterality: N/A;  . CARDIOVERSION N/A 07/07/2017   Procedure: CARDIOVERSION;  Surgeon: Sanda Klein, MD;  Location: [facility] ENDOSCOPY;  Service: Cardiovascular;  Laterality: N/A;  . TEE WITHOUT CARDIOVERSION N/A 06/13/2017   Procedure: TRANSESOPHAGEAL ECHOCARDIOGRAM (TEE);  Surgeon: Lelon Perla, MD;  Location: [facility] ENDOSCOPY;  Service: Cardiovascular;  Laterality: N/A;    Current Medications: Current Meds  Medication Sig  . amiodarone (PACERONE) 400 MG tablet Take 1 tablet (400 mg total) by mouth 2 (two) times daily. Reduce to once daily after 07/21/17  . apixaban (ELIQUIS) 5 MG TABS tablet Take 1 tablet (5 mg total) by mouth  2 (two) times daily.  Marland Kitchen atorvastatin (LIPITOR) 20 MG tablet Take 1 tablet (20 mg total) by mouth daily.  Marland Kitchen CALCIUM ASCORBATE PO Take daily by mouth.  . carvedilol (COREG) 3.125 MG tablet Take 1 tablet (3.125 mg total) by mouth 2 (two) times daily with a meal.  . Fluticasone-Umeclidin-Vilant (TRELEGY ELLIPTA) 100-62.5-25 MCG/INH AEPB Inhale into the lungs.  . furosemide (LASIX) 20 MG tablet Take 1 tablet (20 mg total) by mouth 2 (two) times daily as needed for fluid.  Marland Kitchen  loratadine (CLARITIN) 10 MG tablet Take 10 mg daily as needed by mouth for allergies.  Marland Kitchen ondansetron (ZOFRAN) 4 MG tablet Take 4 mg every 8 (eight) hours as needed by mouth for nausea or vomiting.  . polyethylene glycol (MIRALAX / GLYCOLAX) packet Take 17 g daily as needed by mouth.  . ranitidine (ZANTAC) 150 MG tablet Take 150 mg daily as needed by mouth for heartburn.  . [DISCONTINUED] amiodarone (PACERONE) 400 MG tablet Take 1 tablet (400 mg total) by mouth 2 (two) times daily. Reduce to once daily after 07/21/17 (Patient taking differently: Take 400 mg by mouth 2 (two) times daily. )     Allergies:   Patient has no known allergies.   Social History   Socioeconomic History  . Marital status: Widowed    Spouse name: None  . Number of children: None  . Years of education: None  . Highest education level: None  Social Needs  . Financial resource strain: None  . Food insecurity - worry: None  . Food insecurity - inability: None  . Transportation needs - medical: None  . Transportation needs - non-medical: None  Occupational History  . None  Tobacco Use  . Smoking status: Never Smoker  . Smokeless tobacco: Never Used  Substance and Sexual Activity  . Alcohol use: No    Frequency: Never  . Drug use: No  . Sexual activity: None  Other Topics Concern  . None  Social History Narrative  . None     Family History: The patient's family history includes AAA (abdominal aortic aneurysm) in her sister; Asthma in her father; Breast cancer in her sister; CAD in her mother; Cardiomyopathy in her mother; Diabetes in her brother and mother; Hypertension in her brother. ROS:   Please see the history of present illness.    All other systems reviewed and are negative.  EKGs/Labs/Other Studies Reviewed:    The following studies were reviewed today:  EKG:  EKG ordered today.  The ekg ordered today demonstrates STH normal EKG  Recent Labs: 06/13/2017: ALT 31 06/14/2017: Magnesium  2.0 06/27/2017: NT-Pro BNP 8,089 07/23/2017: BUN 13; Creatinine, Ser 0.83; Hemoglobin 14.7; Platelets 190; Potassium 4.1; Sodium 138  Recent Lipid Panel No results found for: CHOL, TRIG, HDL, CHOLHDL, VLDL, LDLCALC, LDLDIRECT  Physical Exam:    VS:  BP 130/72   Pulse 66   Ht 5\' 5"  (1.651 m)   Wt 174 lb (78.9 kg)   SpO2 94%   BMI 28.96 kg/m     Wt Readings from Last 3 Encounters:  07/28/17 174 lb (78.9 kg)  07/13/17 174 lb (78.9 kg)  07/06/17 180 lb 6.4 oz (81.8 kg)     GEN:  Well nourished, well developed in no acute distress HEENT: Normal NECK: No JVD; No carotid bruits LYMPHATICS: No lymphadenopathy CARDIAC:  RRR, no murmurs, rubs, gallops RESPIRATORY:  Clear to auscultation without rales, wheezing or rhonchi  ABDOMEN: Soft, non-tender, non-distended MUSCULOSKELETAL:  No edema; No deformity  SKIN: Warm and dry NEUROLOGIC:  Alert and oriented x 3 PSYCHIATRIC:  Normal affect    Signed, Stephanie More, MD  07/28/2017 4:49 PM    [facility]

## 2017-07-28 ENCOUNTER — Encounter: Payer: Self-pay | Admitting: Cardiology

## 2017-07-28 ENCOUNTER — Ambulatory Visit (INDEPENDENT_AMBULATORY_CARE_PROVIDER_SITE_OTHER): Payer: PPO | Admitting: Cardiology

## 2017-07-28 VITALS — BP 130/72 | HR 66 | Ht 65.0 in | Wt 174.0 lb

## 2017-07-28 DIAGNOSIS — I480 Paroxysmal atrial fibrillation: Secondary | ICD-10-CM

## 2017-07-28 DIAGNOSIS — Z7901 Long term (current) use of anticoagulants: Secondary | ICD-10-CM | POA: Diagnosis not present

## 2017-07-28 DIAGNOSIS — I5022 Chronic systolic (congestive) heart failure: Secondary | ICD-10-CM | POA: Diagnosis not present

## 2017-07-28 DIAGNOSIS — Z79899 Other long term (current) drug therapy: Secondary | ICD-10-CM | POA: Diagnosis not present

## 2017-07-28 MED ORDER — AMIODARONE HCL 400 MG PO TABS: 400.0000 mg | ORAL_TABLET | Freq: Two times a day (BID) | ORAL | 3 refills | Status: DC

## 2017-07-28 NOTE — Patient Instructions (Signed)
Medication Instructions:  Your physician recommends that you continue on your current medications as directed. Please refer to the Current Medication list given to you today.  Labwork: Your physician recommends that you have the following labs drawn: Amiodarone level  Testing/Procedures: None  Follow-Up: Your physician recommends that you schedule a follow-up appointment in: 4 weeks  Any Other Special Instructions Will Be Listed Below (If Applicable).     If you need a refill on your cardiac medications before your next appointment, please call your pharmacy.   [place], RN, BSN

## 2017-07-31 LAB — AMIODARONE LEVEL
Amiodarone, Serum: 1.1 ug/mL (ref 1.0–2.5)
Noramiodarone,S: 0.5 ug/mL — ABNORMAL LOW (ref 1.0–2.5)

## 2017-07-31 NOTE — Addendum Note (Signed)
Addended by: Jossie Ng on: 07/31/2017 04:35 PM   Modules accepted: Orders

## 2017-08-07 ENCOUNTER — Ambulatory Visit (HOSPITAL_BASED_OUTPATIENT_CLINIC_OR_DEPARTMENT_OTHER)
Admission: RE | Admit: 2017-08-07 | Discharge: 2017-08-07 | Disposition: A | Discharge status: Home / Self Care | Payer: PPO | Source: Ambulatory Visit | Attending: Cardiology | Admitting: Cardiology

## 2017-08-07 DIAGNOSIS — I081 Rheumatic disorders of both mitral and tricuspid valves: Secondary | ICD-10-CM | POA: Diagnosis not present

## 2017-08-07 DIAGNOSIS — I10 Essential (primary) hypertension: Secondary | ICD-10-CM | POA: Diagnosis not present

## 2017-08-07 DIAGNOSIS — I480 Paroxysmal atrial fibrillation: Secondary | ICD-10-CM | POA: Diagnosis not present

## 2017-08-07 LAB — ECHOCARDIOGRAM COMPLETE
AV Mean grad: 5 mmHg
AV Peak grad: 10 mmHg
AV pk vel: 161 cm/s
AV vel: 1.31
AVAREAMEANV: 1.55 cm2
AVAREAMEANVIN: 0.84 cm2/m2
AVAREAVTI: 1.41 cm2
AVAREAVTIIND: 0.71 cm2/m2
AVCELMEANRAT: 0.77
AVLVOTPG: 5 mmHg
Ao pk vel: 0.7 m/s
Ao-asc: 39 cm
CHL CUP AV PEAK INDEX: 0.76
CHL CUP TV REG PEAK VELOCITY: 265 cm/s
DOP CAL AO MEAN VELOCITY: 107 cm/s
EERAT: 12.89
EWDT: 331 ms
FS: 25 % — AB (ref 28–44)
IVS/LV PW RATIO, ED: 1
LA ID, A-P, ES: 39 mm
LA diam index: 2.11 cm/m2
LA vol A4C: 70.3 ml
LA vol index: 36.5 mL/m2
LA vol: 67.5 mL
LDCA: 2.01 cm2
LEFT ATRIUM END SYS DIAM: 39 mm
LV E/e' medial: 12.89
LV E/e'average: 12.89
LV PW d: 13.8 mm — AB (ref 0.6–1.1)
LV TDI E'LATERAL: 6.96
LV TDI E'MEDIAL: 4.03
LV e' LATERAL: 6.96 cm/s
LVOT SV: 45 mL
LVOT VTI: 22.5 cm
LVOTD: 16 mm
LVOTPV: 113 cm/s
LVOTVTI: 0.65 cm
Lateral S' vel: 9.79 cm/s
MV Dec: 331
MV pk E vel: 89.7 m/s
MVPG: 3 mmHg
MVPKAVEL: 106 m/s
RV TAPSE: 30 mm
RV sys press: 31 mmHg
TRMAXVEL: 265 cm/s
VTI: 34.6 cm
Valve area index: 0.71
Valve area: 1.31 cm2

## 2017-08-07 NOTE — Progress Notes (Signed)
Echocardiogram 2D Echocardiogram has been performed.  Stephanie Douglas 08/07/2017, 4:02 PM

## 2017-08-09 DIAGNOSIS — J449 Chronic obstructive pulmonary disease, unspecified: Secondary | ICD-10-CM | POA: Diagnosis not present

## 2017-08-09 DIAGNOSIS — I480 Paroxysmal atrial fibrillation: Secondary | ICD-10-CM | POA: Diagnosis not present

## 2017-08-09 DIAGNOSIS — I5021 Acute systolic (congestive) heart failure: Secondary | ICD-10-CM | POA: Diagnosis not present

## 2017-08-09 DIAGNOSIS — E782 Mixed hyperlipidemia: Secondary | ICD-10-CM | POA: Diagnosis not present

## 2017-08-09 DIAGNOSIS — I110 Hypertensive heart disease with heart failure: Secondary | ICD-10-CM | POA: Diagnosis not present

## 2017-08-09 DIAGNOSIS — I10 Essential (primary) hypertension: Secondary | ICD-10-CM | POA: Diagnosis not present

## 2017-08-10 ENCOUNTER — Ambulatory Visit: Payer: PPO | Admitting: Cardiology

## 2017-08-25 ENCOUNTER — Ambulatory Visit (INDEPENDENT_AMBULATORY_CARE_PROVIDER_SITE_OTHER): Payer: PPO | Admitting: Cardiology

## 2017-08-25 ENCOUNTER — Encounter: Payer: Self-pay | Admitting: Cardiology

## 2017-08-25 VITALS — BP 124/78 | HR 69 | Ht 65.0 in | Wt 176.8 lb

## 2017-08-25 DIAGNOSIS — I110 Hypertensive heart disease with heart failure: Secondary | ICD-10-CM

## 2017-08-25 DIAGNOSIS — Z7901 Long term (current) use of anticoagulants: Secondary | ICD-10-CM

## 2017-08-25 DIAGNOSIS — Z79899 Other long term (current) drug therapy: Secondary | ICD-10-CM

## 2017-08-25 DIAGNOSIS — I480 Paroxysmal atrial fibrillation: Secondary | ICD-10-CM

## 2017-08-25 MED ORDER — FUROSEMIDE 20 MG PO TABS: 20.0000 mg | ORAL_TABLET | ORAL | 3 refills | Status: DC

## 2017-08-25 MED ORDER — AMIODARONE HCL 400 MG PO TABS: 400.0000 mg | ORAL_TABLET | Freq: Every day | ORAL | 3 refills | Status: DC

## 2017-08-25 NOTE — Patient Instructions (Addendum)
Medication Instructions:  Your physician has recommended you make the following change in your medication:  RESUME furosemide (Lasix) 20 mg three times weekly (MWF) DECREASE amiodarone to 400 mg daily  Labwork: Your physician recommends that you return for lab work in: today. BMP, BNP, TSH, amiodarone  Testing/Procedures: You had an EKG today.  Follow-Up: Your physician recommends that you schedule a follow-up appointment in: 6 weeks.  Any Other Special Instructions Will Be Listed Below (If Applicable).     If you need a refill on your cardiac medications before your next appointment, please call your pharmacy.    1. Avoid all over-the-counter antihistamines except Claritin/Loratadine and Zyrtec/Cetrizine. 2. Avoid all combination including cold sinus allergies flu decongestant and sleep medications 3. You can use Robitussin DM Mucinex and Mucinex DM for cough. 4. can use Tylenol aspirin ibuprofen and naproxen but no combinations such as sleep or sinus.

## 2017-08-25 NOTE — Progress Notes (Signed)
Cardiology Office Note:    Date:  08/25/2017   ID:  Stephanie Douglas, DOB 28-Jun-1934, MRN [id number]  PCP:  Lillard Anes, MD  Cardiologist:  Shirlee More, MD    Referring MD: Lillard Anes,*    ASSESSMENT:    1. Paroxysmal atrial fibrillation (HCC)   2. Hypertensive heart disease with heart failure ([place])   3. Chronic anticoagulation   4. On amiodarone therapy    PLAN:    In order of problems listed above:  1. Improved maintaining sinus rhythm reduce amiodarone 400 mg daily check levels and check EKG in the office today for QT interval.  She will continue anticoagulation 2. Stable she is off of diuretic and I will ask her to resume low-dose furosemide 3 days a week as her weight is increased at home although she is not short of breath 3. Continue her anticoagulant with atrial fibrillation and cardioversions 4. Continue amiodarone reduced dose see him back in the office in 6 weeks.   Next appointment: 6 weeks   Medication Adjustments/Labs and Tests Ordered: Current medicines are reviewed at length with the patient today.  Concerns regarding medicines are outlined above.  Orders Placed This Encounter  Procedures  . Amiodarone level  . TSH  . Basic metabolic panel  . Pro b natriuretic peptide (BNP)  . EKG 12-Lead   Meds ordered this encounter  Medications  . amiodarone (PACERONE) 400 MG tablet    Sig: Take 1 tablet (400 mg total) by mouth daily.    Dispense:  30 tablet    Refill:  3  . furosemide (LASIX) 20 MG tablet    Sig: Take 1 tablet (20 mg total) by mouth 3 (three) times a week.    Dispense:  90 tablet    Refill:  3    Chief Complaint  Patient presents with  . Follow-up    4 week flup   . Dizziness    History of Present Illness:    Stephanie Douglas is a 84 y.o. female with a hx of PAF, cardiomyopathy EF 35-40% with heart failure   cardioverted in ED 07/23/17. She had been started on oral amiodarone 07/06/17 and has had a total of 3 CV. To  maintain [place]. She was last seen one month ago.. Compliance with diet, lifestyle and medications: Yes Overall she is doing well still fatigues easily and her weight is up in the range of 3-5 pounds she no longer is on a loop diuretic.  She has had no recurrent atrial fibrillation shortness of breath orthopnea palpitations syncope or TIA.  Recent echocardiogram shows normalization of ejection fraction maintaining sinus rhythm Past Medical History:  Diagnosis Date  . COPD (chronic obstructive pulmonary disease) ([place])   . Depression   . HTN (hypertension)   . PONV (postoperative nausea and vomiting)     Past Surgical History:  Procedure Laterality Date  . ABDOMINAL HYSTERECTOMY    . CARDIOVERSION N/A 06/13/2017   Procedure: CARDIOVERSION;  Surgeon: Lelon Perla, MD;  Location: [facility] ENDOSCOPY;  Service: Cardiovascular;  Laterality: N/A;  . CARDIOVERSION N/A 07/07/2017   Procedure: CARDIOVERSION;  Surgeon: Sanda Klein, MD;  Location: [facility] ENDOSCOPY;  Service: Cardiovascular;  Laterality: N/A;  . TEE WITHOUT CARDIOVERSION N/A 06/13/2017   Procedure: TRANSESOPHAGEAL ECHOCARDIOGRAM (TEE);  Surgeon: Lelon Perla, MD;  Location: [facility] ENDOSCOPY;  Service: Cardiovascular;  Laterality: N/A;    Current Medications: Current Meds  Medication Sig  . amiodarone (PACERONE) 400 MG tablet Take 1  tablet (400 mg total) by mouth daily.  Marland Kitchen apixaban (ELIQUIS) 5 MG TABS tablet Take 1 tablet (5 mg total) by mouth 2 (two) times daily.  Marland Kitchen atorvastatin (LIPITOR) 20 MG tablet Take 1 tablet (20 mg total) by mouth daily.  Marland Kitchen CALCIUM ASCORBATE PO Take daily by mouth.  . carvedilol (COREG) 3.125 MG tablet Take 1 tablet (3.125 mg total) by mouth 2 (two) times daily with a meal.  . Fluticasone-Umeclidin-Vilant (TRELEGY ELLIPTA) 100-62.5-25 MCG/INH AEPB Inhale into the lungs daily as needed.   . furosemide (LASIX) 20 MG tablet Take 1 tablet (20 mg total) by mouth 3 (three) times a week.  . loratadine (CLARITIN) 10 MG  tablet Take 10 mg daily as needed by mouth for allergies.  Marland Kitchen ondansetron (ZOFRAN) 4 MG tablet Take 4 mg every 8 (eight) hours as needed by mouth for nausea or vomiting.  . polyethylene glycol (MIRALAX / GLYCOLAX) packet Take 17 g daily as needed by mouth.  . [DISCONTINUED] amiodarone (PACERONE) 400 MG tablet Take 1 tablet (400 mg total) by mouth 2 (two) times daily. Reduce to once daily after 07/21/17 (Patient taking differently: Take 400 mg by mouth daily. Reduce to once daily after 07/21/17)  . [DISCONTINUED] furosemide (LASIX) 20 MG tablet Take 1 tablet (20 mg total) by mouth 2 (two) times daily as needed for fluid.     Allergies:   Patient has no known allergies.   Social History   Socioeconomic History  . Marital status: Widowed    Spouse name: None  . Number of children: None  . Years of education: None  . Highest education level: None  Social Needs  . Financial resource strain: None  . Food insecurity - worry: None  . Food insecurity - inability: None  . Transportation needs - medical: None  . Transportation needs - non-medical: None  Occupational History  . None  Tobacco Use  . Smoking status: Never Smoker  . Smokeless tobacco: Never Used  Substance and Sexual Activity  . Alcohol use: No    Frequency: Never  . Drug use: No  . Sexual activity: None  Other Topics Concern  . None  Social History Narrative  . None     Family History: The patient's family history includes AAA (abdominal aortic aneurysm) in her sister; Asthma in her father; Breast cancer in her sister; CAD in her mother; Cardiomyopathy in her mother; Diabetes in her brother and mother; Hypertension in her brother. ROS:   Please see the history of present illness.    All other systems reviewed and are negative.  EKGs/Labs/Other Studies Reviewed:    The following studies were reviewed today:  EKG:  EKG ordered today.  The ekg ordered today will be reviewed prior to leaving the office  Echo TTE  08/07/17: Left ventricle:  The cavity size was normal. Wall thickness was increased in a pattern of moderate LVH. Systolic function was normal. Wall motion was normal; there were no regional wall motion abnormalities. Doppler parameters are consistent with abnormal left ventricular relaxation (grade 1 diastolic dysfunction).  Recent Labs: Amiodarone level 07/28/17 1.1/0.5 subtherapeutic 06/13/2017: ALT 31 06/14/2017: Magnesium 2.0 06/27/2017: NT-Pro BNP 8,089 07/23/2017: BUN 13; Creatinine, Ser 0.83; Hemoglobin 14.7; Platelets 190; Potassium 4.1; Sodium 138  Recent Lipid Panel No results found for: CHOL, TRIG, HDL, CHOLHDL, VLDL, LDLCALC, LDLDIRECT  Physical Exam:    VS:  BP 124/78 (BP Location: Left Arm, Patient Position: Sitting, Cuff Size: Large)   Pulse 69   Ht 5'  5" (1.651 m)   Wt 176 lb 12.8 oz (80.2 kg)   SpO2 96%   BMI 29.42 kg/m     Wt Readings from Last 3 Encounters:  08/25/17 176 lb 12.8 oz (80.2 kg)  07/28/17 174 lb (78.9 kg)  07/13/17 174 lb (78.9 kg)     GEN:  Well nourished, well developed in no acute distress HEENT: Normal NECK: No JVD; No carotid bruits LYMPHATICS: No lymphadenopathy CARDIAC: RRR, no murmurs, rubs, gallops RESPIRATORY:  Clear to auscultation without rales, wheezing or rhonchi  ABDOMEN: Soft, non-tender, non-distended MUSCULOSKELETAL:  No edema; No deformity  SKIN: Warm and dry NEUROLOGIC:  Alert and oriented x 3 PSYCHIATRIC:  Normal affect    Signed, Shirlee More, MD  08/25/2017 4:17 PM    [facility]

## 2017-08-26 LAB — BASIC METABOLIC PANEL
BUN/Creatinine Ratio: 22 (ref 12–28)
BUN: 17 mg/dL (ref 8–27)
CHLORIDE: 101 mmol/L (ref 96–106)
CO2: 24 mmol/L (ref 20–29)
Calcium: 9.7 mg/dL (ref 8.7–10.3)
Creatinine, Ser: 0.78 mg/dL (ref 0.57–1.00)
GFR calc Af Amer: 82 mL/min/{1.73_m2} (ref >59)
GFR calc non Af Amer: 72 mL/min/{1.73_m2} (ref >59)
Glucose: 86 mg/dL (ref 65–99)
POTASSIUM: 4.3 mmol/L (ref 3.5–5.2)
Sodium: 140 mmol/L (ref 134–144)

## 2017-08-26 LAB — TSH: TSH: 4.37 u[IU]/mL (ref 0.450–4.500)

## 2017-08-26 LAB — PRO B NATRIURETIC PEPTIDE: NT-PRO BNP: 172 pg/mL (ref 0–738)

## 2017-08-29 LAB — AMIODARONE LEVEL
AMIODARONE LVL: 0.8 ug/mL — AB (ref 1.0–2.5)
N-DESETHYL-AMIODARONE: 0.6 ug/mL — AB (ref 1.0–2.5)

## 2017-10-06 ENCOUNTER — Ambulatory Visit (INDEPENDENT_AMBULATORY_CARE_PROVIDER_SITE_OTHER): Payer: PPO | Admitting: Cardiology

## 2017-10-06 ENCOUNTER — Encounter: Payer: Self-pay | Admitting: Cardiology

## 2017-10-06 VITALS — BP 124/66 | HR 76 | Resp 10 | Ht 65.0 in | Wt 179.8 lb

## 2017-10-06 DIAGNOSIS — Z79899 Other long term (current) drug therapy: Secondary | ICD-10-CM | POA: Diagnosis not present

## 2017-10-06 DIAGNOSIS — I5042 Chronic combined systolic (congestive) and diastolic (congestive) heart failure: Secondary | ICD-10-CM | POA: Diagnosis not present

## 2017-10-06 DIAGNOSIS — I481 Persistent atrial fibrillation: Secondary | ICD-10-CM | POA: Diagnosis not present

## 2017-10-06 DIAGNOSIS — I110 Hypertensive heart disease with heart failure: Secondary | ICD-10-CM | POA: Diagnosis not present

## 2017-10-06 DIAGNOSIS — Z7901 Long term (current) use of anticoagulants: Secondary | ICD-10-CM

## 2017-10-06 DIAGNOSIS — I4819 Other persistent atrial fibrillation: Secondary | ICD-10-CM

## 2017-10-06 MED ORDER — AMIODARONE HCL 400 MG PO TABS: 200.0000 mg | ORAL_TABLET | Freq: Every day | ORAL | 3 refills | Status: DC

## 2017-10-06 MED ORDER — FUROSEMIDE 20 MG PO TABS: 20.0000 mg | ORAL_TABLET | ORAL | 3 refills | Status: DC

## 2017-10-06 NOTE — Addendum Note (Signed)
Addended by: Austin Miles on: 10/06/2017 04:17 PM   Modules accepted: Orders

## 2017-10-06 NOTE — Patient Instructions (Addendum)
Medication Instructions:  Your physician has recommended you make the following change in your medication:  DECREASE amiodarone 200 mg daily  Labwork: Your physician recommends that you return for lab work today: CMP, TSH.   Testing/Procedures: None  Follow-Up: Your physician wants you to follow-up in: 4 months. You will receive a reminder letter in the mail two months in advance. If you don't receive a letter, please call our office to schedule the follow-up appointment.    Any Other Special Instructions Will Be Listed Below (If Applicable).     If you need a refill on your cardiac medications before your next appointment, please call your pharmacy.     Heart Failure  Weigh yourself every morning when you first wake up and record on a calender or note pad, bring this to your office visits. Using a pill tender can help with taking your medications consistently.  Limit your fluid intake to 2 liters daily  Limit your sodium intake to less than 2-3 grams daily. Ask if you need dietary teaching.  If you gain more than 3 pounds (from your dry weight ), double your dose of diuretic for the day.  If you gain more than 5 pounds (from your dry weight), double your dose of lasix and call your heart failure doctor.  Please do not smoke tobacco since it is very bad for your heart.  Please do not drink alcohol since it can worsen your heart failure.Also avoid OTC nonsteroidal drugs, such as advil, aleve and motrin.  Try to exercise for at least 30 minutes every day because this will help your heart be more efficient. You may be eligible for supervised cardiac rehab, ask your physician.

## 2017-10-06 NOTE — Progress Notes (Signed)
Cardiology Office Note:    Date:  10/06/2017   ID:  Stephanie Douglas, DOB November 25, 1933, MRN [id number]  PCP:  Lillard Anes, MD  Cardiologist:  Shirlee More, MD    Referring MD: Lillard Anes,*    ASSESSMENT:    1. Persistent atrial fibrillation ([place])   2. On amiodarone therapy   3. Chronic anticoagulation   4. Chronic combined systolic and diastolic heart failure ([place])   5. Hypertensive heart disease with heart failure ([place])    PLAN:    In order of problems listed above:  1. Stable she is maintaining sinus rhythm continue amiodarone reduced dose of her anticoagulant.  She had 2 occasions where her blood pressure cuff city irregular but her rates were no different and she could not tell any clinical difference in the way she felt 2. Continue amiodarone reduced dose check labs for liver and thyroid toxicity 3. Continue her anticoagulant 4. Compensated continue current diuretic 5. Stable continue treatment including beta-blocker.   Next appointment: 3 months   Medication Adjustments/Labs and Tests Ordered: Current medicines are reviewed at length with the patient today.  Concerns regarding medicines are outlined above.  No orders of the defined types were placed in this encounter.  No orders of the defined types were placed in this encounter.   No chief complaint on file.   History of Present Illness:    Stephanie Douglas is a 84 y.o. female with a hx of atrial fibrillation heart failure and tachycardia induced cardiomyopathy last seen 08/25/17.  ASSESSMENT:   08/25/17   1. Paroxysmal atrial fibrillation (HCC)   2. Hypertensive heart disease with heart failure ([place])   3. Chronic anticoagulation   4. On amiodarone therapy    PLAN:    1.    Improved maintaining sinus rhythm reduce amiodarone 400 mg daily check levels and check EKG in the office today for QT interval.  She will continue anticoagulation 6. Stable she is off of diuretic and I will ask her to  resume low-dose furosemide 3 days a week as her weight is increased at home although she is not short of breath 7. Continue her anticoagulant with atrial fibrillation and cardioversions 8. Continue amiodarone reduced dose see him back in the office in 6 weeks  Compliance with diet, lifestyle and medications: Yes She feels well and has had no shortness of breath chest pain palpitation or syncope.  Weights are stable today is not since I have seen her there is an alert on a blood pressure cuff the pulse was irregular Past Medical History:  Diagnosis Date  . COPD (chronic obstructive pulmonary disease) ([place])   . Depression   . HTN (hypertension)   . PONV (postoperative nausea and vomiting)     Past Surgical History:  Procedure Laterality Date  . ABDOMINAL HYSTERECTOMY    . CARDIOVERSION N/A 06/13/2017   Procedure: CARDIOVERSION;  Surgeon: Lelon Perla, MD;  Location[facility] ENDOSCOPY;  Service: Cardiovascular;  Laterality: N/A;  . CARDIOVERSION N/A 07/07/2017   Procedure: CARDIOVERSION;  Surgeon: Sanda Klein, MD;  Location: [facility] ENDOSCOPY;  Service: Cardiovascular;  Laterality: N/A;  . TEE WITHOUT CARDIOVERSION N/A 06/13/2017   Procedure: TRANSESOPHAGEAL ECHOCARDIOGRAM (TEE);  Surgeon: Lelon Perla, MD;  Location: [facility] ENDOSCOPY;  Service: Cardiovascular;  Laterality: N/A;    Current Medications: No outpatient medications have been marked as taking for the 10/06/17 encounter (Appointment) with Richardo Priest, MD.     Allergies:   Patient has no known  allergies.   Social History   Socioeconomic History  . Marital status: Widowed    Spouse name: Not on file  . Number of children: Not on file  . Years of education: Not on file  . Highest education level: Not on file  Social Needs  . Financial resource strain: Not on file  . Food insecurity - worry: Not on file  . Food insecurity - inability: Not on file  . Transportation needs - medical: Not on file  . Transportation needs -  non-medical: Not on file  Occupational History  . Not on file  Tobacco Use  . Smoking status: Never Smoker  . Smokeless tobacco: Never Used  Substance and Sexual Activity  . Alcohol use: No    Frequency: Never  . Drug use: No  . Sexual activity: Not on file  Other Topics Concern  . Not on file  Social History Narrative  . Not on file     Family History: The patient's  family history includes AAA (abdominal aortic aneurysm) in her sister; Asthma in her father; Breast cancer in her sister; CAD in her mother; Cardiomyopathy in her mother; Diabetes in her brother and mother; Hypertension in her brother. ROS:   Please see the history of present illness.    All other systems reviewed and are negative.  EKGs/Labs/Other Studies Reviewed:    The following studies were reviewed today:    Recent Labs: 06/13/2017: ALT 31 06/14/2017: Magnesium 2.0 07/23/2017: Hemoglobin 14.7; Platelets 190 08/25/2017: BUN 17; Creatinine, Ser 0.78; NT-Pro BNP 172; Potassium 4.3; Sodium 140; TSH 4.370  Recent Lipid Panel No results found for: CHOL, TRIG, HDL, CHOLHDL, VLDL, LDLCALC, LDLDIRECT  Physical Exam:    VS:  There were no vitals taken for this visit.    Wt Readings from Last 3 Encounters:  08/25/17 176 lb 12.8 oz (80.2 kg)  07/28/17 174 lb (78.9 kg)  07/13/17 174 lb (78.9 kg)     GEN:  Well nourished, well developed in no acute distress HEENT: Normal NECK: No JVD; No carotid bruits LYMPHATICS: No lymphadenopathy CARDIAC: RRR, no murmurs, rubs, gallops RESPIRATORY:  Clear to auscultation without rales, wheezing or rhonchi  ABDOMEN: Soft, non-tender, non-distended MUSCULOSKELETAL:  No edema; No deformity  SKIN: Warm and dry NEUROLOGIC:  Alert and oriented x 3 PSYCHIATRIC:  Normal affect    Signed, Shirlee More, MD  10/06/2017 8:07 AM    [place]

## 2017-10-07 LAB — COMPREHENSIVE METABOLIC PANEL
ALBUMIN: 3.9 g/dL (ref 3.5–4.7)
ALK PHOS: 96 IU/L (ref 39–117)
ALT: 37 IU/L — ABNORMAL HIGH (ref 0–32)
AST: 29 IU/L (ref 0–40)
Albumin/Globulin Ratio: 1.3 (ref 1.2–2.2)
BILIRUBIN TOTAL: 0.3 mg/dL (ref 0.0–1.2)
BUN / CREAT RATIO: 16 (ref 12–28)
BUN: 15 mg/dL (ref 8–27)
CHLORIDE: 103 mmol/L (ref 96–106)
CO2: 26 mmol/L (ref 20–29)
CREATININE: 0.93 mg/dL (ref 0.57–1.00)
Calcium: 9.1 mg/dL (ref 8.7–10.3)
GFR calc Af Amer: 67 mL/min/{1.73_m2} (ref >59)
GFR calc non Af Amer: 58 mL/min/{1.73_m2} — ABNORMAL LOW (ref >59)
GLOBULIN, TOTAL: 3 g/dL (ref 1.5–4.5)
GLUCOSE: 97 mg/dL (ref 65–99)
Potassium: 4.6 mmol/L (ref 3.5–5.2)
SODIUM: 142 mmol/L (ref 134–144)
Total Protein: 6.9 g/dL (ref 6.0–8.5)

## 2017-10-07 LAB — TSH: TSH: 3.51 u[IU]/mL (ref 0.450–4.500)

## 2017-11-03 DIAGNOSIS — C4400 Unspecified malignant neoplasm of skin of lip: Secondary | ICD-10-CM | POA: Diagnosis not present

## 2017-11-03 DIAGNOSIS — B078 Other viral warts: Secondary | ICD-10-CM | POA: Diagnosis not present

## 2017-12-04 DIAGNOSIS — J441 Chronic obstructive pulmonary disease with (acute) exacerbation: Secondary | ICD-10-CM | POA: Diagnosis not present

## 2017-12-08 DIAGNOSIS — I10 Essential (primary) hypertension: Secondary | ICD-10-CM | POA: Diagnosis not present

## 2017-12-08 DIAGNOSIS — I5021 Acute systolic (congestive) heart failure: Secondary | ICD-10-CM | POA: Diagnosis not present

## 2017-12-08 DIAGNOSIS — I110 Hypertensive heart disease with heart failure: Secondary | ICD-10-CM | POA: Diagnosis not present

## 2017-12-08 DIAGNOSIS — J449 Chronic obstructive pulmonary disease, unspecified: Secondary | ICD-10-CM | POA: Diagnosis not present

## 2017-12-08 DIAGNOSIS — E782 Mixed hyperlipidemia: Secondary | ICD-10-CM | POA: Diagnosis not present

## 2017-12-08 DIAGNOSIS — I480 Paroxysmal atrial fibrillation: Secondary | ICD-10-CM | POA: Diagnosis not present

## 2018-01-04 DIAGNOSIS — Z1231 Encounter for screening mammogram for malignant neoplasm of breast: Secondary | ICD-10-CM | POA: Diagnosis not present

## 2018-01-18 DIAGNOSIS — N6311 Unspecified lump in the right breast, upper outer quadrant: Secondary | ICD-10-CM | POA: Diagnosis not present

## 2018-01-18 DIAGNOSIS — R928 Other abnormal and inconclusive findings on diagnostic imaging of breast: Secondary | ICD-10-CM | POA: Diagnosis not present

## 2018-02-05 DIAGNOSIS — L550 Sunburn of first degree: Secondary | ICD-10-CM | POA: Diagnosis not present

## 2018-03-02 ENCOUNTER — Encounter: Payer: Self-pay | Admitting: Cardiology

## 2018-03-02 ENCOUNTER — Ambulatory Visit (INDEPENDENT_AMBULATORY_CARE_PROVIDER_SITE_OTHER): Payer: PPO | Admitting: Cardiology

## 2018-03-02 VITALS — BP 126/68 | HR 67 | Ht 65.0 in | Wt 185.0 lb

## 2018-03-02 DIAGNOSIS — I480 Paroxysmal atrial fibrillation: Secondary | ICD-10-CM

## 2018-03-02 DIAGNOSIS — I110 Hypertensive heart disease with heart failure: Secondary | ICD-10-CM

## 2018-03-02 DIAGNOSIS — I5042 Chronic combined systolic (congestive) and diastolic (congestive) heart failure: Secondary | ICD-10-CM | POA: Diagnosis not present

## 2018-03-02 DIAGNOSIS — Z79899 Other long term (current) drug therapy: Secondary | ICD-10-CM | POA: Diagnosis not present

## 2018-03-02 NOTE — Patient Instructions (Signed)
Medication Instructions:  Your physician recommends that you continue on your current medications as directed. Please refer to the Current Medication list given to you today.  Labwork: Your physician recommends that you have the following labs drawn: CMP and TSH  Testing/Procedures: None  Follow-Up: Your physician recommends that you schedule a follow-up appointment in: 6 months  Any Other Special Instructions Will Be Listed Below (If Applicable).     If you need a refill on your cardiac medications before your next appointment, please call your pharmacy.   [place], RN, BSN

## 2018-03-02 NOTE — Progress Notes (Signed)
Cardiology Office Note:    Date:  03/02/2018   ID:  Stephanie Douglas, DOB 02-11-1934, MRN [id number]  PCP:  Lillard Anes, MD  Cardiologist:  Shirlee More, MD    Referring MD: Lillard Anes,*    ASSESSMENT:    1. Chronic combined systolic and diastolic heart failure (HCC)   2. Paroxysmal atrial fibrillation ([place])   3. Hypertensive heart disease with heart failure ([place])   4. On amiodarone therapy    PLAN:    In order of problems listed above:  1. Stable compensated she has no fluid overload we will continue her current diuretic and home self management. 2. Stable maintain sinus rhythm continue low-dose amiodarone current anticoagulant.  To screen for toxicity check liver function TSH 3. Stable continue current antihypertensive 4. Continue amiodarone check liver function TSH regarding toxicity   Next appointment: 6 months   Medication Adjustments/Labs and Tests Ordered: Current medicines are reviewed at length with the patient today.  Concerns regarding medicines are outlined above.  Orders Placed This Encounter  Procedures  . Comp Met (CMET)  . TSH  . EKG 12-Lead  . EKG 12-Lead   No orders of the defined types were placed in this encounter.   Chief Complaint  Patient presents with  . Congestive Heart Failure  . Atrial Fibrillation    History of Present Illness:    Stephanie Douglas is a 84 y.o. female with a hx of paroxysmal atrial fibrillation hypertensive heart disease with heart failure chronic anticoagulation on amiodarone therapy.  He has been cardioverted and was last seen 10/06/2017. Compliance with diet, lifestyle and medications: Yes She weighs at home her weights have been in range she said no edema chest pain orthopnea palpitation or syncope is mildly short of breath when she walks a longer distance does garden work or walks uphill, New York Heart Association class II.  She takes amiodarone without side effect and rate continues her anticoagulant  without bleeding complication. Past Medical History:  Diagnosis Date  . COPD (chronic obstructive pulmonary disease) ([place])   . Depression   . HTN (hypertension)   . PONV (postoperative nausea and vomiting)     Past Surgical History:  Procedure Laterality Date  . ABDOMINAL HYSTERECTOMY    . CARDIOVERSION N/A 06/13/2017   Procedure: CARDIOVERSION;  Surgeon: Lelon Perla, MD;  Location: [facility] ENDOSCOPY;  Service: Cardiovascular;  Laterality: N/A;  . CARDIOVERSION N/A 07/07/2017   Procedure: CARDIOVERSION;  Surgeon: Sanda Klein, MD;  Location: [facility] ENDOSCOPY;  Service: Cardiovascular;  Laterality: N/A;  . TEE WITHOUT CARDIOVERSION N/A 06/13/2017   Procedure: TRANSESOPHAGEAL ECHOCARDIOGRAM (TEE);  Surgeon: Lelon Perla, MD;  Location: [facility] ENDOSCOPY;  Service: Cardiovascular;  Laterality: N/A;    Current Medications: Current Meds  Medication Sig  . amiodarone (PACERONE) 400 MG tablet Take 0.5 tablets (200 mg total) by mouth daily.  Marland Kitchen apixaban (ELIQUIS) 5 MG TABS tablet Take 1 tablet (5 mg total) by mouth 2 (two) times daily.  Marland Kitchen atorvastatin (LIPITOR) 20 MG tablet Take 1 tablet (20 mg total) by mouth daily.  Marland Kitchen CALCIUM ASCORBATE PO Take daily by mouth.  . carvedilol (COREG) 3.125 MG tablet Take 1 tablet (3.125 mg total) by mouth 2 (two) times daily with a meal.  . Fluticasone-Umeclidin-Vilant (TRELEGY ELLIPTA) 100-62.5-25 MCG/INH AEPB Inhale into the lungs daily as needed.   . furosemide (LASIX) 20 MG tablet Take 1 tablet (20 mg total) by mouth 3 (three) times a week. Take any day you weigh  173 lbs or less  . loratadine (CLARITIN) 10 MG tablet Take 10 mg daily as needed by mouth for allergies.  . polyethylene glycol (MIRALAX / GLYCOLAX) packet Take 17 g daily as needed by mouth.     Allergies:   Patient has no known allergies.   Social History   Socioeconomic History  . Marital status: Widowed    Spouse name: Not on file  . Number of children: Not on file  . Years of  education: Not on file  . Highest education level: Not on file  Occupational History  . Not on file  Social Needs  . Financial resource strain: Not on file  . Food insecurity:    Worry: Not on file    Inability: Not on file  . Transportation needs:    Medical: Not on file    Non-medical: Not on file  Tobacco Use  . Smoking status: Never Smoker  . Smokeless tobacco: Never Used  Substance and Sexual Activity  . Alcohol use: No    Frequency: Never  . Drug use: No  . Sexual activity: Not on file  Lifestyle  . Physical activity:    Days per week: Not on file    Minutes per session: Not on file  . Stress: Not on file  Relationships  . Social connections:    Talks on phone: Not on file    Gets together: Not on file    Attends religious service: Not on file    Active member of club or organization: Not on file    Attends meetings of clubs or organizations: Not on file    Relationship status: Not on file  Other Topics Concern  . Not on file  Social History Narrative  . Not on file     Family History: The patient's family history includes AAA (abdominal aortic aneurysm) in her sister; Asthma in her father; Breast cancer in her sister; CAD in her mother; Cardiomyopathy in her mother; Diabetes in her brother and mother; Hypertension in her brother. ROS:   Please see the history of present illness.    All other systems reviewed and are negative.  EKGs/Labs/Other Studies Reviewed:    The following studies were reviewed today:  EKG:  EKG ordered today.  The ekg ordered today demonstrates sinus rhythm normal  Recent Labs: 06/14/2017: Magnesium 2.0 07/23/2017: Hemoglobin 14.7; Platelets 190 08/25/2017: NT-Pro BNP 172 10/06/2017: ALT 37; BUN 15; Creatinine, Ser 0.93; Potassium 4.6; Sodium 142; TSH 3.510  Recent Lipid Panel No results found for: CHOL, TRIG, HDL, CHOLHDL, VLDL, LDLCALC, LDLDIRECT  Physical Exam:    VS:  BP 126/68 (BP Location: Right Arm, Patient Position:  Sitting, Cuff Size: Normal)   Pulse 67   Ht '5\' 5"'$  (1.651 m)   Wt 185 lb (83.9 kg)   SpO2 96%   BMI 30.79 kg/m     Wt Readings from Last 3 Encounters:  03/02/18 185 lb (83.9 kg)  10/06/17 179 lb 12.8 oz (81.6 kg)  08/25/17 176 lb 12.8 oz (80.2 kg)     GEN:  Well nourished, well developed in no acute distress HEENT: Normal NECK: No JVD; No carotid bruits LYMPHATICS: No lymphadenopathy CARDIAC: Irr Irr variable s1 RESPIRATORY:  Clear to auscultation without rales, wheezing or rhonchi  ABDOMEN: Soft, non-tender, non-distended MUSCULOSKELETAL:  No edema; No deformity  SKIN: Warm and dry NEUROLOGIC:  Alert and oriented x 3 PSYCHIATRIC:  Normal affect    Signed, Shirlee More, MD  03/02/2018 3:37 PM  [facility]

## 2018-03-03 LAB — COMPREHENSIVE METABOLIC PANEL
ALK PHOS: 100 IU/L (ref 39–117)
ALT: 28 IU/L (ref 0–32)
AST: 26 IU/L (ref 0–40)
Albumin/Globulin Ratio: 1.2 (ref 1.2–2.2)
Albumin: 3.9 g/dL (ref 3.5–4.7)
BUN/Creatinine Ratio: 20 (ref 12–28)
BUN: 21 mg/dL (ref 8–27)
Bilirubin Total: 0.4 mg/dL (ref 0.0–1.2)
CO2: 28 mmol/L (ref 20–29)
CREATININE: 1.03 mg/dL — AB (ref 0.57–1.00)
Calcium: 9.4 mg/dL (ref 8.7–10.3)
Chloride: 100 mmol/L (ref 96–106)
GFR calc Af Amer: 58 mL/min/{1.73_m2} — ABNORMAL LOW (ref >59)
GFR calc non Af Amer: 51 mL/min/{1.73_m2} — ABNORMAL LOW (ref >59)
GLUCOSE: 93 mg/dL (ref 65–99)
Globulin, Total: 3.2 g/dL (ref 1.5–4.5)
POTASSIUM: 4.3 mmol/L (ref 3.5–5.2)
Sodium: 140 mmol/L (ref 134–144)
Total Protein: 7.1 g/dL (ref 6.0–8.5)

## 2018-03-03 LAB — TSH: TSH: 2.71 u[IU]/mL (ref 0.450–4.500)

## 2018-03-05 ENCOUNTER — Telehealth: Payer: Self-pay

## 2018-03-05 NOTE — Telephone Encounter (Signed)
Called and left detailed voice message on patients phone.

## 2018-03-05 NOTE — Telephone Encounter (Signed)
-----   Message from [place] sent at 03/05/2018 10:08 AM EDT -----   ----- Message ----- From: Richardo Priest, MD Sent: 03/03/2018  12:41 PM To: Stevan Born, CMA  Normal or stable result

## 2018-04-12 DIAGNOSIS — E782 Mixed hyperlipidemia: Secondary | ICD-10-CM | POA: Diagnosis not present

## 2018-04-12 DIAGNOSIS — I110 Hypertensive heart disease with heart failure: Secondary | ICD-10-CM | POA: Diagnosis not present

## 2018-04-12 DIAGNOSIS — I5021 Acute systolic (congestive) heart failure: Secondary | ICD-10-CM | POA: Diagnosis not present

## 2018-04-12 DIAGNOSIS — I480 Paroxysmal atrial fibrillation: Secondary | ICD-10-CM | POA: Diagnosis not present

## 2018-04-12 DIAGNOSIS — I10 Essential (primary) hypertension: Secondary | ICD-10-CM | POA: Diagnosis not present

## 2018-04-12 DIAGNOSIS — Z6831 Body mass index (BMI) 31.0-31.9, adult: Secondary | ICD-10-CM | POA: Diagnosis not present

## 2018-04-12 DIAGNOSIS — J449 Chronic obstructive pulmonary disease, unspecified: Secondary | ICD-10-CM | POA: Diagnosis not present

## 2018-05-08 DIAGNOSIS — Z23 Encounter for immunization: Secondary | ICD-10-CM | POA: Diagnosis not present

## 2018-06-05 DIAGNOSIS — Z961 Presence of intraocular lens: Secondary | ICD-10-CM | POA: Diagnosis not present

## 2018-06-05 DIAGNOSIS — H43813 Vitreous degeneration, bilateral: Secondary | ICD-10-CM | POA: Diagnosis not present

## 2018-06-05 DIAGNOSIS — H04123 Dry eye syndrome of bilateral lacrimal glands: Secondary | ICD-10-CM | POA: Diagnosis not present

## 2018-07-02 ENCOUNTER — Telehealth: Payer: Self-pay

## 2018-07-02 MED ORDER — APIXABAN 5 MG PO TABS: 5.0000 mg | ORAL_TABLET | Freq: Two times a day (BID) | ORAL | 1 refills | Status: DC

## 2018-07-02 MED ORDER — CARVEDILOL 3.125 MG PO TABS: 3.1250 mg | ORAL_TABLET | Freq: Two times a day (BID) | ORAL | 1 refills | Status: DC

## 2018-07-02 NOTE — Telephone Encounter (Signed)
Rx sent to pharmacy as requested.

## 2018-08-02 ENCOUNTER — Other Ambulatory Visit: Payer: Self-pay | Admitting: Emergency Medicine

## 2018-08-02 DIAGNOSIS — Z79899 Other long term (current) drug therapy: Secondary | ICD-10-CM

## 2018-08-02 MED ORDER — AMIODARONE HCL 400 MG PO TABS: 200.0000 mg | ORAL_TABLET | Freq: Two times a day (BID) | ORAL | 3 refills | Status: DC

## 2018-08-07 ENCOUNTER — Telehealth: Payer: Self-pay

## 2018-08-07 ENCOUNTER — Telehealth: Payer: Self-pay | Admitting: *Deleted

## 2018-08-07 DIAGNOSIS — Z79899 Other long term (current) drug therapy: Secondary | ICD-10-CM

## 2018-08-07 MED ORDER — AMIODARONE HCL 400 MG PO TABS: 200.0000 mg | ORAL_TABLET | Freq: Two times a day (BID) | ORAL | 1 refills | Status: DC

## 2018-08-07 MED ORDER — AMIODARONE HCL 200 MG PO TABS: 200.0000 mg | ORAL_TABLET | Freq: Every day | ORAL | Status: DC

## 2018-08-07 NOTE — Telephone Encounter (Signed)
Verified that patient should be taking amiodarone 200 mg daily per Dr. Bettina Gavia. Stephanie Douglas states that she will inform patient.

## 2018-08-07 NOTE — Telephone Encounter (Signed)
Rx sent to pharmacy as requested.

## 2018-08-23 DIAGNOSIS — R928 Other abnormal and inconclusive findings on diagnostic imaging of breast: Secondary | ICD-10-CM | POA: Diagnosis not present

## 2018-09-02 NOTE — Progress Notes (Signed)
Cardiology Office Note:    Date:  09/03/2018   ID:  ADEENA BERNABE, DOB May 16, 1934, MRN [id number]  PCP:  Lillard Anes, MD Please do a CMP lipid and TSH at her visit and follow every 6 months Cardiologist:  Shirlee More, MD    Referring MD: Lillard Anes,*    ASSESSMENT:    1. Paroxysmal atrial fibrillation (HCC)   2. Chronic anticoagulation   3. Hypertensive heart disease with heart failure ([place])   4. Chronic combined systolic and diastolic heart failure (HCC)    PLAN:    In order of problems listed above:  1. Stable maintaining sinus rhythm continue low-dose amiodarone she is going be seeing her PCP in the next few weeks I will ask him to follow her with a CMP and TSH every 6 months and I will see her back in my office in 1 year or sooner if she is having clinical recurrence she will remain anticoagulated 2. Continue her anticoagulant with moderate stroke risk 3. Stable no evidence of heart failure continue her low-dose diuretics follow labs renal function and potassium with her PCP and beta-blocker 4. Stable compensated continue current treatment 5. Stable dyslipidemia continue her statin labs at her PCP office in the next few weeks   Next appointment: 1 year   Medication Adjustments/Labs and Tests Ordered: Current medicines are reviewed at length with the patient today.  Concerns regarding medicines are outlined above.  No orders of the defined types were placed in this encounter.  No orders of the defined types were placed in this encounter.   Chief Complaint  Patient presents with  . Follow-up  . Atrial Fibrillation  . Congestive Heart Failure  . Hypertension    History of Present Illness:    YVETT ROSSEL is a 85 y.o. female with a hx of paroxysmal atrial fibrillation hypertensive heart disease with heart failure chronic anticoagulation on amiodarone therapy  last seen 03/02/18.  At that time liver function and TSH were normal screening for  amiodarone toxicity Compliance with diet, lifestyle and medications: Yes  She is pleased with the quality of her life exercises 3 days a week and has had no recurrent palpitation edema shortness of breath chest pain syncope TIA or bleeding complication of her anticoagulant.  She did have photosensitivity and sunburn at the beach last year and I reinforced using sunscreen with amiodarone and to keep her arms and legs covered and a half. Past Medical History:  Diagnosis Date  . COPD (chronic obstructive pulmonary disease) ([place])   . Depression   . HTN (hypertension)   . Hyperlipidemia   . PONV (postoperative nausea and vomiting)     Past Surgical History:  Procedure Laterality Date  . ABDOMINAL HYSTERECTOMY    . CARDIOVERSION N/A 06/13/2017   Procedure: CARDIOVERSION;  Surgeon: Lelon Perla, MD;  [facility] ENDOSCOPY;  Service: Cardiovascular;  Laterality: N/A;  . CARDIOVERSION N/A 07/07/2017   Procedure: CARDIOVERSION;  Surgeon: Sanda Klein, MD;  Location: [facility] ENDOSCOPY;  Service: Cardiovascular;  Laterality: N/A;  . TEE WITHOUT CARDIOVERSION N/A 06/13/2017   Procedure: TRANSESOPHAGEAL ECHOCARDIOGRAM (TEE);  Surgeon: Lelon Perla, MD;  Location: [facility] ENDOSCOPY;  Service: Cardiovascular;  Laterality: N/A;    Current Medications: Current Meds  Medication Sig  . amiodarone (PACERONE) 200 MG tablet Take 1 tablet (200 mg total) by mouth daily.  Marland Kitchen apixaban (ELIQUIS) 5 MG TABS tablet Take 1 tablet (5 mg total) by mouth 2 (two) times daily.  Marland Kitchen  atorvastatin (LIPITOR) 20 MG tablet Take 1 tablet (20 mg total) by mouth daily.  Marland Kitchen CALCIUM ASCORBATE PO Take daily by mouth.  . carvedilol (COREG) 3.125 MG tablet Take 1 tablet (3.125 mg total) by mouth 2 (two) times daily with a meal.  . Fluticasone-Umeclidin-Vilant (TRELEGY ELLIPTA) 100-62.5-25 MCG/INH AEPB Inhale into the lungs daily as needed.   . furosemide (LASIX) 20 MG tablet Take 1 tablet (20 mg total) by mouth 3 (three) times a week.  Take any day you weigh 173 lbs or less  . loratadine (CLARITIN) 10 MG tablet Take 10 mg daily as needed by mouth for allergies.  . polyethylene glycol (MIRALAX / GLYCOLAX) packet Take 17 g daily as needed by mouth.     Allergies:   Patient has no known allergies.   Social History   Socioeconomic History  . Marital status: Widowed    Spouse name: Not on file  . Number of children: Not on file  . Years of education: Not on file  . Highest education level: Not on file  Occupational History  . Not on file  Social Needs  . Financial resource strain: Not on file  . Food insecurity:    Worry: Not on file    Inability: Not on file  . Transportation needs:    Medical: Not on file    Non-medical: Not on file  Tobacco Use  . Smoking status: Never Smoker  . Smokeless tobacco: Never Used  Substance and Sexual Activity  . Alcohol use: No    Frequency: Never  . Drug use: No  . Sexual activity: Not on file  Lifestyle  . Physical activity:    Days per week: Not on file    Minutes per session: Not on file  . Stress: Not on file  Relationships  . Social connections:    Talks on phone: Not on file    Gets together: Not on file    Attends religious service: Not on file    Active member of club or organization: Not on file    Attends meetings of clubs or organizations: Not on file    Relationship status: Not on file  Other Topics Concern  . Not on file  Social History Narrative  . Not on file     Family History: The patient's family history includes AAA (abdominal aortic aneurysm) in her sister; Asthma in her father; Breast cancer in her sister; CAD in her mother; Cardiomyopathy in her mother; Diabetes in her brother and mother; Hypertension in her brother. ROS:   Please see the history of present illness.    All other systems reviewed and are negative.  EKGs/Labs/Other Studies Reviewed:    The following studies were reviewed today:  EKG:  EKG ordered today.  The ekg ordered  today demonstrates sinus rhythm left axis deviation nonspecific T waves  Recent Labs: 03/02/2018: ALT 28; BUN 21; Creatinine, Ser 1.03; Potassium 4.3; Sodium 140; TSH 2.710  Recent Lipid Panel No results found for: CHOL, TRIG, HDL, CHOLHDL, VLDL, LDLCALC, LDLDIRECT  Physical Exam:    VS:  BP 130/74 (BP Location: Left Arm, Patient Position: Sitting, Cuff Size: Normal)   Pulse 62   Ht 5\' 5"  (1.651 m)   Wt 188 lb 8 oz (85.5 kg)   SpO2 95%   BMI 31.37 kg/m     Wt Readings from Last 3 Encounters:  09/03/18 188 lb 8 oz (85.5 kg)  03/02/18 185 lb (83.9 kg)  10/06/17 179 lb 12.8 oz (  81.6 kg)     GEN:  Well nourished, well developed in no acute distress HEENT: Normal NECK: No JVD; No carotid bruits LYMPHATICS: No lymphadenopathy CARDIAC: RRR, no murmurs, rubs, gallops RESPIRATORY:  Clear to auscultation without rales, wheezing or rhonchi  ABDOMEN: Soft, non-tender, non-distended MUSCULOSKELETAL:  No edema; No deformity  SKIN: Warm and dry NEUROLOGIC:  Alert and oriented x 3 PSYCHIATRIC:  Normal affect    Signed, Shirlee More, MD  09/03/2018 11:07 AM    [place]

## 2018-09-03 ENCOUNTER — Encounter: Payer: Self-pay | Admitting: Cardiology

## 2018-09-03 ENCOUNTER — Ambulatory Visit: Payer: PPO | Admitting: Cardiology

## 2018-09-03 VITALS — BP 130/74 | HR 62 | Ht 65.0 in | Wt 188.5 lb

## 2018-09-03 DIAGNOSIS — I480 Paroxysmal atrial fibrillation: Secondary | ICD-10-CM | POA: Diagnosis not present

## 2018-09-03 DIAGNOSIS — I5042 Chronic combined systolic (congestive) and diastolic (congestive) heart failure: Secondary | ICD-10-CM

## 2018-09-03 DIAGNOSIS — Z7901 Long term (current) use of anticoagulants: Secondary | ICD-10-CM | POA: Diagnosis not present

## 2018-09-03 DIAGNOSIS — I110 Hypertensive heart disease with heart failure: Secondary | ICD-10-CM

## 2018-09-03 MED ORDER — MULTI VITAMIN PO TABS: 1.0000 | ORAL_TABLET | Freq: Every day | ORAL | Status: DC

## 2018-09-03 NOTE — Patient Instructions (Signed)
Medication Instructions:  Your physician has recommended you make the following change in your medication:   START:  Multi vitamin ( over-the-counter) one tablet daily.   If you need a refill on your cardiac medications before your next appointment, please call your pharmacy.   Lab work: NONE If you have labs (blood work) drawn today and your tests are completely normal, you will receive your results only by: Marland Kitchen MyChart Message (if you have MyChart) OR . A paper copy in the mail If you have any lab test that is abnormal or we need to change your treatment, we will call you to review the results.  Testing/Procedures: You had an EKG today  Follow-Up: At [facility], you and your health needs are our priority.  As part of our continuing mission to provide you with exceptional heart care, we have created designated Provider Care Teams.  These Care Teams include your primary Cardiologist (physician) and Advanced Practice Providers (APPs -  Physician Assistants and Nurse Practitioners) who all work together to provide you with the care you need, when you need it. You will need a follow up appointment in 1 years.  Please call our office 2 months in advance to schedule this appointment.

## 2018-09-10 DIAGNOSIS — J441 Chronic obstructive pulmonary disease with (acute) exacerbation: Secondary | ICD-10-CM | POA: Diagnosis not present

## 2018-09-25 DIAGNOSIS — I10 Essential (primary) hypertension: Secondary | ICD-10-CM | POA: Diagnosis not present

## 2018-09-25 DIAGNOSIS — M19049 Primary osteoarthritis, unspecified hand: Secondary | ICD-10-CM | POA: Diagnosis not present

## 2018-09-25 DIAGNOSIS — J449 Chronic obstructive pulmonary disease, unspecified: Secondary | ICD-10-CM | POA: Diagnosis not present

## 2018-09-25 DIAGNOSIS — I480 Paroxysmal atrial fibrillation: Secondary | ICD-10-CM | POA: Diagnosis not present

## 2018-09-25 DIAGNOSIS — I5042 Chronic combined systolic (congestive) and diastolic (congestive) heart failure: Secondary | ICD-10-CM | POA: Diagnosis not present

## 2018-09-25 DIAGNOSIS — E782 Mixed hyperlipidemia: Secondary | ICD-10-CM | POA: Diagnosis not present

## 2018-09-25 DIAGNOSIS — I110 Hypertensive heart disease with heart failure: Secondary | ICD-10-CM | POA: Diagnosis not present

## 2018-09-25 DIAGNOSIS — Z6831 Body mass index (BMI) 31.0-31.9, adult: Secondary | ICD-10-CM | POA: Diagnosis not present

## 2018-09-25 DIAGNOSIS — Z7901 Long term (current) use of anticoagulants: Secondary | ICD-10-CM | POA: Diagnosis not present

## 2018-10-15 ENCOUNTER — Other Ambulatory Visit: Payer: Self-pay

## 2018-10-15 DIAGNOSIS — Z79899 Other long term (current) drug therapy: Secondary | ICD-10-CM

## 2018-10-15 MED ORDER — AMIODARONE HCL 200 MG PO TABS: 200.0000 mg | ORAL_TABLET | Freq: Every day | ORAL | 11 refills | Status: DC

## 2018-10-19 ENCOUNTER — Other Ambulatory Visit: Payer: Self-pay

## 2018-10-19 ENCOUNTER — Other Ambulatory Visit: Payer: Self-pay | Admitting: *Deleted

## 2018-10-19 DIAGNOSIS — Z79899 Other long term (current) drug therapy: Secondary | ICD-10-CM

## 2018-10-19 MED ORDER — AMIODARONE HCL 200 MG PO TABS: 200.0000 mg | ORAL_TABLET | Freq: Every day | ORAL | 1 refills | Status: DC

## 2018-11-22 ENCOUNTER — Other Ambulatory Visit: Payer: Self-pay | Admitting: Cardiology

## 2018-11-22 ENCOUNTER — Telehealth: Payer: Self-pay | Admitting: Cardiology

## 2018-11-22 DIAGNOSIS — I4819 Other persistent atrial fibrillation: Secondary | ICD-10-CM

## 2018-11-22 MED ORDER — FUROSEMIDE 20 MG PO TABS: 20.0000 mg | ORAL_TABLET | ORAL | 3 refills | Status: DC

## 2018-11-22 NOTE — Telephone Encounter (Signed)
Rx refill sent to pharmacy. 

## 2018-11-22 NOTE — Telephone Encounter (Signed)
°*  STAT* If patient is at the pharmacy, call can be transferred to refill team.   1. Which medications need to be refilled? (please list name of each medication and dose if known) furosemide (LASIX) 20 MG tablet   2. Which pharmacy/location (including street and city if local pharmacy) is medication to be sent to?  [place], [place] - [address] [phone] (Phone) [phone] (Fax)    3. Do they need a 30 day or 90 day supply? [place]

## 2018-11-28 NOTE — Telephone Encounter (Signed)
complete

## 2019-01-08 ENCOUNTER — Other Ambulatory Visit: Payer: Self-pay | Admitting: Cardiology

## 2019-01-08 MED ORDER — APIXABAN 5 MG PO TABS: 5.0000 mg | ORAL_TABLET | Freq: Two times a day (BID) | ORAL | 1 refills | Status: DC

## 2019-01-08 MED ORDER — CARVEDILOL 3.125 MG PO TABS: 3.1250 mg | ORAL_TABLET | Freq: Two times a day (BID) | ORAL | 1 refills | Status: DC

## 2019-01-08 NOTE — Telephone Encounter (Signed)
Coreg and Eliquis refills sent to [place] per pt request.

## 2019-01-08 NOTE — Telephone Encounter (Signed)
°*  STAT* If patient is at the pharmacy, call can be transferred to refill team.   1. Which medications need to be refilled? (please list name of each medication and dose if known) carvedilol (COREG) 3.125 MG tablet    2. Which pharmacy/location (including street and city if local pharmacy) is medication to be sent to?  [place], [place] - [address] [phone] (Phone) [phone] (Fax)    3. Do they need a 30 day or 90 day supply? 90 day

## 2019-01-08 NOTE — Telephone Encounter (Signed)
°*  STAT* If patient is at the pharmacy, call can be transferred to refill team.   1. Which medications need to be refilled? (please list name of each medication and dose if known) apixaban (ELIQUIS) 5 MG TABS tablet   2. Which pharmacy/location (including street and city if local pharmacy) is medication to be sent to?  [place], [place] - [address] [phone] (Phone) [phone] (Fax)    3. Do they need a 30 day or 90 day supply? 90 day

## 2019-01-14 ENCOUNTER — Other Ambulatory Visit: Payer: Self-pay

## 2019-01-14 MED ORDER — CARVEDILOL 3.125 MG PO TABS: 3.1250 mg | ORAL_TABLET | Freq: Two times a day (BID) | ORAL | 2 refills | Status: DC

## 2019-01-14 MED ORDER — APIXABAN 5 MG PO TABS: 5.0000 mg | ORAL_TABLET | Freq: Two times a day (BID) | ORAL | 2 refills | Status: DC

## 2019-01-14 NOTE — Telephone Encounter (Signed)
Refill for eliquis sent to [place]

## 2019-01-24 DIAGNOSIS — Z6831 Body mass index (BMI) 31.0-31.9, adult: Secondary | ICD-10-CM | POA: Diagnosis not present

## 2019-01-24 DIAGNOSIS — I10 Essential (primary) hypertension: Secondary | ICD-10-CM | POA: Diagnosis not present

## 2019-01-24 DIAGNOSIS — I110 Hypertensive heart disease with heart failure: Secondary | ICD-10-CM | POA: Diagnosis not present

## 2019-01-24 DIAGNOSIS — E782 Mixed hyperlipidemia: Secondary | ICD-10-CM | POA: Diagnosis not present

## 2019-01-24 DIAGNOSIS — I480 Paroxysmal atrial fibrillation: Secondary | ICD-10-CM | POA: Diagnosis not present

## 2019-01-24 DIAGNOSIS — Z1159 Encounter for screening for other viral diseases: Secondary | ICD-10-CM | POA: Diagnosis not present

## 2019-01-24 DIAGNOSIS — J449 Chronic obstructive pulmonary disease, unspecified: Secondary | ICD-10-CM | POA: Diagnosis not present

## 2019-02-05 DIAGNOSIS — Z6829 Body mass index (BMI) 29.0-29.9, adult: Secondary | ICD-10-CM | POA: Diagnosis not present

## 2019-02-05 DIAGNOSIS — Z0000 Encounter for general adult medical examination without abnormal findings: Secondary | ICD-10-CM | POA: Diagnosis not present

## 2019-02-05 DIAGNOSIS — Z1159 Encounter for screening for other viral diseases: Secondary | ICD-10-CM | POA: Diagnosis not present

## 2019-04-11 ENCOUNTER — Telehealth: Payer: Self-pay | Admitting: Cardiology

## 2019-04-11 NOTE — Telephone Encounter (Signed)
Wants some help with Eliquis

## 2019-04-12 NOTE — Telephone Encounter (Signed)
Spoke with patient's son, [name], per DPR to inform him that eliquis 5 mg samples are available for pick up in the [place] office. Educated him on patient assistance for eliquis and advised that the paperwork would be with the samples. Informed him to complete the patient portion of the paperwork and provide proof of income. Once completed, bring back to the [place] office for [place] to complete the provider portion and fax to [facility] for processing. [name] is agreeable and verbalized understanding. No further questions.

## 2019-04-16 ENCOUNTER — Other Ambulatory Visit: Payer: Self-pay | Admitting: Cardiology

## 2019-04-16 DIAGNOSIS — Z79899 Other long term (current) drug therapy: Secondary | ICD-10-CM

## 2019-04-30 DIAGNOSIS — M8589 Other specified disorders of bone density and structure, multiple sites: Secondary | ICD-10-CM | POA: Diagnosis not present

## 2019-04-30 DIAGNOSIS — M810 Age-related osteoporosis without current pathological fracture: Secondary | ICD-10-CM | POA: Diagnosis not present

## 2019-04-30 DIAGNOSIS — N959 Unspecified menopausal and perimenopausal disorder: Secondary | ICD-10-CM | POA: Diagnosis not present

## 2019-05-27 DIAGNOSIS — Z7901 Long term (current) use of anticoagulants: Secondary | ICD-10-CM | POA: Diagnosis not present

## 2019-05-27 DIAGNOSIS — E782 Mixed hyperlipidemia: Secondary | ICD-10-CM | POA: Diagnosis not present

## 2019-05-27 DIAGNOSIS — I10 Essential (primary) hypertension: Secondary | ICD-10-CM | POA: Diagnosis not present

## 2019-05-27 DIAGNOSIS — J449 Chronic obstructive pulmonary disease, unspecified: Secondary | ICD-10-CM | POA: Diagnosis not present

## 2019-05-27 DIAGNOSIS — Z1159 Encounter for screening for other viral diseases: Secondary | ICD-10-CM | POA: Diagnosis not present

## 2019-05-27 DIAGNOSIS — I5021 Acute systolic (congestive) heart failure: Secondary | ICD-10-CM | POA: Diagnosis not present

## 2019-05-27 DIAGNOSIS — I480 Paroxysmal atrial fibrillation: Secondary | ICD-10-CM | POA: Diagnosis not present

## 2019-05-27 DIAGNOSIS — Z6831 Body mass index (BMI) 31.0-31.9, adult: Secondary | ICD-10-CM | POA: Diagnosis not present

## 2019-06-11 DIAGNOSIS — D225 Melanocytic nevi of trunk: Secondary | ICD-10-CM | POA: Diagnosis not present

## 2019-06-11 DIAGNOSIS — L728 Other follicular cysts of the skin and subcutaneous tissue: Secondary | ICD-10-CM | POA: Diagnosis not present

## 2019-06-11 DIAGNOSIS — D2239 Melanocytic nevi of other parts of face: Secondary | ICD-10-CM | POA: Diagnosis not present

## 2019-06-11 DIAGNOSIS — L821 Other seborrheic keratosis: Secondary | ICD-10-CM | POA: Diagnosis not present

## 2019-07-05 DIAGNOSIS — H04123 Dry eye syndrome of bilateral lacrimal glands: Secondary | ICD-10-CM | POA: Diagnosis not present

## 2019-07-05 DIAGNOSIS — Z961 Presence of intraocular lens: Secondary | ICD-10-CM | POA: Diagnosis not present

## 2019-07-05 DIAGNOSIS — H524 Presbyopia: Secondary | ICD-10-CM | POA: Diagnosis not present

## 2019-07-10 DIAGNOSIS — Z1231 Encounter for screening mammogram for malignant neoplasm of breast: Secondary | ICD-10-CM | POA: Diagnosis not present

## 2019-09-25 ENCOUNTER — Other Ambulatory Visit: Payer: Self-pay

## 2019-09-25 MED ORDER — TRELEGY ELLIPTA 100-62.5-25 MCG/INH IN AEPB: 1.0000 | INHALATION_SPRAY | Freq: Every day | RESPIRATORY_TRACT | 6 refills | Status: DC | PRN

## 2019-09-26 ENCOUNTER — Other Ambulatory Visit: Payer: Self-pay | Admitting: Legal Medicine

## 2019-09-26 DIAGNOSIS — J449 Chronic obstructive pulmonary disease, unspecified: Secondary | ICD-10-CM

## 2019-09-26 MED ORDER — TRELEGY ELLIPTA 100-62.5-25 MCG/INH IN AEPB: 1.0000 | INHALATION_SPRAY | Freq: Every day | RESPIRATORY_TRACT | 6 refills | Status: DC | PRN

## 2019-09-27 ENCOUNTER — Ambulatory Visit (INDEPENDENT_AMBULATORY_CARE_PROVIDER_SITE_OTHER): Payer: PPO | Admitting: Cardiology

## 2019-09-27 ENCOUNTER — Encounter: Payer: Self-pay | Admitting: Cardiology

## 2019-09-27 ENCOUNTER — Other Ambulatory Visit: Payer: Self-pay

## 2019-09-27 VITALS — BP 130/78 | HR 60 | Temp 97.7°F | Ht 65.0 in | Wt 183.0 lb

## 2019-09-27 DIAGNOSIS — I110 Hypertensive heart disease with heart failure: Secondary | ICD-10-CM

## 2019-09-27 DIAGNOSIS — Z79899 Other long term (current) drug therapy: Secondary | ICD-10-CM

## 2019-09-27 DIAGNOSIS — Z7901 Long term (current) use of anticoagulants: Secondary | ICD-10-CM | POA: Diagnosis not present

## 2019-09-27 DIAGNOSIS — I480 Paroxysmal atrial fibrillation: Secondary | ICD-10-CM | POA: Diagnosis not present

## 2019-09-27 NOTE — Patient Instructions (Signed)
Medication Instructions:  Your physician recommends that you continue on your current medications as directed. Please refer to the Current Medication list given to you today. *If you need a refill on your cardiac medications before your next appointment, please call your pharmacy*   Lab Work: Your physician recommends that you return for lab work in: [place]  If you have labs (blood work) drawn today and your tests are completely normal, you will receive your results only by: Marland Kitchen MyChart Message (if you have MyChart) OR . A paper copy in the mail If you have any lab test that is abnormal or we need to change your treatment, we will call you to review the results.   Testing/Procedures: None   Follow-Up: At [facility], you and your health needs are our priority.  As part of our continuing mission to provide you with exceptional heart care, we have created designated Provider Care Teams.  These Care Teams include your primary Cardiologist (physician) and Advanced Practice Providers (APPs -  Physician Assistants and Nurse Practitioners) who all work together to provide you with the care you need, when you need it.  We recommend signing up for the patient portal called "MyChart".  Sign up information is provided on this After Visit Summary.  MyChart is used to connect with patients for Virtual Visits (Telemedicine).  Patients are able to view lab/test results, encounter notes, upcoming appointments, etc.  Non-urgent messages can be sent to your provider as well.   To learn more about what you can do with MyChart, go to [email or web address].    Your next appointment:   12 month(s)  The format for your next appointment:   In Person  Provider:   Shirlee More, MD   Other Instructions

## 2019-09-27 NOTE — Progress Notes (Signed)
Cardiology Office Note:    Date:  09/27/2019   ID:  Stephanie Douglas, DOB [birth year], MRN [id number]  PCP:  Lillard Anes, MD  Cardiologist:  Shirlee More, MD    Referring MD: Laurence Spates do a CMP and TSH in your office every 6 months which she is on amiodarone   ASSESSMENT:    1. Paroxysmal atrial fibrillation (HCC)   2. On amiodarone therapy   3. Chronic anticoagulation   4. Hypertensive heart disease with heart failure ([place])    PLAN:    In order of problems listed above:  1. She has done well maintaining sinus rhythm continue low-dose amiodarone and current anticoagulant.  Today I will draw CMP and a TSH and I will remind Dr. Henrene Pastor to check her labs in 5-month intervals and I will see her back in 1 year. 2. Hypertension stable.  Her blood pressure is at target continue current treatment blocker and diuretic check renal function potassium 3. Stable hyperlipidemia continue her statin lipids at target   Next appointment: 1 year   Medication Adjustments/Labs and Tests Ordered: Current medicines are reviewed at length with the patient today.  Concerns regarding medicines are outlined above.  No orders of the defined types were placed in this encounter.  No orders of the defined types were placed in this encounter.   No chief complaint on file.   History of Present Illness:    Stephanie Douglas is a 86 y.o. female with a hx of paroxysmal atrial fibrillation hypertensive heart disease with heart failure chronic anticoagulation on amiodarone therapy last seen 09/03/2018. Compliance with diet, lifestyle and medications: Yes  She has had a good year no episodes of atrial fibrillation tolerates amiodarone without side effects and anticoagulant without bleeding complication.  Unfortunately she has trouble affording her inhaler Trelegy.  No edema shortness of breath chest pain palpitation or syncope.  Her labs are followed through PCP office in November renal  function was normal and her last lipid profile cholesterol 152 HDL 29 LDL 56 TSH 1.7. Past Medical History:  Diagnosis Date  . COPD (chronic obstructive pulmonary disease) ([place])   . Depression   . HTN (hypertension)   . Hyperlipidemia   . PONV (postoperative nausea and vomiting)     Past Surgical History:  Procedure Laterality Date  . ABDOMINAL HYSTERECTOMY    . CARDIOVERSION N/A 06/13/2017   Procedure: CARDIOVERSION;  Surgeon: Lelon Perla, MD;  Location: [facility] ENDOSCOPY;  Service: Cardiovascular;  Laterality: N/A;  . CARDIOVERSION N/A 07/07/2017   Procedure: CARDIOVERSION;  Surgeon: Sanda Klein, MD;  Location: [facility] ENDOSCOPY;  Service: Cardiovascular;  Laterality: N/A;  . TEE WITHOUT CARDIOVERSION N/A 06/13/2017   Procedure: TRANSESOPHAGEAL ECHOCARDIOGRAM (TEE);  Surgeon: Lelon Perla, MD;  Location: [facility] ENDOSCOPY;  Service: Cardiovascular;  Laterality: N/A;    Current Medications: Current Meds  Medication Sig  . Acetaminophen (TYLENOL) 325 MG CAPS Take by mouth.  Marland Kitchen amiodarone (PACERONE) 200 MG tablet TAKE 1 TABLET BY MOUTH EVERY DAY  . apixaban (ELIQUIS) 5 MG TABS tablet Take 1 tablet (5 mg total) by mouth 2 (two) times daily.  Marland Kitchen atorvastatin (LIPITOR) 20 MG tablet Take 1 tablet (20 mg total) by mouth daily.  Marland Kitchen CALCIUM ASCORBATE PO Take daily by mouth.  . carvedilol (COREG) 3.125 MG tablet TAKE 1 TABLET TWICE DAILY WITH MEALS.  Marland Kitchen Fluticasone-Umeclidin-Vilant (TRELEGY ELLIPTA) 100-62.5-25 MCG/INH AEPB Inhale 1 Inhaler into the lungs daily as needed (One Inhalation once Daily).  . furosemide (  LASIX) 20 MG tablet Take 1 tablet (20 mg total) by mouth 3 (three) times a week. Take any day you weigh 173 lbs or less  . loratadine (CLARITIN) 10 MG tablet Take 10 mg daily as needed by mouth for allergies.  . Multiple Vitamin (MULTI VITAMIN) TABS Take 1 tablet by mouth daily at 2 PM.  . polyethylene glycol (MIRALAX / GLYCOLAX) packet Take 17 g daily as needed by mouth.      Allergies:   Patient has no known allergies.   Social History   Socioeconomic History  . Marital status: Widowed    Spouse name: Not on file  . Number of children: Not on file  . Years of education: Not on file  . Highest education level: Not on file  Occupational History  . Not on file  Tobacco Use  . Smoking status: Never Smoker  . Smokeless tobacco: Never Used  Substance and Sexual Activity  . Alcohol use: No  . Drug use: No  . Sexual activity: Not Currently  Other Topics Concern  . Not on file  Social History Narrative  . Not on file   Social Determinants of Health   Financial Resource Strain:   . Difficulty of Paying Living Expenses: Not on file  Food Insecurity:   . Worried About Charity fundraiser in the Last Year: Not on file  . Ran Out of Food in the Last Year: Not on file  Transportation Needs:   . Lack of Transportation (Medical): Not on file  . Lack of Transportation (Non-Medical): Not on file  Physical Activity:   . Days of Exercise per Week: Not on file  . Minutes of Exercise per Session: Not on file  Stress:   . Feeling of Stress : Not on file  Social Connections:   . Frequency of Communication with Friends and Family: Not on file  . Frequency of Social Gatherings with Friends and Family: Not on file  . Attends Religious Services: Not on file  . Active Member of Clubs or Organizations: Not on file  . Attends Archivist Meetings: Not on file  . Marital Status: Not on file     Family History: The patient's family history includes AAA (abdominal aortic aneurysm) in her sister; Asthma in her father; Breast cancer in her sister; CAD in her mother; Cardiomyopathy in her mother; Diabetes in her brother and mother; Hypertension in her brother. ROS:   Please see the history of present illness.    All other systems reviewed and are negative.  EKGs/Labs/Other Studies Reviewed:    The following studies were reviewed today:  EKG:  EKG ordered  today and personally reviewed.  The ekg ordered today demonstrates sinus rhythm normal QT interval   Physical Exam:    VS:  BP 130/78   Pulse 60   Temp 97.7 F (36.5 C)   Ht 5\' 5"  (1.651 m)   Wt 183 lb (83 kg)   SpO2 93%   BMI 30.45 kg/m     Wt Readings from Last 3 Encounters:  09/27/19 183 lb (83 kg)  09/03/18 188 lb 8 oz (85.5 kg)  03/02/18 185 lb (83.9 kg)     GEN:  Well nourished, well developed in no acute distress HEENT: Normal NECK: No JVD; No carotid bruits LYMPHATICS: No lymphadenopathy CARDIAC: RRR, no murmurs, rubs, gallops RESPIRATORY:  Clear to auscultation without rales, wheezing or rhonchi  ABDOMEN: Soft, non-tender, non-distended MUSCULOSKELETAL:  No edema; No deformity  SKIN:  Warm and dry NEUROLOGIC:  Alert and oriented x 3 PSYCHIATRIC:  Normal affect    Signed, Shirlee More, MD  09/27/2019 9:06 AM    [place]

## 2019-09-28 LAB — COMPREHENSIVE METABOLIC PANEL
ALT: 35 IU/L — ABNORMAL HIGH (ref 0–32)
AST: 34 IU/L (ref 0–40)
Albumin/Globulin Ratio: 1.1 — ABNORMAL LOW (ref 1.2–2.2)
Albumin: 3.7 g/dL (ref 3.6–4.6)
Alkaline Phosphatase: 104 IU/L (ref 39–117)
BUN/Creatinine Ratio: 17 (ref 12–28)
BUN: 18 mg/dL (ref 8–27)
Bilirubin Total: 0.4 mg/dL (ref 0.0–1.2)
CO2: 27 mmol/L (ref 20–29)
Calcium: 9 mg/dL (ref 8.7–10.3)
Chloride: 100 mmol/L (ref 96–106)
Creatinine, Ser: 1.05 mg/dL — ABNORMAL HIGH (ref 0.57–1.00)
GFR calc Af Amer: 57 mL/min/{1.73_m2} — ABNORMAL LOW (ref >59)
GFR calc non Af Amer: 49 mL/min/{1.73_m2} — ABNORMAL LOW (ref >59)
Globulin, Total: 3.3 g/dL (ref 1.5–4.5)
Glucose: 90 mg/dL (ref 65–99)
Potassium: 4.4 mmol/L (ref 3.5–5.2)
Sodium: 139 mmol/L (ref 134–144)
Total Protein: 7 g/dL (ref 6.0–8.5)

## 2019-09-28 LAB — TSH: TSH: 3.31 u[IU]/mL (ref 0.450–4.500)

## 2019-10-02 ENCOUNTER — Other Ambulatory Visit: Payer: Self-pay | Admitting: Cardiology

## 2019-10-02 DIAGNOSIS — Z79899 Other long term (current) drug therapy: Secondary | ICD-10-CM

## 2019-10-25 ENCOUNTER — Telehealth: Payer: Self-pay | Admitting: Legal Medicine

## 2019-10-25 NOTE — Progress Notes (Signed)
  Chronic Care Management   Outreach Note  10/25/2019 Name: Stephanie Douglas MRN: [id number] DOB: Oct 21, 1933  Referred by: Lillard Anes, MD Reason for referral : No chief complaint on file.   An unsuccessful telephone outreach was attempted today. The patient was referred to the pharmacist for assistance with care management and care coordination.   Follow Up Plan:   Earney Hamburg Upstream Scheduler

## 2019-10-28 ENCOUNTER — Other Ambulatory Visit: Payer: Self-pay

## 2019-10-28 MED ORDER — ATORVASTATIN CALCIUM 20 MG PO TABS: 20.0000 mg | ORAL_TABLET | Freq: Every day | ORAL | 2 refills | Status: DC

## 2019-10-29 ENCOUNTER — Telehealth: Payer: Self-pay

## 2019-10-29 NOTE — Telephone Encounter (Signed)
The patient left a message stating she had a missed call about making an appointment. I tried to call the patient back multiple times but the phone was busy and I was unable to leave a message. I spoke with Dr. Blanch Media nurse Lemmie Evens who stated the patient needs a chronic fasting appointment.

## 2019-11-04 ENCOUNTER — Telehealth: Payer: Self-pay

## 2019-11-04 NOTE — Telephone Encounter (Signed)
LM for the pt to call back to schedule a chronic fasting appointment.

## 2019-11-05 ENCOUNTER — Telehealth: Payer: Self-pay | Admitting: Legal Medicine

## 2019-11-05 NOTE — Progress Notes (Signed)
  Chronic Care Management   Outreach Note  11/05/2019 Name: Stephanie Douglas MRN: [id number] DOB: 31-Aug-1933  Referred by: Lillard Anes, MD Reason for referral : No chief complaint on file.   An unsuccessful telephone outreach was attempted today. The patient was referred to the pharmacist for assistance with care management and care coordination.   Follow Up Plan:   Earney Hamburg Upstream Scheduler

## 2019-11-06 ENCOUNTER — Telehealth: Payer: Self-pay | Admitting: Legal Medicine

## 2019-11-06 NOTE — Progress Notes (Signed)
  Chronic Care Management   Note  11/06/2019 Name: ELLEANOR ZYWICKI MRN: [id number] DOB: [birth year]  JORDI GUTERMUTH is a 86 y.o. year old female who is a primary care patient of Lillard Anes, MD. I reached out to [place] by phone today in response to a referral sent by Ms. Deatra James Fujikawa's PCP, Lillard Anes, MD.   Ms. Voorheis was given information about Chronic Care Management services today including:  1. CCM service includes personalized support from designated clinical staff supervised by her physician, including individualized plan of care and coordination with other care providers 2. 24/7 contact phone numbers for assistance for urgent and routine care needs. 3. Service will only be billed when office clinical staff spend 20 minutes or more in a month to coordinate care. 4. Only one practitioner may furnish and bill the service in a calendar month. 5. The patient may stop CCM services at any time (effective at the end of the month) by phone call to the office staff.   Patient agreed to services and verbal consent obtained.   Follow up plan:   Earney Hamburg Upstream Scheduler

## 2019-11-11 ENCOUNTER — Ambulatory Visit (INDEPENDENT_AMBULATORY_CARE_PROVIDER_SITE_OTHER): Payer: PPO | Admitting: Legal Medicine

## 2019-11-11 ENCOUNTER — Other Ambulatory Visit: Payer: Self-pay

## 2019-11-11 ENCOUNTER — Encounter: Payer: Self-pay | Admitting: Legal Medicine

## 2019-11-11 VITALS — BP 140/90 | HR 60 | Temp 97.2°F | Ht 65.0 in | Wt 180.0 lb

## 2019-11-11 DIAGNOSIS — E782 Mixed hyperlipidemia: Secondary | ICD-10-CM | POA: Diagnosis not present

## 2019-11-11 DIAGNOSIS — I5042 Chronic combined systolic (congestive) and diastolic (congestive) heart failure: Secondary | ICD-10-CM | POA: Diagnosis not present

## 2019-11-11 DIAGNOSIS — I110 Hypertensive heart disease with heart failure: Secondary | ICD-10-CM | POA: Diagnosis not present

## 2019-11-11 NOTE — Progress Notes (Signed)
Established Patient Office Visit  Subjective:  Patient ID: Stephanie Douglas, female    DOB: [birth year]  Age: 86 y.o. MRN: [id number]  CC:  Chief Complaint  Patient presents with  . Hypertension    Chronic follow up  . Hyperlipidemia    HPI Stephanie Douglas presents for Chronic visit.  Patient presents for follow up of hypertension.  Patient tolerating carvedilol well with side effects.  Patient was diagnosed with hypertension 2010 so has been treated for hypertension for 10 years.Patient is working on maintaining diet and exercise regimen and follows up as directed. Complication include CHF  Patient presents with hyperlipidemia.  Compliance with treatment has been good; patient takes medicines as directed, maintains low cholesterol diet, follows up as directed, and maintains exercise regimen.  Patient is using atorvastatin without problems.  Patient presents with Systolic and diabstolic  that is stable. Diagnosis made 5 years.  The course of the disease is stable.  Current medicines include furosemide, carvedilol, amiodarone. Patient follows a low cholesterol diet and maintains a weight diary.  Patient is on low salt, low cholesterol diet and avoids alcohol.  Patient denies adverse effects of medicines. Patient is monitoring weight and has no change of weight.  Patient is having no pedal edema, no PND and no PND.  Patient is continuing to see cardiology.  Past Medical History:  Diagnosis Date  . COPD (chronic obstructive pulmonary disease) ([place])   . Depression   . HTN (hypertension)   . Hyperlipidemia   . PONV (postoperative nausea and vomiting)     Past Surgical History:  Procedure Laterality Date  . ABDOMINAL HYSTERECTOMY    . CARDIOVERSION N/A 06/13/2017   Procedure: CARDIOVERSION;  Surgeon: Lelon Perla, MD;  Location: [facility] ENDOSCOPY;  Service: Cardiovascular;  Laterality: N/A;  . CARDIOVERSION N/A 07/07/2017   Procedure: CARDIOVERSION;  Surgeon: Sanda Klein, MD;   Location: [facility] ENDOSCOPY;  Service: Cardiovascular;  Laterality: N/A;  . TEE WITHOUT CARDIOVERSION N/A 06/13/2017   Procedure: TRANSESOPHAGEAL ECHOCARDIOGRAM (TEE);  Surgeon: Lelon Perla, MD;  Location[facility] ENDOSCOPY;  Service: Cardiovascular;  Laterality: N/A;    Family History  Problem Relation Age of Onset  . CAD Mother   . Cardiomyopathy Mother   . Diabetes Mother   . Asthma Father   . Breast cancer Sister   . Diabetes Brother   . Hypertension Brother   . AAA (abdominal aortic aneurysm) Sister     Social History   Socioeconomic History  . Marital status: Widowed    Spouse name: Not on file  . Number of children: Not on file  . Years of education: Not on file  . Highest education level: Not on file  Occupational History  . Not on file  Tobacco Use  . Smoking status: Never Smoker  . Smokeless tobacco: Never Used  Substance and Sexual Activity  . Alcohol use: No  . Drug use: No  . Sexual activity: Not Currently  Other Topics Concern  . Not on file  Social History Narrative  . Not on file   Social Determinants of Health   Financial Resource Strain:   . Difficulty of Paying Living Expenses:   Food Insecurity:   . Worried About Charity fundraiser in the Last Year:   . Arboriculturist in the Last Year:   Transportation Needs:   . Film/video editor (Medical):   Marland Kitchen Lack of Transportation (Non-Medical):   Physical Activity:   . Days of Exercise  per Week:   . Minutes of Exercise per Session:   Stress:   . Feeling of Stress :   Social Connections:   . Frequency of Communication with Friends and Family:   . Frequency of Social Gatherings with Friends and Family:   . Attends Religious Services:   . Active Member of Clubs or Organizations:   . Attends Archivist Meetings:   Marland Kitchen Marital Status:   Intimate Partner Violence:   . Fear of Current or Ex-Partner:   . Emotionally Abused:   Marland Kitchen Physically Abused:   . Sexually Abused:     Outpatient  Medications Prior to Visit  Medication Sig Dispense Refill  . Acetaminophen (TYLENOL) 325 MG CAPS Take by mouth.    Marland Kitchen amiodarone (PACERONE) 200 MG tablet TAKE 1 TABLET BY MOUTH EVERY DAY 90 tablet 1  . apixaban (ELIQUIS) 5 MG TABS tablet Take 1 tablet (5 mg total) by mouth 2 (two) times daily. 180 tablet 2  . atorvastatin (LIPITOR) 20 MG tablet Take 1 tablet (20 mg total) by mouth daily. 90 tablet 2  . CALCIUM ASCORBATE PO Take daily by mouth.    . carvedilol (COREG) 3.125 MG tablet TAKE 1 TABLET TWICE DAILY WITH MEALS. 180 tablet 1  . Fluticasone-Umeclidin-Vilant (TRELEGY ELLIPTA) 100-62.5-25 MCG/INH AEPB Inhale 1 Inhaler into the lungs daily as needed (One Inhalation once Daily). 60 each 6  . furosemide (LASIX) 20 MG tablet Take 1 tablet (20 mg total) by mouth 3 (three) times a week. Take any day you weigh 173 lbs or less 90 tablet 3  . loratadine (CLARITIN) 10 MG tablet Take 10 mg daily as needed by mouth for allergies.    . Multiple Vitamin (MULTI VITAMIN) TABS Take 1 tablet by mouth daily at 2 PM. 30 tablet   . polyethylene glycol (MIRALAX / GLYCOLAX) packet Take 17 g daily as needed by mouth.     No facility-administered medications prior to visit.    No Known Allergies  ROS Review of Systems  Constitutional: Negative.   HENT: Negative.   Eyes: Negative.   Respiratory: Positive for shortness of breath.   Cardiovascular: Negative.   Gastrointestinal: Negative.   Endocrine: Negative.   Genitourinary: Negative.   Musculoskeletal: Negative.   Skin: Negative.   Neurological: Negative.   Psychiatric/Behavioral: Negative.       Objective:    Physical Exam  Constitutional: She is oriented to person, place, and time. She appears well-developed and well-nourished.  HENT:  Head: Normocephalic and atraumatic.  Right Ear: External ear normal.  Left Ear: External ear normal.  Nose: Nose normal.  Mouth/Throat: Oropharynx is clear and moist.  Eyes: Pupils are equal, round, and  reactive to light. Conjunctivae and EOM are normal.  Cardiovascular: Normal rate, normal heart sounds and intact distal pulses.  irregularly irregular  Pulmonary/Chest: Effort normal and breath sounds normal.  Abdominal: Soft. Bowel sounds are normal.  Musculoskeletal:        General: Normal range of motion.     Cervical back: Neck supple.  Neurological: She is alert and oriented to person, place, and time. She has normal reflexes.  Skin: Skin is warm and dry.  Psychiatric: She has a normal mood and affect.  Vitals reviewed.   BP 140/90 (BP Location: Left Arm, Patient Position: Sitting)   Pulse 60   Temp (!) 97.2 F (36.2 C) (Temporal)   Ht 5\' 5"  (1.651 m)   Wt 180 lb (81.6 kg)   SpO2 98%  BMI 29.95 kg/m  Wt Readings from Last 3 Encounters:  11/11/19 180 lb (81.6 kg)  09/27/19 183 lb (83 kg)  09/03/18 188 lb 8 oz (85.5 kg)     Health Maintenance Due  Topic Date Due  . TETANUS/TDAP  Never done  . DEXA SCAN  Never done  . PNA vac Low Risk Adult (1 of 2 - PCV13) Never done    There are no preventive care reminders to display for this patient.  Lab Results  Component Value Date   TSH 3.310 09/27/2019   Lab Results  Component Value Date   WBC 8.9 07/23/2017   HGB 14.7 07/23/2017   HCT 44.5 07/23/2017   MCV 90.1 07/23/2017   PLT 190 07/23/2017   Lab Results  Component Value Date   NA 139 09/27/2019   K 4.4 09/27/2019   CO2 27 09/27/2019   GLUCOSE 90 09/27/2019   BUN 18 09/27/2019   CREATININE 1.05 (H) 09/27/2019   BILITOT 0.4 09/27/2019   ALKPHOS 104 09/27/2019   AST 34 09/27/2019   ALT 35 (H) 09/27/2019   PROT 7.0 09/27/2019   ALBUMIN 3.7 09/27/2019   CALCIUM 9.0 09/27/2019   ANIONGAP 10 07/23/2017   No results found for: CHOL No results found for: HDL No results found for: LDLCALC No results found for: TRIG No results found for: CHOLHDL No results found for: HGBA1C    Assessment & Plan:   Problem List Items Addressed This Visit       Cardiovascular and Mediastinum   Chronic combined systolic and diastolic heart failure ([place]) - Primary    An individualized care plan was established and reinforced for CHF.  The patient's disease status was assessed using clinical finding son exam today, labs, and/or other diagnostic testing such as x-rays, to determine the patient's success in meeting treatmentgoalsbased on disease-based guidelines and found to beimproving. But not at goal yet. Medications prescriptions no change Laboratory tests ordered to be performed today include routine labs. RECOMMENDATIONS: given include continue present treatment.  Call physician is patient gains 3 lbs in one day or 5 lbs for one week.  Call for progressive PND, orthopnea or increased pedal edema.      Hypertensive heart disease with heart failure [facility])    An individual hypertension care plan was established and reinforced today.  The patient's status was assessed using clinical findings on exam and labs or diagnostic tests. The patient's success at meeting treatment goals on disease specific evidence-based guidelines and found to be well controlled. SELF MANAGEMENT: The patient and I together assessed ways to personally work towards obtaining the recommended goals. RECOMMENDATIONS: avoid decongestants found in common cold remedies, decrease consumption of alcohol, perform routine monitoring of BP with home BP cuff, exercise, reduction of dietary salt, take medicines as prescribed, try not to miss doses and quit smoking.  Regular exercise and maintaining a healthy weight is needed.  Stress reduction may help. A CLINICAL SUMMARY including written plan identify barriers to care unique to individual due to social or financial issues.  We attempt to mutually creat solutions for individual and family understanding.        Other   Mixed hyperlipidemia    AN INDIVIDUAL CARE PLAN for hyperlipidemia/ cholesterol was established and reinforced today.  The patient's  status was assessed using clinical findings on exam, lab and other diagnostic tests. The patient's disease status was assessed based on evidence-based guidelines and found to be well controlled. MEDICATIONS were reviewed. SELF MANAGEMENT  GOALS have been discussed and patient's success at attaining the goal of low cholesterol was assessed. RECOMMENDATION given include regular exercise 3 days a week and low cholesterol/low fat diet. CLINICAL SUMMARY including written plan to identify barriers unique to the patient due to social or economic  reasons was discussed.      Relevant Orders   CBC with Differential   Comprehensive metabolic panel   Lipid Panel      No orders of the defined types were placed in this encounter.   Follow-up: Return in about 4 months (around 03/12/2020) for fasting.    [name], MD

## 2019-11-11 NOTE — Assessment & Plan Note (Signed)

## 2019-11-11 NOTE — Assessment & Plan Note (Signed)

## 2019-11-11 NOTE — Assessment & Plan Note (Signed)
An individualized care plan was established and reinforced for CHF.  The patient's disease status was assessed using clinical finding son exam today, labs, and/or other diagnostic testing such as x-rays, to determine the patient's success in meeting treatmentgoalsbased on disease-based guidelines and found to beimproving. But not at goal yet. Medications prescriptions no change Laboratory tests ordered to be performed today include routine labs. RECOMMENDATIONS: given include continue present treatment.  Call physician is patient gains 3 lbs in one day or 5 lbs for one week.  Call for progressive PND, orthopnea or increased pedal edema.

## 2019-11-12 LAB — CBC WITH DIFFERENTIAL/PLATELET
Basophils Absolute: 0.1 10*3/uL (ref 0.0–0.2)
Basos: 1 %
EOS (ABSOLUTE): 0.2 10*3/uL (ref 0.0–0.4)
Eos: 2 %
Hematocrit: 41.4 % (ref 34.0–46.6)
Hemoglobin: 14 g/dL (ref 11.1–15.9)
Immature Grans (Abs): 0 10*3/uL (ref 0.0–0.1)
Immature Granulocytes: 0 %
Lymphocytes Absolute: 1.5 10*3/uL (ref 0.7–3.1)
Lymphs: 20 %
MCH: 32.6 pg (ref 26.6–33.0)
MCHC: 33.8 g/dL (ref 31.5–35.7)
MCV: 97 fL (ref 79–97)
Monocytes Absolute: 0.7 10*3/uL (ref 0.1–0.9)
Monocytes: 9 %
Neutrophils Absolute: 5.3 10*3/uL (ref 1.4–7.0)
Neutrophils: 68 %
Platelets: 147 10*3/uL — ABNORMAL LOW (ref 150–450)
RBC: 4.29 x10E6/uL (ref 3.77–5.28)
RDW: 13.3 % (ref 11.7–15.4)
WBC: 7.8 10*3/uL (ref 3.4–10.8)

## 2019-11-12 LAB — COMPREHENSIVE METABOLIC PANEL
ALT: 32 IU/L (ref 0–32)
AST: 33 IU/L (ref 0–40)
Albumin/Globulin Ratio: 1.3 (ref 1.2–2.2)
Albumin: 4 g/dL (ref 3.6–4.6)
Alkaline Phosphatase: 95 IU/L (ref 39–117)
BUN/Creatinine Ratio: 17 (ref 12–28)
BUN: 15 mg/dL (ref 8–27)
Bilirubin Total: 0.5 mg/dL (ref 0.0–1.2)
CO2: 23 mmol/L (ref 20–29)
Calcium: 9.5 mg/dL (ref 8.7–10.3)
Chloride: 103 mmol/L (ref 96–106)
Creatinine, Ser: 0.86 mg/dL (ref 0.57–1.00)
GFR calc Af Amer: 72 mL/min/{1.73_m2} (ref >59)
GFR calc non Af Amer: 62 mL/min/{1.73_m2} (ref >59)
Globulin, Total: 3.1 g/dL (ref 1.5–4.5)
Glucose: 95 mg/dL (ref 65–99)
Potassium: 4.6 mmol/L (ref 3.5–5.2)
Sodium: 141 mmol/L (ref 134–144)
Total Protein: 7.1 g/dL (ref 6.0–8.5)

## 2019-11-12 LAB — LIPID PANEL
Chol/HDL Ratio: 3.8 ratio (ref 0.0–4.4)
Cholesterol, Total: 135 mg/dL (ref 100–199)
HDL: 36 mg/dL — ABNORMAL LOW (ref >39)
LDL Chol Calc (NIH): 82 mg/dL (ref 0–99)
Triglycerides: 91 mg/dL (ref 0–149)
VLDL Cholesterol Cal: 17 mg/dL (ref 5–40)

## 2019-11-12 LAB — CARDIOVASCULAR RISK ASSESSMENT

## 2019-11-12 NOTE — Progress Notes (Signed)
Platelets 147 slightly low, rest of CBC normal, kidney tests stable, one liver test slightly up, Cholesterol normal lp

## 2019-11-15 NOTE — Chronic Care Management (AMB) (Signed)
Chronic Care Management Pharmacy  Name: Stephanie Douglas  MRN: [id number] DOB: 1934/07/03  Chief Complaint/ HPI  Stephanie Douglas,  86 y.o. , female presents for their Initial CCM visit with the clinical pharmacist via telephone due to COVID-19 Pandemic.  PCP : Lillard Anes, MD  Their chronic conditions include: Atrial flutter, Afib, COPD, Hypertensive Heart disease with Heart failure, Combined systolic and diastolic heart failure, HLD.  Office Visits: 11/11/2019 - Platelets 147 slightly low, rest of CBC normal, kidney tests stable, one liver test slightly up, Cholesterol normal 05/27/2019 - Routine visit. Labs drawn. Eliquis refilled. GFR 55, Lipids good with low HDL. BNP good. Dexa scan revealed osteoporosis - needs fosamax or Prolia per Dr. Henrene Pastor.   Consult Visit: 09/27/2019 - Dr. Bettina Gavia visit. No changes. Noted that thyroid and CMP need to be checked q6 months. Also noted that patient struggles to afford inhaler.  07/05/2019 - dry eyes seen by Dr. Pamelia Hoit.  06/11/2019 - [facility]. Skin cysts and melanocytic nevi.   Medications: Outpatient Encounter Medications as of 11/18/2019  Medication Sig  . Acetaminophen (TYLENOL PO) Take 500 mg by mouth at bedtime.   Marland Kitchen amiodarone (PACERONE) 200 MG tablet TAKE 1 TABLET BY MOUTH EVERY DAY  . apixaban (ELIQUIS) 5 MG TABS tablet Take 1 tablet (5 mg total) by mouth 2 (two) times daily.  Marland Kitchen atorvastatin (LIPITOR) 20 MG tablet Take 1 tablet (20 mg total) by mouth daily.  Marland Kitchen CALCIUM ASCORBATE PO Take daily by mouth.  . carvedilol (COREG) 3.125 MG tablet TAKE 1 TABLET TWICE DAILY WITH MEALS.  Marland Kitchen Fluticasone-Umeclidin-Vilant (TRELEGY ELLIPTA) 100-62.5-25 MCG/INH AEPB Inhale 1 Inhaler into the lungs daily as needed (One Inhalation once Daily).  . furosemide (LASIX) 20 MG tablet Take 1 tablet (20 mg total) by mouth 3 (three) times a week. Take any day you weigh 173 lbs or less  . loratadine (CLARITIN) 10 MG tablet Take 10 mg daily  as needed by mouth for allergies.  . Multiple Vitamin (MULTI VITAMIN) TABS Take 1 tablet by mouth daily at 2 PM.  . polyethylene glycol (MIRALAX / GLYCOLAX) packet Take 17 g by mouth daily.    No facility-administered encounter medications on file as of 11/18/2019.   No Known Allergies  SDOH Screenings   Alcohol Screen:   . Last Alcohol Screening Score (AUDIT):   Depression (PHQ2-9):   . PHQ-2 Score:   Financial Resource Strain:   . Difficulty of Paying Living Expenses:   Food Insecurity:   . Worried About Charity fundraiser in the Last Year:   . [place] in the Last Year:   Housing:   . Last Housing Risk Score:   Physical Activity: Insufficiently Active  . Days of Exercise per Week: 5 days  . Minutes of Exercise per Session: 10 min  Social Connections:   . Frequency of Communication with Friends and Family:   . Frequency of Social Gatherings with Friends and Family:   . Attends Religious Services:   . Active Member of Clubs or Organizations:   . Attends Archivist Meetings:   Marland Kitchen Marital Status:   Stress:   . Feeling of Stress :   Tobacco Use: Low Risk   . Smoking Tobacco Use: Never Smoker  . Smokeless Tobacco Use: Never Used  Transportation Needs: No Transportation Needs  . Lack of Transportation (Medical): No  . Lack of Transportation (Non-Medical): No     Current Diagnosis/Assessment:  Goals Addressed  This Visit's Progress   . Pharmacy Care Plan - HLD       CARE PLAN ENTRY (see longitudinal plan of care for additional care plan information)  Current Barriers:  . Uncontrolled hyperlipidemia, complicated by age and heart failure . Current antihyperlipidemic regimen: atorvastatin 20 mg daily . Previous antihyperlipidemic medications tried n/a . Most recent lipid panel:     Component Value Date/Time   CHOL 135 11/11/2019 0908   TRIG 91 11/11/2019 0908   HDL 36 (L) 11/11/2019 0908   CHOLHDL 3.8 11/11/2019 0908   LDLCALC 82  11/11/2019 0908 .   Marland Kitchen ASCVD risk enhancing conditions: age >81, DM, HTN, CKD, CHF, current smoker . 10-year ASCVD risk score: age limits calculation  Pharmacist Clinical Goal(s):  Marland Kitchen Over the next 30 days, patient will work with PharmD and providers towards optimized antihyperlipidemic therapy  Interventions: . Comprehensive medication review performed; medication list updated in electronic medical record.  Bertram Savin care team collaboration (see longitudinal plan of care) . Recommend patient resume regular exercise classes when they begin again.  Patient Self Care Activities:  . Patient will focus on medication adherence by continuing to use weekly pill box.  Initial goal documentation     . Pharmacy Care Plan BP       CARE PLAN ENTRY (see longitudinal plan of care for additional care plan information)  Current Barriers:  . Uncontrolled hypertension, complicated by Afib . Current antihypertensive regimen: carvedilol 3.125 bid . Previous antihypertensives tried: amlodipine, Entresto . Last practice recorded BP readings:  BP Readings from Last 3 Encounters:  11/11/19 140/90  09/27/19 130/78  09/03/18 130/74 .   Marland Kitchen Current home BP readings: around 140/80 mmHg . Most recent eGFR/CrCl: No results found for: EGFR  No components found for: CRCL  Pharmacist Clinical Goal(s):  Marland Kitchen Over the next 30 days, patient will work with PharmD and providers to optimize antihypertensive regimen . Recommend continuing watching sodium in diet for optimal blood pressure control.   Interventions: . Inter-disciplinary care team collaboration (see longitudinal plan of care) . Comprehensive medication review performed; medication list updated in the electronic medical record.  . Patient will take blood pressures three times weekly and record log until next visit with Pharmacist to review.   Patient Self Care Activities:  . Patient will continue to check BP three times weekly , document, and  provide at future appointments . Patient will focus on medication adherence by continuing to use weekly pill box.   Initial goal documentation     . Pharmacy Care Plan Eliquis       CARE PLAN ENTRY (see longitudinal plan of care for additional care plan information)  Current Barriers:  . Financial Barriers: patient has [employer] insurance and reports copay for Eliquis is cost prohibitive at this time . Eliquis for anticoagulation for Afib: Patient is taking but is very expensive and she reaches the coverage gap each year.    Pharmacist Clinical Goal(s):  Marland Kitchen Over the next 30 days, patient will work with PharmD and providers to relieve medication access concerns  Interventions: . Comprehensive medication review completed; medication list updated in electronic medical record.  Bertram Savin care team collaboration (see longitudinal plan of care) . Eliquis by [place]: Patient meets income/out of pocket spend criteria for this medication's patient assistance program. Reviewed application process. Patient will provide proof of income, out of pocket spend report, and will sign application. Will collaborate with primary care provider Dr. Henrene Pastor for their  portion of application. Once completed, will submit to [place] assistance program.  Patient Self Care Activities:  . Patient will provide necessary portions of application   Initial goal documentation        Heart Failure - managed by Dr. Bettina Gavia   Type: Hypertensive Heart disease with heart failure  Last ejection fraction: 35-40%  Patient has failed these meds in past: amlodipine, entresto Patient is currently controlled on the following medications: carvedilol 3.125 mg bid, furosemide 20 mg three times weekly  We discussed weighing daily; if you gain more than 3 pounds in one day or 5 pounds in one week call your doctor. Weight was 178 lbs today. Eats crackers without salt, yogurt and  bananas. Loves a good breakfast - [place] bacon, egg, toast. Used to exercise at a 45 minute class two times a week before COVID in [place]. Does some light exercise at home during COVID - tries to do a little each day.   Plan  Continue current medications   AFIB - Managed by Dr. Bettina Gavia   Patient is currently rhythm controlled. HR 61 BPM  Patient has failed these meds in past: digoxin 0.125 mg, metoprolol Patient is currently controlled on the following medications: amiodarone 200 mg daily, eliquis 5 mg bid  We discussed:  diet and exercise extensively.   Plan  Continue current medications   Hyperlipidemia   Lipid Panel     Component Value Date/Time   CHOL 135 11/11/2019 0908   TRIG 91 11/11/2019 0908   HDL 36 (L) 11/11/2019 0908   CHOLHDL 3.8 11/11/2019 0908   LDLCALC 82 11/11/2019 0908   LABVLDL 17 11/11/2019 0908     The ASCVD Risk score (Goff DC Jr., et al., 2013) failed to calculate for the following reasons:   The 2013 ASCVD risk score is only valid for ages 67 to 52   Patient has failed these meds in past: fish oil Patient is currently controlled on the following medications: atorvastatin 20 mg daily  We discussed:  diet and exercise extensively.   Plan  Continue current medications   COPD / Asthma / Tobacco   Eosinophil count:  No results found for: EOSPCT%                               Eos (Absolute):  Lab Results  Component Value Date/Time   EOSABS 0.2 11/11/2019 09:08 AM    Tobacco Status:  Social History   Tobacco Use  Smoking Status Never Smoker  Smokeless Tobacco Never Used    Patient has failed these meds in past: symbicort Patient is currently controlled on the following medications: trelegy ellipta daily  Using maintenance inhaler regularly? No Frequency of rescue inhaler use:  never  We discussed:  proper inhaler technique. Patient reports Trelegy is working better than Symbicort did. She is entering the coverage gap each year. We  discussed PAP for Trelegy. Pharmacist to begin working on it.   Plan  Continue current medications   Health Maintenance   Patient is currently controlled on the following medications:   Loratadine - allergies Multivitamin - [facility] Miralax - constipation Calcium - osteoporosis  We discussed:  Continuing current medications and considering treatment for osteoporosis. Patient had a Dexa scan indicating osteoporosis. Pharmacist coordinating with PCP to get an order for alendronate.   Plan  Continue current medications  Vaccines   Reviewed and discussed patient's vaccination history. Has had both  COVID vaccines. Does take a flu shot annually. Has had a pneumonia vaccine.    Plan  Recommended patient receive flu vaccine in office.   Medication Management   Pt uses [facility] for all medications Uses weekly pill box Pt endorses good compliance.  We discussed: Patient taking medication and using weekly pill box. Patient indicates good compliance.   Plan Continue current medications as prescribed.   Follow up: 1 month

## 2019-11-18 ENCOUNTER — Ambulatory Visit: Payer: PPO

## 2019-11-18 ENCOUNTER — Other Ambulatory Visit: Payer: Self-pay

## 2019-11-18 DIAGNOSIS — I110 Hypertensive heart disease with heart failure: Secondary | ICD-10-CM

## 2019-11-18 DIAGNOSIS — E782 Mixed hyperlipidemia: Secondary | ICD-10-CM

## 2019-11-18 NOTE — Patient Instructions (Signed)
Visit Information  Thank you for your time discussing your medications. I look forward to working with you to achieve your health care goals. Below is a summary of what we talked about during our visit.   Goals Addressed            This Visit's Progress   . Pharmacy Care Plan - HLD       CARE PLAN ENTRY (see longitudinal plan of care for additional care plan information)  Current Barriers:  . Uncontrolled hyperlipidemia, complicated by age and heart failure . Current antihyperlipidemic regimen: atorvastatin 20 mg daily . Previous antihyperlipidemic medications tried n/a . Most recent lipid panel:     Component Value Date/Time   CHOL 135 11/11/2019 0908   TRIG 91 11/11/2019 0908   HDL 36 (L) 11/11/2019 0908   CHOLHDL 3.8 11/11/2019 0908   LDLCALC 82 11/11/2019 0908 .   Marland Kitchen ASCVD risk enhancing conditions: age >49, DM, HTN, CKD, CHF, current smoker . 10-year ASCVD risk score: age limits calculation  Pharmacist Clinical Goal(s):  Marland Kitchen Over the next 30 days, patient will work with PharmD and providers towards optimized antihyperlipidemic therapy  Interventions: . Comprehensive medication review performed; medication list updated in electronic medical record.  Bertram Savin care team collaboration (see longitudinal plan of care) . Recommend patient resume regular exercise classes when they begin again.  Patient Self Care Activities:  . Patient will focus on medication adherence by continuing to use weekly pill box.  Initial goal documentation     . Pharmacy Care Plan BP       CARE PLAN ENTRY (see longitudinal plan of care for additional care plan information)  Current Barriers:  . Uncontrolled hypertension, complicated by Afib . Current antihypertensive regimen: carvedilol 3.125 bid . Previous antihypertensives tried: amlodipine, Entresto . Last practice recorded BP readings:  BP Readings from Last 3 Encounters:  11/11/19 140/90  09/27/19 130/78  09/03/18 130/74  .   Marland Kitchen Current home BP readings: around 140/80 mmHg . Most recent eGFR/CrCl: No results found for: EGFR  No components found for: CRCL  Pharmacist Clinical Goal(s):  Marland Kitchen Over the next 30 days, patient will work with PharmD and providers to optimize antihypertensive regimen . Recommend continuing watching sodium in diet for optimal blood pressure control.   Interventions: . Inter-disciplinary care team collaboration (see longitudinal plan of care) . Comprehensive medication review performed; medication list updated in the electronic medical record.  . Patient will take blood pressures three times weekly and record log until next visit with Pharmacist to review.   Patient Self Care Activities:  . Patient will continue to check BP three times weekly , document, and provide at future appointments . Patient will focus on medication adherence by continuing to use weekly pill box.   Initial goal documentation     . Pharmacy Care Plan Eliquis       CARE PLAN ENTRY (see longitudinal plan of care for additional care plan information)  Current Barriers:  . Financial Barriers: patient has [employer] insurance and reports copay for Eliquis is cost prohibitive at this time . Eliquis for anticoagulation for Afib: Patient is taking but is very expensive and she reaches the coverage gap each year.    Pharmacist Clinical Goal(s):  Marland Kitchen Over the next 30 days, patient will work with PharmD and providers to relieve medication access concerns  Interventions: . Comprehensive medication review completed; medication list updated in electronic medical record.  Bertram Savin care team collaboration (see longitudinal  plan of care) . Eliquis by [place]: Patient meets income/out of pocket spend criteria for this medication's patient assistance program. Reviewed application process. Patient will provide proof of income, out of pocket spend report, and will sign application. Will  collaborate with primary care provider Dr. Henrene Pastor for their portion of application. Once completed, will submit to [place] assistance program.  Patient Self Care Activities:  . Patient will provide necessary portions of application   Initial goal documentation        Ms. Stallsmith was given information about Chronic Care Management services today including:  1. CCM service includes personalized support from designated clinical staff supervised by her physician, including individualized plan of care and coordination with other care providers 2. 24/7 contact phone numbers for assistance for urgent and routine care needs. 3. Standard insurance, coinsurance, copays and deductibles apply for chronic care management only during months in which we provide at least 20 minutes of these services. Most insurances cover these services at 100%, however patients may be responsible for any copay, coinsurance and/or deductible if applicable. This service may help you avoid the need for more expensive face-to-face services. 4. Only one practitioner may furnish and bill the service in a calendar month. 5. The patient may stop CCM services at any time (effective at the end of the month) by phone call to the office staff.  Patient agreed to services and verbal consent obtained.   Print copy of patient instructions provided.  Telephone follow up appointment with pharmacy team member scheduled for: 12/16/2019   Sherre Poot, PharmD Clinical Pharmacist [facility] [phone]

## 2019-11-21 ENCOUNTER — Other Ambulatory Visit: Payer: Self-pay

## 2019-11-21 DIAGNOSIS — I484 Atypical atrial flutter: Secondary | ICD-10-CM

## 2019-11-21 DIAGNOSIS — E782 Mixed hyperlipidemia: Secondary | ICD-10-CM

## 2019-11-21 DIAGNOSIS — I110 Hypertensive heart disease with heart failure: Secondary | ICD-10-CM

## 2019-11-21 NOTE — Progress Notes (Signed)
Patient had an appointment on 11/18/2019 with Sherre Poot PharmD.

## 2019-11-22 ENCOUNTER — Telehealth: Payer: Self-pay

## 2019-11-22 NOTE — Telephone Encounter (Signed)
Give appointment to discuss lp

## 2019-11-22 NOTE — Telephone Encounter (Signed)
Janifer Scantlebury called in and left a message stating that patient used to be prescribed anti anxiety medication and he believes she needs this again. I did not see a hippa form for her filled out stating we can speak to [employer].

## 2019-11-26 ENCOUNTER — Ambulatory Visit (INDEPENDENT_AMBULATORY_CARE_PROVIDER_SITE_OTHER): Payer: PPO | Admitting: Legal Medicine

## 2019-11-26 ENCOUNTER — Other Ambulatory Visit: Payer: Self-pay

## 2019-11-26 ENCOUNTER — Encounter: Payer: Self-pay | Admitting: Legal Medicine

## 2019-11-26 VITALS — BP 114/70 | HR 61 | Temp 97.7°F | Ht 65.0 in | Wt 178.0 lb

## 2019-11-26 DIAGNOSIS — F411 Generalized anxiety disorder: Secondary | ICD-10-CM | POA: Insufficient documentation

## 2019-11-26 DIAGNOSIS — H00019 Hordeolum externum unspecified eye, unspecified eyelid: Secondary | ICD-10-CM | POA: Diagnosis not present

## 2019-11-26 MED ORDER — ERYTHROMYCIN 5 MG/GM OP OINT: 1.0000 "application " | TOPICAL_OINTMENT | Freq: Every day | OPHTHALMIC | 2 refills | Status: DC

## 2019-11-26 MED ORDER — ALPRAZOLAM 0.25 MG PO TABS: 0.2500 mg | ORAL_TABLET | Freq: Two times a day (BID) | ORAL | 3 refills | Status: DC | PRN

## 2019-11-26 NOTE — Assessment & Plan Note (Signed)
External hordeolum right eye upper lid, the left eye seem healed.  Use ilotycin at night and use warm compresses bid.

## 2019-11-26 NOTE — Assessment & Plan Note (Signed)
Patient continues to be nervous and obsesses on things, family tell me it is a problem.  I will start low dose alprazolam

## 2019-11-26 NOTE — Progress Notes (Signed)
Established Patient Office Visit  Subjective:  Patient ID: Stephanie Douglas, female    DOB: 01-May-1934  Age: 86 y.o. MRN: [id number]  CC:  Chief Complaint  Patient presents with  . Anxiety  . Stye    HPI Stephanie Douglas presents for stye and anxiety worsening lately.  She has infection both eyes for one week.  Anxiety problems.  No behavioral problems, no chest pain. She obsesses over things and does not sleep well.  Past Medical History:  Diagnosis Date  . COPD (chronic obstructive pulmonary disease) ([place])   . Depression   . HTN (hypertension)   . Hyperlipidemia   . PONV (postoperative nausea and vomiting)     Past Surgical History:  Procedure Laterality Date  . ABDOMINAL HYSTERECTOMY    . CARDIOVERSION N/A 06/13/2017   Procedure: CARDIOVERSION;  Surgeon: Lelon Perla, MD;  Location: [facility] ENDOSCOPY;  Service: Cardiovascular;  Laterality: N/A;  . CARDIOVERSION N/A 07/07/2017   Procedure: CARDIOVERSION;  Surgeon: Sanda Klein, MD;  Location: [facility] ENDOSCOPY;  Service: Cardiovascular;  Laterality: N/A;  . TEE WITHOUT CARDIOVERSION N/A 06/13/2017   Procedure: TRANSESOPHAGEAL ECHOCARDIOGRAM (TEE);  Surgeon: Lelon Perla, MD;  Location: [facility] ENDOSCOPY;  Service: Cardiovascular;  Laterality: N/A;    Family History  Problem Relation Age of Onset  . CAD Mother   . Cardiomyopathy Mother   . Diabetes Mother   . Asthma Father   . Breast cancer Sister   . Diabetes Brother   . Hypertension Brother   . AAA (abdominal aortic aneurysm) Sister     Social History   Socioeconomic History  . Marital status: Widowed    Spouse name: Not on file  . Number of children: Not on file  . Years of education: Not on file  . Highest education level: Not on file  Occupational History  . Not on file  Tobacco Use  . Smoking status: Never Smoker  . Smokeless tobacco: Never Used  Substance and Sexual Activity  . Alcohol use: No  . Drug use: No  . Sexual activity: Not  Currently  Other Topics Concern  . Not on file  Social History Narrative  . Not on file   Social Determinants of Health   Financial Resource Strain:   . Difficulty of Paying Living Expenses:   Food Insecurity:   . Worried About Charity fundraiser in the Last Year:   . Arboriculturist in the Last Year:   Transportation Needs: No Transportation Needs  . Lack of Transportation (Medical): No  . Lack of Transportation (Non-Medical): No  Physical Activity: Insufficiently Active  . Days of Exercise per Week: 5 days  . Minutes of Exercise per Session: 10 min  Stress:   . Feeling of Stress :   Social Connections:   . Frequency of Communication with Friends and Family:   . Frequency of Social Gatherings with Friends and Family:   . Attends Religious Services:   . Active Member of Clubs or Organizations:   . Attends Archivist Meetings:   Marland Kitchen Marital Status:   Intimate Partner Violence:   . Fear of Current or Ex-Partner:   . Emotionally Abused:   Marland Kitchen Physically Abused:   . Sexually Abused:     Outpatient Medications Prior to Visit  Medication Sig Dispense Refill  . Acetaminophen (TYLENOL PO) Take 500 mg by mouth at bedtime.     Marland Kitchen amiodarone (PACERONE) 200 MG tablet TAKE 1 TABLET BY MOUTH EVERY  DAY 90 tablet 1  . apixaban (ELIQUIS) 5 MG TABS tablet Take 1 tablet (5 mg total) by mouth 2 (two) times daily. 180 tablet 2  . atorvastatin (LIPITOR) 20 MG tablet Take 1 tablet (20 mg total) by mouth daily. 90 tablet 2  . carvedilol (COREG) 3.125 MG tablet TAKE 1 TABLET TWICE DAILY WITH MEALS. 180 tablet 1  . Fluticasone-Umeclidin-Vilant (TRELEGY ELLIPTA) 100-62.5-25 MCG/INH AEPB Inhale 1 Inhaler into the lungs daily as needed (One Inhalation once Daily). 60 each 6  . furosemide (LASIX) 20 MG tablet Take 1 tablet (20 mg total) by mouth 3 (three) times a week. Take any day you weigh 173 lbs or less 90 tablet 3  . loratadine (CLARITIN) 10 MG tablet Take 10 mg daily as needed by mouth for  allergies.    . Multiple Vitamin (MULTI VITAMIN) TABS Take 1 tablet by mouth daily at 2 PM. 30 tablet   . polyethylene glycol (MIRALAX / GLYCOLAX) packet Take 17 g by mouth daily.     Marland Kitchen CALCIUM ASCORBATE PO Take daily by mouth.     No facility-administered medications prior to visit.    No Known Allergies  ROS Review of Systems  Constitutional: Negative.   HENT: Negative.   Eyes: Negative.   Respiratory: Negative.   Cardiovascular: Negative.   Gastrointestinal: Negative.   Endocrine: Negative.   Genitourinary: Negative.   Musculoskeletal: Negative.   Skin: Negative.   Neurological: Negative.   Psychiatric/Behavioral: Positive for agitation.      Objective:    Physical Exam  Constitutional: She appears well-developed and well-nourished.  HENT:  Head: Normocephalic and atraumatic.  Right Ear: External ear normal.  Left Ear: External ear normal.  Nose: Nose normal.  Mouth/Throat: Oropharynx is clear and moist.  Eyes:  Eyes red with stye present  Cardiovascular: Regular rhythm, normal heart sounds and intact distal pulses.  Pulmonary/Chest: Breath sounds normal.  Musculoskeletal:     Cervical back: Normal range of motion and neck supple.  Psychiatric:  anxious  Vitals reviewed.   BP 114/70   Pulse 61   Temp 97.7 F (36.5 C)   Ht 5\' 5"  (1.651 m)   Wt 178 lb (80.7 kg)   SpO2 98%   BMI 29.62 kg/m  Wt Readings from Last 3 Encounters:  11/26/19 178 lb (80.7 kg)  11/11/19 180 lb (81.6 kg)  09/27/19 183 lb (83 kg)     Health Maintenance Due  Topic Date Due  . COVID-19 Vaccine (1) Never done  . TETANUS/TDAP  Never done  . DEXA SCAN  Never done  . PNA vac Low Risk Adult (1 of 2 - PCV13) Never done    There are no preventive care reminders to display for this patient.  Lab Results  Component Value Date   TSH 3.310 09/27/2019   Lab Results  Component Value Date   WBC 7.8 11/11/2019   HGB 14.0 11/11/2019   HCT 41.4 11/11/2019   MCV 97 11/11/2019    PLT 147 (L) 11/11/2019   Lab Results  Component Value Date   NA 141 11/11/2019   K 4.6 11/11/2019   CO2 23 11/11/2019   GLUCOSE 95 11/11/2019   BUN 15 11/11/2019   CREATININE 0.86 11/11/2019   BILITOT 0.5 11/11/2019   ALKPHOS 95 11/11/2019   AST 33 11/11/2019   ALT 32 11/11/2019   PROT 7.1 11/11/2019   ALBUMIN 4.0 11/11/2019   CALCIUM 9.5 11/11/2019   ANIONGAP 10 07/23/2017   Lab Results  Component Value Date   CHOL 135 11/11/2019   Lab Results  Component Value Date   HDL 36 (L) 11/11/2019   Lab Results  Component Value Date   LDLCALC 82 11/11/2019   Lab Results  Component Value Date   TRIG 91 11/11/2019   Lab Results  Component Value Date   CHOLHDL 3.8 11/11/2019   No results found for: HGBA1C    Assessment & Plan:   Problem List Items Addressed This Visit      Other   GAD (generalized anxiety disorder) - Primary    Patient continues to be nervous and obsesses on things, family tell me it is a problem.  I will start low dose alprazolam      Relevant Medications   ALPRAZolam (XANAX) 0.25 MG tablet   Stye    External hordeolum right eye upper lid, the left eye seem healed.  Use ilotycin at night and use warm compresses bid.         Meds ordered this encounter  Medications  . ALPRAZolam (XANAX) 0.25 MG tablet    Sig: Take 1 tablet (0.25 mg total) by mouth 2 (two) times daily as needed for anxiety.    Dispense:  60 tablet    Refill:  3  . erythromycin ophthalmic ointment    Sig: Place 1 application into both eyes at bedtime.    Dispense:  3.5 g    Refill:  2    Follow-up: Return if symptoms worsen or fail to improve, for next chronic visit.    [name], MD

## 2019-12-16 ENCOUNTER — Ambulatory Visit: Payer: PPO

## 2019-12-16 DIAGNOSIS — E782 Mixed hyperlipidemia: Secondary | ICD-10-CM

## 2019-12-16 DIAGNOSIS — I110 Hypertensive heart disease with heart failure: Secondary | ICD-10-CM

## 2019-12-16 NOTE — Chronic Care Management (AMB) (Signed)
Chronic Care Management Pharmacy  Name: Stephanie Douglas  MRN: [id number] DOB: [birth year]  Chief Complaint/ HPI  Stephanie Douglas,  86 y.o. , female presents for their Initial CCM visit with the clinical pharmacist via telephone due to COVID-19 Pandemic.  PCP : Lillard Anes, MD  Their chronic conditions include: Atrial flutter, Afib, COPD, Hypertensive Heart disease with Heart failure, Combined systolic and diastolic heart failure, HLD.  Office Visits: 11/26/2019 - seen for increased anxiety and stye on eye. Patient started on antibiotic eye ointment and alprazolam.  11/11/2019 - Platelets 147 slightly low, rest of CBC normal, kidney tests stable, one liver test slightly up, Cholesterol normal 05/27/2019 - Routine visit. Labs drawn. Eliquis refilled. GFR 55, Lipids good with low HDL. BNP good. Dexa scan revealed osteoporosis - needs fosamax or Prolia per Dr. Henrene Pastor.   Consult Visit: 09/27/2019 - Dr. Bettina Gavia visit. No changes. Noted that thyroid and CMP need to be checked q6 months. Also noted that patient struggles to afford inhaler.  07/05/2019 - dry eyes seen by Dr. Pamelia Hoit.  06/11/2019 - [facility]. Skin cysts and melanocytic nevi.   Medications: Outpatient Encounter Medications as of 12/16/2019  Medication Sig  . Acetaminophen (TYLENOL PO) Take 500 mg by mouth at bedtime.   . ALPRAZolam (XANAX) 0.25 MG tablet Take 1 tablet (0.25 mg total) by mouth 2 (two) times daily as needed for anxiety. (Patient taking differently: Take 0.125 mg by mouth 2 (two) times daily as needed for anxiety. )  . amiodarone (PACERONE) 200 MG tablet TAKE 1 TABLET BY MOUTH EVERY DAY  . apixaban (ELIQUIS) 5 MG TABS tablet Take 1 tablet (5 mg total) by mouth 2 (two) times daily.  Marland Kitchen atorvastatin (LIPITOR) 20 MG tablet Take 1 tablet (20 mg total) by mouth daily.  . carvedilol (COREG) 3.125 MG tablet TAKE 1 TABLET TWICE DAILY WITH MEALS.  Marland Kitchen Fluticasone-Umeclidin-Vilant (TRELEGY ELLIPTA)  100-62.5-25 MCG/INH AEPB Inhale 1 Inhaler into the lungs daily as needed (One Inhalation once Daily).  . furosemide (LASIX) 20 MG tablet Take 1 tablet (20 mg total) by mouth 3 (three) times a week. Take any day you weigh 173 lbs or less  . loratadine (CLARITIN) 10 MG tablet Take 10 mg daily as needed by mouth for allergies.  . Multiple Vitamin (MULTI VITAMIN) TABS Take 1 tablet by mouth daily at 2 PM.  . polyethylene glycol (MIRALAX / GLYCOLAX) packet Take 17 g by mouth daily.   Marland Kitchen erythromycin ophthalmic ointment Place 1 application into both eyes at bedtime. (Patient not taking: Reported on 12/16/2019)   No facility-administered encounter medications on file as of 12/16/2019.   No Known Allergies  SDOH Screenings   Alcohol Screen:   . Last Alcohol Screening Score (AUDIT):   Depression (PHQ2-9): Medium Risk  . PHQ-2 Score: 11  Financial Resource Strain:   . Difficulty of Paying Living Expenses:   Food Insecurity:   . Worried About Charity fundraiser in the Last Year:   . [employer] of Food in the Last Year:   Housing: [place]   . Last Housing Risk Score: 0  Physical Activity: Insufficiently Active  . Days of Exercise per Week: 5 days  . Minutes of Exercise per Session: 10 min  Social Connections:   . Frequency of Communication with Friends and Family:   . Frequency of Social Gatherings with Friends and Family:   . Attends Religious Services:   . Active Member of Clubs or Organizations:   .  Attends Archivist Meetings:   Marland Kitchen Marital Status:   Stress:   . Feeling of Stress :   Tobacco Use: Low Risk   . Smoking Tobacco Use: Never Smoker  . Smokeless Tobacco Use: Never Used  Transportation Needs: No Transportation Needs  . Lack of Transportation (Medical): No  . Lack of Transportation (Non-Medical): No     Current Diagnosis/Assessment:  Goals Addressed            This Visit's Progress   . Pharmacy Care Plan BP       CARE PLAN ENTRY (see longitudinal plan of  care for additional care plan information)  Current Barriers:  . Uncontrolled hypertension, complicated by Afib . Current antihypertensive regimen: carvedilol 3.125 bid . Previous antihypertensives tried: amlodipine, Entresto . Last practice recorded BP readings:  BP Readings from Last 3 Encounters:  11/11/19 140/90  09/27/19 130/78  09/03/18 130/74 .   Marland Kitchen Current home BP readings: around 140/80 mmHg . Most recent eGFR/CrCl: No results found for: EGFR  No components found for: CRCL  Pharmacist Clinical Goal(s):  Marland Kitchen Over the next 30 days, patient will work with PharmD and providers to optimize antihypertensive regimen . Recommend continuing watching sodium in diet for optimal blood pressure control.   Interventions: . Inter-disciplinary care team collaboration (see longitudinal plan of care) . Comprehensive medication review performed; medication list updated in the electronic medical record.  . Patient will take blood pressures three times weekly and record log until next visit with Pharmacist to review.   Patient Self Care Activities:  . Patient will continue to check BP three times weekly , document, and provide at future appointments . Patient will focus on medication adherence by continuing to use weekly pill box.   Please see past updates related to this goal by clicking on the "Past Updates" button in the selected goal      . [place] (see longitudinal plan of care for additional care plan information)  Current Barriers:  . Financial Barriers: patient has Health Team [employer] and reports copay for Eliquis is cost prohibitive at this time . Eliquis for anticoagulation for Afib: Patient is taking but is very expensive and she reaches the coverage gap each year.    Pharmacist Clinical Goal(s):  Marland Kitchen Over the next 30 days, patient will work with PharmD and providers to relieve medication access  concerns  Interventions: . Comprehensive medication review completed; medication list updated in electronic medical record.  Bertram Savin care team collaboration (see longitudinal plan of care) . Eliquis by [place]: Patient meets income/out of pocket spend criteria for this medication's patient assistance program. Reviewed application process. Patient will provide proof of income, out of pocket spend report, and will sign application. Will collaborate with primary care provider Dr. Henrene Pastor for their portion of application. Once completed, will submit to [place] assistance program.  Patient Self Care Activities:  . Patient will provide necessary portions of application  . Patient has not yet met out of pocket expenses for Eliquis to be covered via Pt Assistance.  Please see past updates related to this goal by clicking on the "Past Updates" button in the selected goal         Heart Failure - managed by Dr. Bettina Gavia   Type: Hypertensive Heart disease with heart failure  Last ejection fraction: 35-40%  Patient has failed these meds in past: amlodipine, entresto Patient is  currently controlled on the following medications: carvedilol 3.125 mg bid, furosemide 20 mg three times weekly  We discussed weighing daily; if you gain more than 3 pounds in one day or 5 pounds in one week call your doctor. Weight was 165 lbs today. Eats crackers without salt, yogurt and bananas. Loves a good breakfast - [place] bacon, egg, toast. Used to exercise at a 45 minute class two times a week before COVID in [place]. Does some light exercise at home during COVID - tries to do a little each day. Patient has been drinking ensure for lunch now. She eats a good meal at [employer].   Plan  Continue current medications   Hyperlipidemia   Lipid Panel     Component Value Date/Time   CHOL 135 11/11/2019 0908   TRIG 91 11/11/2019 0908   HDL 36 (L) 11/11/2019 0908   CHOLHDL 3.8  11/11/2019 0908   LDLCALC 82 11/11/2019 0908   LABVLDL 17 11/11/2019 0908     The ASCVD Risk score (Goff DC Jr., et al., 2013) failed to calculate for the following reasons:   The 2013 ASCVD risk score is only valid for ages 24 to 24   Patient has failed these meds in past: fish oil Patient is currently controlled on the following medications: atorvastatin 20 mg daily  We discussed:  diet and exercise extensively. Patient continues to do push ups at home and eats a healthy diet.   Plan  Continue current medications   COPD / Asthma / Tobacco   Eosinophil count:  No results found for: EOSPCT%                               Eos (Absolute):  Lab Results  Component Value Date/Time   EOSABS 0.2 11/11/2019 09:08 AM    Tobacco Status:  Social History   Tobacco Use  Smoking Status Never Smoker  Smokeless Tobacco Never Used    Patient has failed these meds in past: symbicort Patient is currently controlled on the following medications: trelegy ellipta daily  Using maintenance inhaler regularly? No Frequency of rescue inhaler use:  never  We discussed:  proper inhaler technique. Patient reports Trelegy is working better than Symbicort did. She is entering the coverage gap each year. We discussed PAP for Trelegy but she has not met out of pocket expense yet. Pharmacist to begin working on it. Trelegy is quite expensive and she is nearing the doughnut hole.   Plan  Continue current medications   Anxiety   Patient has failed these meds in past: n/a Patient is currently controlled on the following medications: Alprazolam 0.25 mg - 1/2 tablet bid prn.   We discussed: Has had some recent deaths in family. She was exhausted after funeral, burial and visiting a sick family member. Has had a son commit suicide 25 years ago due to autism. Her husband died at age 12. Whole dose of alprazolam was too much for her. Gave her nightmares so she cut in half. Is trying that to see if symptoms  will be controlled. She indicates her thoughts have been weighing on her more lately.   Plan  Continue current medications   Medication Management   Pt uses [facility] pharmacy for all medications Uses weekly pill box Pt endorses good compliance.  We discussed: Patient taking medication and using weekly pill box. Patient indicates good compliance.   Plan Continue current medications as prescribed.   Follow  up: 1 month

## 2019-12-16 NOTE — Patient Instructions (Signed)
Visit Information  Goals Addressed            This Visit's Progress   . Pharmacy Care Plan - HLD       CARE PLAN ENTRY (see longitudinal plan of care for additional care plan information)  Current Barriers:  . Uncontrolled hyperlipidemia, complicated by age and heart failure . Current antihyperlipidemic regimen: atorvastatin 20 mg daily . Previous antihyperlipidemic medications tried n/a . Most recent lipid panel:     Component Value Date/Time   CHOL 135 11/11/2019 0908   TRIG 91 11/11/2019 0908   HDL 36 (L) 11/11/2019 0908   CHOLHDL 3.8 11/11/2019 0908   LDLCALC 82 11/11/2019 0908 .   Marland Kitchen ASCVD risk enhancing conditions: age >52, DM, HTN, CKD, CHF, current smoker . 10-year ASCVD risk score: age limits calculation  Pharmacist Clinical Goal(s):  Marland Kitchen Over the next 30 days, patient will work with PharmD and providers towards optimized antihyperlipidemic therapy  Interventions: . Comprehensive medication review performed; medication list updated in electronic medical record.  Bertram Savin care team collaboration (see longitudinal plan of care) . Recommend patient resume regular exercise classes when they begin again.  Patient Self Care Activities:  . Patient will focus on medication adherence by continuing to use weekly pill box.  Please see past updates related to this goal by clicking on the "Past Updates" button in the selected goal      . Pharmacy Care Plan BP       CARE PLAN ENTRY (see longitudinal plan of care for additional care plan information)  Current Barriers:  . Uncontrolled hypertension, complicated by Afib . Current antihypertensive regimen: carvedilol 3.125 bid . Previous antihypertensives tried: amlodipine, Entresto . Last practice recorded BP readings:  BP Readings from Last 3 Encounters:  11/11/19 140/90  09/27/19 130/78  09/03/18 130/74 .   Marland Kitchen Current home BP readings: around 140/80 mmHg . Most recent eGFR/CrCl: No results found for: EGFR   No components found for: CRCL  Pharmacist Clinical Goal(s):  Marland Kitchen Over the next 30 days, patient will work with PharmD and providers to optimize antihypertensive regimen . Recommend continuing watching sodium in diet for optimal blood pressure control.   Interventions: . Inter-disciplinary care team collaboration (see longitudinal plan of care) . Comprehensive medication review performed; medication list updated in the electronic medical record.  . Patient will take blood pressures three times weekly and record log until next visit with Pharmacist to review.   Patient Self Care Activities:  . Patient will continue to check BP three times weekly , document, and provide at future appointments . Patient will focus on medication adherence by continuing to use weekly pill box.   Please see past updates related to this goal by clicking on the "Past Updates" button in the selected goal      . [place] (see longitudinal plan of care for additional care plan information)  Current Barriers:  . Financial Barriers: patient has Health Team [employer] and reports copay for Eliquis is cost prohibitive at this time . Eliquis for anticoagulation for Afib: Patient is taking but is very expensive and she reaches the coverage gap each year.    Pharmacist Clinical Goal(s):  Marland Kitchen Over the next 30 days, patient will work with PharmD and providers to relieve medication access concerns  Interventions: . Comprehensive medication review completed; medication list updated in electronic medical record.  Bertram Savin care team collaboration (see longitudinal plan  of care) . Eliquis by [place]: Patient meets income/out of pocket spend criteria for this medication's patient assistance program. Reviewed application process. Patient will provide proof of income, out of pocket spend report, and will sign application. Will collaborate with primary care  provider Dr. Henrene Pastor for their portion of application. Once completed, will submit to [place] assistance program.  Patient Self Care Activities:  . Patient will provide necessary portions of application  . Patient has not yet met out of pocket expenses for Eliquis to be covered via Pt Assistance.  Please see past updates related to this goal by clicking on the "Past Updates" button in the selected goal         The patient verbalized understanding of instructions provided today and declined a print copy of patient instruction materials.   Telephone follow up appointment with pharmacy team member scheduled for: June 14. 2021  Sherre Poot, PharmD, [facility] Clinical Pharmacist [facility] [phone] (office) [phone] (mobile)

## 2020-01-06 ENCOUNTER — Other Ambulatory Visit: Payer: Self-pay | Admitting: Cardiology

## 2020-01-13 ENCOUNTER — Ambulatory Visit: Payer: PPO

## 2020-01-13 DIAGNOSIS — I110 Hypertensive heart disease with heart failure: Secondary | ICD-10-CM

## 2020-01-13 DIAGNOSIS — E782 Mixed hyperlipidemia: Secondary | ICD-10-CM

## 2020-01-13 NOTE — Patient Instructions (Signed)
Visit Information  Goals Addressed            This Visit's Progress    COMPLETED: Pharmacy Care Plan - HLD   On track    CARE PLAN ENTRY (see longitudinal plan of care for additional care plan information)  Current Barriers:   Uncontrolled hyperlipidemia, complicated by age and heart failure  Current antihyperlipidemic regimen: atorvastatin 20 mg daily  Previous antihyperlipidemic medications tried n/a  Most recent lipid panel:     Component Value Date/Time   CHOL 135 11/11/2019 0908   TRIG 91 11/11/2019 0908   HDL 36 (L) 11/11/2019 0908   CHOLHDL 3.8 11/11/2019 0908   LDLCALC 82 11/11/2019 0908     ASCVD risk enhancing conditions: age >17, DM, HTN, CKD, CHF, current smoker  10-year ASCVD risk score: age limits calculation  Pharmacist Clinical Goal(s):   Over the next 30 days, patient will work with PharmD and providers towards optimized antihyperlipidemic therapy  Interventions:  Comprehensive medication review performed; medication list updated in electronic medical record.   Inter-disciplinary care team collaboration (see longitudinal plan of care)  Recommend patient resume regular exercise classes when they begin again.  Patient Self Care Activities:   Patient will focus on medication adherence by continuing to use weekly pill box.  Please see past updates related to this goal by clicking on the "Past Updates" button in the selected goal       Pharmacy Care Plan BP   On track    CARE PLAN ENTRY  Current Barriers:   Chronic Disease Management support, education, and care coordination needs related to Hypertension, Hyperlipidemia, and COPD   Hypertension  Pharmacist Clinical Goal(s): o Over the next 90 days, patient will work with PharmD and providers to maintain BP goal <140/90  Current regimen:  o Carvedilol 3.125 mg twice daily o Furosemide 20 mg three times weekly  Interventions: o Keep up the good work checking bp and weight  daily. o Keep up the good work attending exercise class twice a week.   Patient self care activities - Over the next 90 days, patient will: o Check BP daily, document, and provide at future appointments o Ensure daily salt intake < 2300 mg/day  Hyperlipidemia  Pharmacist Clinical Goal(s): o Over the next 90 days, patient will work with PharmD and providers to maintain LDL goal < 100 mg/dL  Current regimen:  o Atorvastatin 20 mg daily  Interventions: o Keep up the good work attending exercise class twice weekly.   Patient self care activities - Over the next 90 days, patient will: o Continue to take medication as prescribed and contact provider with any questions/concerns.   Medication management  Pharmacist Clinical Goal(s): o Over the next 90 days, patient will work with PharmD and providers to maintain optimal medication adherence  Current pharmacy: [facility]  Interventions o Comprehensive medication review performed. o Continue current medication management strategy  Patient self care activities - Over the next 90 days, patient will: o Focus on medication adherence by continuing to use pill box.  o Take medications as prescribed o Report any questions or concerns to PharmD and/or provider(s)  Please see past updates related to this goal by clicking on the "Past Updates" button in the selected goal       COMPLETED: Pharmacy Care Plan Eliquis       CARE PLAN ENTRY (see longitudinal plan of care for additional care plan information)  Current Barriers:   Financial Barriers: patient has [employer]  insurance and reports copay for Eliquis is cost prohibitive at this time  Eliquis for anticoagulation for Afib: Patient is taking but is very expensive and she reaches the coverage gap each year.    Pharmacist Clinical Goal(s):   Over the next 30 days, patient will work with PharmD and providers to relieve medication access  concerns  Interventions:  Comprehensive medication review completed; medication list updated in electronic medical record.   Inter-disciplinary care team collaboration (see longitudinal plan of care)  Eliquis by [place]: Patient meets income/out of pocket spend criteria for this medication's patient assistance program. Reviewed application process. Patient will provide proof of income, out of pocket spend report, and will sign application. Will collaborate with primary care provider Dr. Henrene Pastor for their portion of application. Once completed, will submit to [place] assistance program.  Patient Self Care Activities:   Patient will provide necessary portions of application   Patient has not yet met out of pocket expenses for Eliquis to be covered via Pt Assistance.  Please see past updates related to this goal by clicking on the "Past Updates" button in the selected goal         The patient verbalized understanding of instructions provided today and declined a print copy of patient instruction materials.   Telephone follow up appointment with pharmacy team member scheduled for: 01/2020  Sherre Poot, PharmD, [facility] Clinical Pharmacist [facility] [phone] (office) [phone] (mobile)

## 2020-01-13 NOTE — Chronic Care Management (AMB) (Signed)
Chronic Care Management Pharmacy  Name: Stephanie Douglas  MRN: [id number] DOB: January 31, 1934  Chief Complaint/ HPI  [place],  86 y.o. , female presents for their Follow-Up CCM visit with the clinical pharmacist via telephone due to COVID-19 Pandemic.  PCP : Lillard Anes, MD  Their chronic conditions include: Atrial flutter, Afib, COPD, Hypertensive Heart disease with Heart failure, Combined systolic and diastolic heart failure, HLD.  Office Visits: 11/26/2019 - seen for increased anxiety and stye on eye. Patient started on antibiotic eye ointment and alprazolam.  11/11/2019 - Platelets 147 slightly low, rest of CBC normal, kidney tests stable, one liver test slightly up, Cholesterol normal 05/27/2019 - Routine visit. Labs drawn. Eliquis refilled. GFR 55, Lipids good with low HDL. BNP good. Dexa scan revealed osteoporosis - needs fosamax or Prolia per Dr. Henrene Pastor.   Consult Visit: 09/27/2019 - Dr. Bettina Gavia visit. No changes. Noted that thyroid and CMP need to be checked q6 months. Also noted that patient struggles to afford inhaler.  07/05/2019 - dry eyes seen by Dr. Pamelia Hoit.  06/11/2019 - [place] [facility]. Skin cysts and melanocytic nevi.   Medications: Outpatient Encounter Medications as of 01/13/2020  Medication Sig  . Acetaminophen (TYLENOL PO) Take 500 mg by mouth at bedtime.   . ALPRAZolam (XANAX) 0.25 MG tablet Take 1 tablet (0.25 mg total) by mouth 2 (two) times daily as needed for anxiety. (Patient taking differently: Take 0.125 mg by mouth 2 (two) times daily as needed for anxiety. )  . amiodarone (PACERONE) 200 MG tablet TAKE 1 TABLET BY MOUTH EVERY DAY  . apixaban (ELIQUIS) 5 MG TABS tablet Take 1 tablet (5 mg total) by mouth 2 (two) times daily.  Marland Kitchen atorvastatin (LIPITOR) 20 MG tablet Take 1 tablet (20 mg total) by mouth daily.  . carvedilol (COREG) 3.125 MG tablet TAKE 1 TABLET TWICE DAILY WITH MEALS.  Marland Kitchen erythromycin ophthalmic ointment Place 1  application into both eyes at bedtime. (Patient not taking: Reported on 12/16/2019)  . Fluticasone-Umeclidin-Vilant (TRELEGY ELLIPTA) 100-62.5-25 MCG/INH AEPB Inhale 1 Inhaler into the lungs daily as needed (One Inhalation once Daily).  . furosemide (LASIX) 20 MG tablet Take 1 tablet (20 mg total) by mouth 3 (three) times a week. Take any day you weigh 173 lbs or less  . loratadine (CLARITIN) 10 MG tablet Take 10 mg daily as needed by mouth for allergies.  . Multiple Vitamin (MULTI VITAMIN) TABS Take 1 tablet by mouth daily at 2 PM.  . polyethylene glycol (MIRALAX / GLYCOLAX) packet Take 17 g by mouth daily.    No facility-administered encounter medications on file as of 01/13/2020.   No Known Allergies  SDOH Screenings   Alcohol Screen:   . Last Alcohol Screening Score (AUDIT):   Depression (PHQ2-9): Medium Risk  . PHQ-2 Score: 11  Financial Resource Strain:   . Difficulty of Paying Living Expenses:   Food Insecurity:   . Worried About Charity fundraiser in the Last Year:   . [employer] of Food in the Last Year:   Housing: [place]   . Last Housing Risk Score: 0  Physical Activity: Insufficiently Active  . Days of Exercise per Week: 5 days  . Minutes of Exercise per Session: 10 min  Social Connections:   . Frequency of Communication with Friends and Family:   . Frequency of Social Gatherings with Friends and Family:   . Attends Religious Services:   . Active Member of Clubs or Organizations:   .  Attends Banker Meetings:   Marland Kitchen Marital Status:   Stress:   . Feeling of Stress :   Tobacco Use: Low Risk   . Smoking Tobacco Use: Never Smoker  . Smokeless Tobacco Use: Never Used  Transportation Needs: No Transportation Needs  . Lack of Transportation (Medical): No  . Lack of Transportation (Non-Medical): No     Current Diagnosis/Assessment:  Goals Addressed            This Visit's Progress   . COMPLETED: Pharmacy Care Plan - HLD   On track    CARE PLAN  ENTRY (see longitudinal plan of care for additional care plan information)  Current Barriers:  . Uncontrolled hyperlipidemia, complicated by age and heart failure . Current antihyperlipidemic regimen: atorvastatin 20 mg daily . Previous antihyperlipidemic medications tried n/a . Most recent lipid panel:     Component Value Date/Time   CHOL 135 11/11/2019 0908   TRIG 91 11/11/2019 0908   HDL 36 (L) 11/11/2019 0908   CHOLHDL 3.8 11/11/2019 0908   LDLCALC 82 11/11/2019 0908 .   Marland Kitchen ASCVD risk enhancing conditions: age >46, DM, HTN, CKD, CHF, current smoker . 10-year ASCVD risk score: age limits calculation  Pharmacist Clinical Goal(s):  Marland Kitchen Over the next 30 days, patient will work with PharmD and providers towards optimized antihyperlipidemic therapy  Interventions: . Comprehensive medication review performed; medication list updated in electronic medical record.  Rene Paci care team collaboration (see longitudinal plan of care) . Recommend patient resume regular exercise classes when they begin again.  Patient Self Care Activities:  . Patient will focus on medication adherence by continuing to use weekly pill box.  Please see past updates related to this goal by clicking on the "Past Updates" button in the selected goal      . Pharmacy Care Plan BP       CARE PLAN ENTRY  Current Barriers:  . Chronic Disease Management support, education, and care coordination needs related to Hypertension, Hyperlipidemia, and COPD   Hypertension . Pharmacist Clinical Goal(s): o Over the next 90 days, patient will work with PharmD and providers to maintain BP goal <140/90 . Current regimen:  o Carvedilol 3.125 mg twice daily o Furosemide 20 mg three times weekly . Interventions: o Keep up the good work checking bp and weight daily. o Keep up the good work attending exercise class twice a week.  . Patient self care activities - Over the next 90 days, patient will: o Check BP daily,  document, and provide at future appointments o Ensure daily salt intake < 2300 mg/day  Hyperlipidemia . Pharmacist Clinical Goal(s): o Over the next 90 days, patient will work with PharmD and providers to maintain LDL goal < 100 mg/dL . Current regimen:  o Atorvastatin 20 mg daily . Interventions: o Keep up the good work attending exercise class twice weekly.  . Patient self care activities - Over the next 90 days, patient will: o Continue to take medication as prescribed and contact provider with any questions/concerns.   Medication management . Pharmacist Clinical Goal(s): o Over the next 90 days, patient will work with PharmD and providers to maintain optimal medication adherence . [facility] . Interventions o Comprehensive medication review performed. o Continue current medication management strategy . Patient self care activities - Over the next 90 days, patient will: o Focus on medication adherence by continuing to use pill box.  o Take medications as prescribed o Report any questions or concerns to  PharmD and/or provider(s)  Please see past updates related to this goal by clicking on the "Past Updates" button in the selected goal      . COMPLETED: [place] (see longitudinal plan of care for additional care plan information)  Current Barriers:  . Financial Barriers: patient has [employer] and reports copay for Eliquis is cost prohibitive at this time . Eliquis for anticoagulation for Afib: Patient is taking but is very expensive and she reaches the coverage gap each year.    Pharmacist Clinical Goal(s):  Marland Kitchen Over the next 30 days, patient will work with PharmD and providers to relieve medication access concerns  Interventions: . Comprehensive medication review completed; medication list updated in electronic medical record.  Bertram Savin care team collaboration (see longitudinal plan of  care) . Eliquis by [place]: Patient meets income/out of pocket spend criteria for this medication's patient assistance program. Reviewed application process. Patient will provide proof of income, out of pocket spend report, and will sign application. Will collaborate with primary care provider Dr. Henrene Pastor for their portion of application. Once completed, will submit to [place] assistance program.  Patient Self Care Activities:  . Patient will provide necessary portions of application  . Patient has not yet met out of pocket expenses for Eliquis to be covered via Pt Assistance.  Please see past updates related to this goal by clicking on the "Past Updates" button in the selected goal         Heart Failure - managed by Dr. Bettina Gavia   Type: Hypertensive Heart disease with heart failure  Last ejection fraction: 35-40%  Patient has failed these meds in past: amlodipine, entresto Patient is currently controlled on the following medications: carvedilol 3.125 mg bid, furosemide 20 mg three times weekly  We discussed weighing daily; if you gain more than 3 pounds in one day or 5 pounds in one week call your doctor. Weight was 165 lbs today. Eats crackers without salt, yogurt and bananas. Loves a good breakfast - [place] bacon, egg, toast. Used to exercise at a 45 minute class two times a week before COVID in [place]. Does some light exercise at home during COVID - tries to do a little each day. Patient has been drinking ensure for lunch now. She eats a good meal at [employer].   Update 01/13/2020 - BP today is 143/94 pulse 62 weight 169. She indicates that her weight is stable and bp is doing well. Her exercise class is resumed and she went back. She is enjoying seeing everyone.    Plan  Continue current medications   AFIB - Managed by Dr. Bettina Gavia   Patient is currently rhythm controlled. HR 61 BPM  Patient has failed these meds in past: digoxin 0.125 mg,  metoprolol Patient is currently controlled on the following medications: amiodarone 200 mg daily, eliquis 5 mg bid  We discussed:  diet and exercise extensively.   Update 01/13/2020 - Patient is in medicare coverage gap and Eliquis is cost prohibitive. Provided patient with samples of Eliquis and provided PAP with updated out of pocket expense. PAP approved 01/13/2020. Patient will receive mediation in the upcoming week or two.    Plan  Continue current medications    Hyperlipidemia   Lipid Panel     Component Value Date/Time   CHOL 135 11/11/2019 0908   TRIG 91 11/11/2019 0908   HDL 36 (L) 11/11/2019 4782  CHOLHDL 3.8 11/11/2019 0908   LDLCALC 82 11/11/2019 0908   LABVLDL 17 11/11/2019 0908     The ASCVD Risk score (Goff DC Jr., et al., 2013) failed to calculate for the following reasons:   The 2013 ASCVD risk score is only valid for ages 35 to 46   Patient has failed these meds in past: fish oil Patient is currently controlled on the following medications: atorvastatin 20 mg daily  We discussed:  diet and exercise extensively. Patient continues to do push ups at home and eats a healthy diet.   01/13/2020: Patient has resumed exercise 2 days each week in her regular class.   Plan  Continue current medications   COPD / Asthma / Tobacco   Eosinophil count:  No results found for: EOSPCT%                               Eos (Absolute):  Lab Results  Component Value Date/Time   EOSABS 0.2 11/11/2019 09:08 AM    Tobacco Status:  Social History   Tobacco Use  Smoking Status Never Smoker  Smokeless Tobacco Never Used    Patient has failed these meds in past: symbicort Patient is currently controlled on the following medications: trelegy ellipta daily  Using maintenance inhaler regularly? No Frequency of rescue inhaler use:  never  We discussed:  proper inhaler technique. Patient reports Trelegy is working better than Symbicort did. She is entering the coverage  gap each year. We discussed PAP for Trelegy but she has not met out of pocket expense yet. Pharmacist to begin working on it. Trelegy is quite expensive and she is nearing the doughnut hole.   01/13/2020 - Patient is unable to afford Trelegy due to coverage gap. Patient Assistance for Trelegy has an out of pocket amount set at $600 before approved. Recommend patient trial a sample of Breztri. If symptoms are appropriately managed, Breztri PAP could supply patient with medication to avoid expense during doughnut hole and potentially avoid doughnut hole next year.   Plan  Recommend changing to [facility] once PAP application approved.   Anxiety   Patient has failed these meds in past: n/a Patient is currently controlled on the following medications: Alprazolam 0.25 mg - 1/2 tablet bid prn.   We discussed: Has had some recent deaths in family. She was exhausted after funeral, burial and visiting a sick family member. Has had a son commit suicide 25 years ago due to autism. Her husband died at age 58. Whole dose of alprazolam was too much for her. Gave her nightmares so she cut in half. Is trying that to see if symptoms will be controlled. She indicates her thoughts have been weighing on her more lately.   Update 01/13/2020 - Half tablet of alprazolam is working well.   Plan  Continue current medications   Medication Management   [facility] for [facility] medications Uses weekly pill box Pt endorses good compliance.  We discussed: Patient taking medication and using weekly pill box. Patient indicates good compliance.   Plan Continue current medications as prescribed.   Follow up: 1 month

## 2020-01-21 ENCOUNTER — Telehealth: Payer: Self-pay

## 2020-01-21 NOTE — Telephone Encounter (Cosign Needed)
Patient's Patient Assistance for Breztri approved 01/15/2020 - 07/31/2020. Her first delivery is in transit to arrive 01/24/2020.

## 2020-01-23 ENCOUNTER — Other Ambulatory Visit: Payer: Self-pay

## 2020-02-25 ENCOUNTER — Other Ambulatory Visit: Payer: Self-pay

## 2020-04-03 ENCOUNTER — Other Ambulatory Visit: Payer: Self-pay | Admitting: Cardiology

## 2020-04-03 DIAGNOSIS — Z79899 Other long term (current) drug therapy: Secondary | ICD-10-CM

## 2020-04-20 NOTE — Chronic Care Management (AMB) (Signed)
Chronic Care Management Pharmacy  Name: Stephanie Douglas  MRN: [id number] DOB: 02-Sep-1933  Chief Complaint/ HPI  Stephanie Douglas,  86 y.o. , female presents for their Follow-Up CCM visit with the clinical pharmacist via telephone due to COVID-19 Pandemic.  PCP : Lillard Anes, MD  Their chronic conditions include: Atrial flutter, Afib, COPD, Hypertensive Heart disease with Heart failure, Combined systolic and diastolic heart failure, HLD.  Office Visits: 11/26/2019 - seen for increased anxiety and stye on eye. Patient started on antibiotic eye ointment and alprazolam.  11/11/2019 - Platelets 147 slightly low, rest of CBC normal, kidney tests stable, one liver test slightly up, Cholesterol normal 05/27/2019 - Routine visit. Labs drawn. Eliquis refilled. GFR 55, Lipids good with low HDL. BNP good. Dexa scan revealed osteoporosis - needs fosamax or Prolia per Dr. Henrene Pastor.   Consult Visit: 09/27/2019 - Dr. Bettina Gavia visit. No changes. Noted that thyroid and CMP need to be checked q6 months. Also noted that patient struggles to afford inhaler.  07/05/2019 - dry eyes seen by Dr. Pamelia Hoit.  06/11/2019 - [place] [facility]. Skin cysts and melanocytic nevi.   Medications: Outpatient Encounter Medications as of 04/21/2020  Medication Sig  . Acetaminophen (TYLENOL PO) Take 500 mg by mouth at bedtime.   . ALPRAZolam (XANAX) 0.25 MG tablet Take 1 tablet (0.25 mg total) by mouth 2 (two) times daily as needed for anxiety. (Patient taking differently: Take 0.125 mg by mouth 2 (two) times daily as needed for anxiety. )  . amiodarone (PACERONE) 200 MG tablet TAKE 1 TABLET BY MOUTH EVERY DAY  . apixaban (ELIQUIS) 5 MG TABS tablet Take 1 tablet (5 mg total) by mouth 2 (two) times daily.  Marland Kitchen atorvastatin (LIPITOR) 20 MG tablet Take 1 tablet (20 mg total) by mouth daily.  . Budeson-Glycopyrrol-Formoterol 160-9-4.8 MCG/ACT AERO Inhale 2 puffs into the lungs daily.  . carvedilol (COREG) 3.125 MG  tablet TAKE 1 TABLET TWICE DAILY WITH MEALS.  Marland Kitchen erythromycin ophthalmic ointment Place 1 application into both eyes at bedtime. (Patient not taking: Reported on 12/16/2019)  . furosemide (LASIX) 20 MG tablet Take 1 tablet (20 mg total) by mouth 3 (three) times a week. Take any day you weigh 173 lbs or less  . loratadine (CLARITIN) 10 MG tablet Take 10 mg daily as needed by mouth for allergies.  . Multiple Vitamin (MULTI VITAMIN) TABS Take 1 tablet by mouth daily at 2 PM.  . polyethylene glycol (MIRALAX / GLYCOLAX) packet Take 17 g by mouth daily.    No facility-administered encounter medications on file as of 04/21/2020.   No Known Allergies  SDOH Screenings   Alcohol Screen:   . Last Alcohol Screening Score (AUDIT): Not on file  Depression (PHQ2-9): Medium Risk  . PHQ-2 Score: 11  Financial Resource Strain:   . Difficulty of Paying Living Expenses: Not on file  Food Insecurity:   . Worried About Charity fundraiser in the Last Year: Not on file  . Ran Out of Food in the Last Year: Not on file  Housing: Low Risk   . Last Housing Risk Score: 0  Physical Activity: Insufficiently Active  . Days of Exercise per Week: 5 days  . Minutes of Exercise per Session: 10 min  Social Connections:   . Frequency of Communication with Friends and Family: Not on file  . Frequency of Social Gatherings with Friends and Family: Not on file  . Attends Religious Services: Not on file  . Active Member of  Clubs or Organizations: Not on file  . Attends Archivist Meetings: Not on file  . Marital Status: Not on file  Stress:   . Feeling of Stress : Not on file  Tobacco Use: Low Risk   . Smoking Tobacco Use: Never Smoker  . Smokeless Tobacco Use: Never Used  Transportation Needs: No Transportation Needs  . Lack of Transportation (Medical): No  . Lack of Transportation (Non-Medical): No     Current Diagnosis/Assessment:  Goals Addressed            This Visit's Progress   . Pharmacy  Care Plan BP       CARE PLAN ENTRY  Current Barriers:  . Chronic Disease Management support, education, and care coordination needs related to Hypertension, Hyperlipidemia, and COPD   Hypertension . Pharmacist Clinical Goal(s): o Over the next 90 days, patient will work with PharmD and providers to achieve BP goal <140/90 . Current regimen:  o Carvedilol 3.125 mg twice daily o Furosemide 20 mg three times weekly . Interventions: o Keep up the good work checking bp and weight daily. o Keep up the good work attending exercise class twice a week.  o Reviewed recent blood pressures. Blood Pressure readings have been elevated in the past month ~150s/90s. Patient denies swelling.  . Patient self care activities - Over the next 90 days, patient will: o Check BP daily, document, and provide at future appointments o Ensure daily salt intake < 2300 mg/day  Hyperlipidemia . Pharmacist Clinical Goal(s): o Over the next 90 days, patient will work with PharmD and providers to maintain LDL goal < 100 mg/dL . Current regimen:  o Atorvastatin 20 mg daily . Interventions: o Keep up the good work attending exercise class twice weekly.  . Patient self care activities - Over the next 90 days, patient will: o Continue to take medication as prescribed and contact provider with any questions/concerns.   Medication management . Pharmacist Clinical Goal(s): o Over the next 90 days, patient will work with PharmD and providers to maintain optimal medication adherence . Current pharmacy: [facility] . Interventions o Comprehensive medication review performed. o Continue current medication management strategy . Patient self care activities - Over the next 90 days, patient will: o Focus on medication adherence by continuing to use pill box.  o Take medications as prescribed o Report any questions or concerns to PharmD and/or provider(s)  Please see past updates related to this goal by clicking on the "Past  Updates" button in the selected goal         Heart Failure - managed by Dr. Bettina Gavia   Type: Hypertensive Heart disease with heart failure  Last ejection fraction: 35-40%  Patient has failed these meds in past: amlodipine, entresto Patient is currently controlled on the following medications: carvedilol 3.125 mg bid, furosemide 20 mg three times weekly  We discussed weighing daily; if you gain more than 3 pounds in one day or 5 pounds in one week call your doctor. Weight was 165 lbs today. Eats crackers without salt, yogurt and bananas. Loves a good breakfast - [place] bacon, egg, toast. Used to exercise at a 45 minute class two times a week before COVID in [place]. Does some light exercise at home during COVID - tries to do a little each day. Patient has been drinking ensure for lunch now. She eats a good meal at [employer].   Update 01/13/2020 - BP today is 143/94 pulse 62 weight 169. She indicates that her weight  is stable and bp is doing well. Her exercise class is resumed and she went back. She is enjoying seeing everyone.   04/21/2020 - BP today is 151/84, 154/93, 151/82, 151/93. Pulse 56-58. Weight has increased 5 lbs in the last month (164 - 169). Patient has been exercising twice weekly but is eating out some. Patient denies swelling in feet/hands.    Plan  Continue current medications. Pharmacist discussing increased blood pressure readings with Dr. Henrene Pastor.   AFIB - Managed by Dr. Bettina Gavia   Patient is currently rhythm controlled. HR 61 BPM  Patient has failed these meds in past: digoxin 0.125 mg, metoprolol Patient is currently controlled on the following medications:   amiodarone 200 mg daily  eliquis 5 mg bid  We discussed:  diet and exercise extensively.   Update 01/13/2020 - Patient is in medicare coverage gap and Eliquis is cost prohibitive. Provided patient with samples of Eliquis and provided PAP with updated out of pocket expense. PAP approved 01/13/2020. Patient will  receive mediation in the upcoming week or two.    Update 04/21/2020 - Patient has 30 days of Eliquis remaining. Pharmacist will help coordinate refill of Eliquis through drug company. Patient reports good adherence to regimen.   Plan  Continue current medications    Hyperlipidemia   Lipid Panel     Component Value Date/Time   CHOL 135 11/11/2019 0908   TRIG 91 11/11/2019 0908   HDL 36 (L) 11/11/2019 0908   CHOLHDL 3.8 11/11/2019 0908   LDLCALC 82 11/11/2019 0908   LABVLDL 17 11/11/2019 0908     The ASCVD Risk score (Goff DC Jr., et al., 2013) failed to calculate for the following reasons:   The 2013 ASCVD risk score is only valid for ages 24 to 54   Patient has failed these meds in past: fish oil Patient is currently controlled on the following medications:   atorvastatin 20 mg daily  We discussed:  diet and exercise extensively. Patient continues to do push ups at home and eats a healthy diet.   01/13/2020: Patient has resumed exercise 2 days each week in her regular class.   04/21/2020 - Patient is exercising twice weekly in her church class. Reports continuing to eat healthy options.   Plan  Continue current medications   COPD / Asthma / Tobacco   Eosinophil count:  No results found for: EOSPCT%                               Eos (Absolute):  Lab Results  Component Value Date/Time   EOSABS 0.2 11/11/2019 09:08 AM    Tobacco Status:  Social History   Tobacco Use  Smoking Status Never Smoker  Smokeless Tobacco Never Used    Patient has failed these meds in past: symbicort, trelegy Patient is currently controlled on the following medications:   Breztri 2 puffs twice daily Using maintenance inhaler regularly? No Frequency of rescue inhaler use:  never  We discussed:  proper inhaler technique. Patient reports Trelegy is working better than Symbicort did. She is entering the coverage gap each year. We discussed PAP for Trelegy but she has not met out of  pocket expense yet. Pharmacist to begin working on it. Trelegy is quite expensive and she is nearing the doughnut hole.   01/13/2020 - Patient is unable to afford Trelegy due to coverage gap. Patient Assistance for Trelegy has an out of pocket amount set at $600  before approved. Recommend patient trial a sample of Breztri. If symptoms are appropriately managed, Breztri PAP could supply patient with medication to avoid expense during doughnut hole and potentially avoid doughnut hole next year.  04/21/2020 - Patient states that her breathing is ok. She is exercising, vacuuming her house, cooking. She notices some chest heaviness at times. Overall breathing is well managed with Breztri. Patient received a 3 month supply through the patient assistance program today.   Plan  Recommend continuing Breztri.   Anxiety   Patient has failed these meds in past: n/a Patient is currently controlled on the following medications: Alprazolam 0.25 mg - 1/2 tablet bid prn.   We discussed: Has had some recent deaths in family. She was exhausted after funeral, burial and visiting a sick family member. Has had a son commit suicide 25 years ago due to autism. Her husband died at age 48. Whole dose of alprazolam was too much for her. Gave her nightmares so she cut in half. Is trying that to see if symptoms will be controlled. She indicates her thoughts have been weighing on her more lately.   Update 01/13/2020 - Half tablet of alprazolam is working well.   Plan  Continue current medications   Medication Management   Pt uses [facility] pharmacy for all medications Uses weekly pill box Pt endorses good compliance.  We discussed: Patient taking medication and using weekly pill box. Patient indicates good compliance.   Plan Continue current medications as prescribed.   Follow up: 3 month

## 2020-04-21 ENCOUNTER — Ambulatory Visit: Payer: PPO

## 2020-04-21 ENCOUNTER — Other Ambulatory Visit: Payer: Self-pay

## 2020-04-21 DIAGNOSIS — E782 Mixed hyperlipidemia: Secondary | ICD-10-CM

## 2020-04-21 DIAGNOSIS — I110 Hypertensive heart disease with heart failure: Secondary | ICD-10-CM

## 2020-04-21 NOTE — Progress Notes (Signed)
Patient needs appointment for BP lp

## 2020-04-21 NOTE — Patient Instructions (Signed)
Visit Information  Goals Addressed            This Visit's Progress   . Pharmacy Care Plan BP       CARE PLAN ENTRY  Current Barriers:  . Chronic Disease Management support, education, and care coordination needs related to Hypertension, Hyperlipidemia, and COPD   Hypertension . Pharmacist Clinical Goal(s): o Over the next 90 days, patient will work with PharmD and providers to achieve BP goal <140/90 . Current regimen:  o Carvedilol 3.125 mg twice daily o Furosemide 20 mg three times weekly . Interventions: o Keep up the good work checking bp and weight daily. o Keep up the good work attending exercise class twice a week.  o Reviewed recent blood pressures. Blood Pressure readings have been elevated in the past month ~150s/90s. Patient denies swelling.  . Patient self care activities - Over the next 90 days, patient will: o Check BP daily, document, and provide at future appointments o Ensure daily salt intake < 2300 mg/day  Hyperlipidemia . Pharmacist Clinical Goal(s): o Over the next 90 days, patient will work with PharmD and providers to maintain LDL goal < 100 mg/dL . Current regimen:  o Atorvastatin 20 mg daily . Interventions: o Keep up the good work attending exercise class twice weekly.  . Patient self care activities - Over the next 90 days, patient will: o Continue to take medication as prescribed and contact provider with any questions/concerns.   Medication management . Pharmacist Clinical Goal(s): o Over the next 90 days, patient will work with PharmD and providers to maintain optimal medication adherence . Current pharmacy: [facility] . Interventions o Comprehensive medication review performed. o Continue current medication management strategy . Patient self care activities - Over the next 90 days, patient will: o Focus on medication adherence by continuing to use pill box.  o Take medications as prescribed o Report any questions or concerns to PharmD  and/or provider(s)  Please see past updates related to this goal by clicking on the "Past Updates" button in the selected goal         The patient verbalized understanding of instructions provided today and declined a print copy of patient instruction materials.   Telephone follow up appointment with pharmacy team member scheduled for: 07/2020  Sherre Poot, PharmD, [facility] Clinical Pharmacist [facility] [phone] (office) [phone] (mobile)  Heart-Healthy Eating Plan Many factors influence your heart (coronary) health, including eating and exercise habits. Coronary risk increases with abnormal blood fat (lipid) levels. Heart-healthy meal planning includes limiting unhealthy fats, increasing healthy fats, and making other diet and lifestyle changes. What is my plan? Your health care provider may recommend that you:  Limit your fat intake to _________% or less of your total calories each day.  Limit your saturated fat intake to _________% or less of your total calories each day.  Limit the amount of cholesterol in your diet to less than _________ mg per day. What are tips for following this plan? Cooking Cook foods using methods other than frying. Baking, boiling, grilling, and broiling are all good options. Other ways to reduce fat include:  Removing the skin from poultry.  Removing all visible fats from meats.  Steaming vegetables in water or broth. Meal planning   At meals, imagine dividing your plate into fourths: ? Fill one-half of your plate with vegetables and green salads. ? Fill one-fourth of your plate with whole grains. ? Fill one-fourth of your plate with lean protein foods.  Eat 4-5 servings of vegetables per day. One serving equals 1 cup raw or cooked vegetable, or 2 cups raw leafy greens.  Eat 4-5 servings of fruit per day. One serving equals 1 medium whole fruit,  cup dried fruit,  cup fresh, frozen, or canned fruit, or  cup 100%  fruit juice.  Eat more foods that contain soluble fiber. Examples include apples, broccoli, carrots, beans, peas, and barley. Aim to get 25-30 g of fiber per day.  Increase your consumption of legumes, nuts, and seeds to 4-5 servings per week. One serving of dried beans or legumes equals  cup cooked, 1 serving of nuts is  cup, and 1 serving of seeds equals 1 tablespoon. Fats  Choose healthy fats more often. Choose monounsaturated and polyunsaturated fats, such as olive and canola oils, flaxseeds, walnuts, almonds, and seeds.  Eat more omega-3 fats. Choose salmon, mackerel, sardines, tuna, flaxseed oil, and ground flaxseeds. Aim to eat fish at least 2 times each week.  Check food labels carefully to identify foods with trans fats or high amounts of saturated fat.  Limit saturated fats. These are found in animal products, such as meats, butter, and cream. Plant sources of saturated fats include palm oil, palm kernel oil, and coconut oil.  Avoid foods with partially hydrogenated oils in them. These contain trans fats. Examples are stick margarine, some tub margarines, cookies, crackers, and other baked goods.  Avoid fried foods. General information  Eat more home-cooked food and less restaurant, buffet, and fast food.  Limit or avoid alcohol.  Limit foods that are high in starch and sugar.  Lose weight if you are overweight. Losing just 5-10% of your body weight can help your overall health and prevent diseases such as diabetes and heart disease.  Monitor your salt (sodium) intake, especially if you have high blood pressure. Talk with your health care provider about your sodium intake.  Try to incorporate more vegetarian meals weekly. What foods can I eat? Fruits All fresh, canned (in natural juice), or frozen fruits. Vegetables Fresh or frozen vegetables (raw, steamed, roasted, or grilled). Green salads. Grains Most grains. Choose whole wheat and whole grains most of the time.  Rice and pasta, including Colyn Miron rice and pastas made with whole wheat. Meats and other proteins Lean, well-trimmed beef, veal, pork, and lamb. Chicken and [place] without skin. All fish and shellfish. Wild duck, rabbit, pheasant, and venison. Egg whites or low-cholesterol egg substitutes. Dried beans, peas, lentils, and tofu. Seeds and most nuts. Dairy Low-fat or nonfat cheeses, including ricotta and mozzarella. Skim or 1% milk (liquid, powdered, or evaporated). Buttermilk made with low-fat milk. Nonfat or low-fat yogurt. Fats and oils Non-hydrogenated (trans-free) margarines. Vegetable oils, including soybean, sesame, sunflower, olive, peanut, safflower, corn, canola, and cottonseed. Salad dressings or mayonnaise made with a vegetable oil. Beverages Water (mineral or sparkling). Coffee and tea. Diet carbonated beverages. Sweets and desserts Sherbet, gelatin, and fruit ice. Small amounts of dark chocolate. Limit all sweets and desserts. Seasonings and condiments All seasonings and condiments. The items listed above may not be a complete list of foods and beverages you can eat. Contact a dietitian for more options. What foods are not recommended? Fruits Canned fruit in heavy syrup. Fruit in cream or butter sauce. Fried fruit. Limit coconut. Vegetables Vegetables cooked in cheese, cream, or butter sauce. Fried vegetables. Grains Breads made with saturated or trans fats, oils, or whole milk. Croissants. Sweet rolls. Donuts. High-fat crackers, such as cheese crackers. Meats and other proteins  Fatty meats, such as hot dogs, ribs, sausage, bacon, rib-eye roast or steak. High-fat deli meats, such as salami and bologna. Caviar. Domestic duck and goose. Organ meats, such as liver. Dairy Cream, sour cream, cream cheese, and creamed cottage cheese. Whole milk cheeses. Whole or 2% milk (liquid, evaporated, or condensed). Whole buttermilk. Cream sauce or high-fat cheese sauce. Whole-milk yogurt. Fats and  oils Meat fat, or shortening. Cocoa butter, hydrogenated oils, palm oil, coconut oil, palm kernel oil. Solid fats and shortenings, including bacon fat, salt pork, lard, and butter. Nondairy cream substitutes. Salad dressings with cheese or sour cream. Beverages Regular sodas and any drinks with added sugar. Sweets and desserts Frosting. Pudding. Cookies. Cakes. Pies. Milk chocolate or white chocolate. Buttered syrups. Full-fat ice cream or ice cream drinks. The items listed above may not be a complete list of foods and beverages to avoid. Contact a dietitian for more information. Summary  Heart-healthy meal planning includes limiting unhealthy fats, increasing healthy fats, and making other diet and lifestyle changes.  Lose weight if you are overweight. Losing just 5-10% of your body weight can help your overall health and prevent diseases such as diabetes and heart disease.  Focus on eating a balance of foods, including fruits and vegetables, low-fat or nonfat dairy, lean protein, nuts and legumes, whole grains, and heart-healthy oils and fats. This information is not intended to replace advice given to you by your health care provider. Make sure you discuss any questions you have with your health care provider. Document Revised: 08/25/2017 Document Reviewed: 08/25/2017 Elsevier Patient Education  2020 [employer].

## 2020-04-22 NOTE — Progress Notes (Signed)
I left message on voicemail to call me back and set up an appointment.

## 2020-05-18 ENCOUNTER — Telehealth: Payer: Self-pay

## 2020-05-18 NOTE — Chronic Care Management (AMB) (Signed)
Chronic Care Management Pharmacy Assistant   Name: Stephanie Douglas  MRN: [id number] DOB: January 02, 1934  Reason for Encounter: Disease State/Hypertension and Lipids Adherence Call.   Patient Questions:  1.  Have you seen any other providers since your last visit? No  2.  Any changes in your medicines or health? No  PCP : Lillard Anes, MD  Allergies:  No Known Allergies  Medications: Outpatient Encounter Medications as of 05/18/2020  Medication Sig   Acetaminophen (TYLENOL PO) Take 500 mg by mouth at bedtime.    ALPRAZolam (XANAX) 0.25 MG tablet Take 1 tablet (0.25 mg total) by mouth 2 (two) times daily as needed for anxiety. (Patient taking differently: Take 0.125 mg by mouth 2 (two) times daily as needed for anxiety. )   amiodarone (PACERONE) 200 MG tablet TAKE 1 TABLET BY MOUTH EVERY DAY   apixaban (ELIQUIS) 5 MG TABS tablet Take 1 tablet (5 mg total) by mouth 2 (two) times daily.   atorvastatin (LIPITOR) 20 MG tablet Take 1 tablet (20 mg total) by mouth daily.   Budeson-Glycopyrrol-Formoterol 160-9-4.8 MCG/ACT AERO Inhale 2 puffs into the lungs daily.   carvedilol (COREG) 3.125 MG tablet TAKE 1 TABLET TWICE DAILY WITH MEALS.   erythromycin ophthalmic ointment Place 1 application into both eyes at bedtime. (Patient not taking: Reported on 12/16/2019)   furosemide (LASIX) 20 MG tablet Take 1 tablet (20 mg total) by mouth 3 (three) times a week. Take any day you weigh 173 lbs or less   loratadine (CLARITIN) 10 MG tablet Take 10 mg daily as needed by mouth for allergies.   Multiple Vitamin (MULTI VITAMIN) TABS Take 1 tablet by mouth daily at 2 PM.   polyethylene glycol (MIRALAX / GLYCOLAX) packet Take 17 g by mouth daily.    No facility-administered encounter medications on file as of 05/18/2020.    Current Diagnosis: Patient Active Problem List   Diagnosis Date Noted   GAD (generalized anxiety disorder) 11/26/2019   Stye 11/26/2019   Mixed  hyperlipidemia 11/11/2019   On amiodarone therapy 07/27/2017   Persistent atrial fibrillation (HCC)    Atypical atrial flutter ([place]) 07/06/2017   Tricuspid regurgitation 06/19/2017   Chronic anticoagulation 06/19/2017   Chronic combined systolic and diastolic heart failure ([place]) 06/12/2017   Pleural effusion on right 06/12/2017   Hyponatremia 06/12/2017   Transaminitis 06/12/2017   Hypertensive heart disease with heart failure ([place]) 06/12/2017   COPD (chronic obstructive pulmonary disease) ([place]) 06/12/2017   Reviewed chart prior to disease state call. Spoke with patient regarding BP  Recent Office Vitals: BP Readings from Last 3 Encounters:  11/26/19 114/70  11/11/19 140/90  09/27/19 130/78   Pulse Readings from Last 3 Encounters:  11/26/19 61  11/11/19 60  09/27/19 60    Wt Readings from Last 3 Encounters:  11/26/19 178 lb (80.7 kg)  11/11/19 180 lb (81.6 kg)  09/27/19 183 lb (83 kg)     Kidney Function Lab Results  Component Value Date/Time   CREATININE 0.86 11/11/2019 09:08 AM   CREATININE 1.05 (H) 09/27/2019 03:57 PM   GFRNONAA 62 11/11/2019 09:08 AM   GFRAA 72 11/11/2019 09:08 AM    BMP Latest Ref Rng & Units 11/11/2019 09/27/2019 03/02/2018  Glucose 65 - 99 mg/dL 95 90 93  BUN 8 - 27 mg/dL 15 18 21   Creatinine 0.57 - 1.00 mg/dL 0.86 1.05(H) 1.03(H)  BUN/Creat Ratio 12 - 28 17 17 20   Sodium 134 - 144 mmol/L 141 139 140  Potassium 3.5 - 5.2 mmol/L 4.6 4.4 4.3  Chloride 96 - 106 mmol/L 103 100 100  CO2 20 - 29 mmol/L 23 27 28   Calcium 8.7 - 10.3 mg/dL 9.5 9.0 9.4     Current antihypertensive regimen:  ? Carvedilol 3.125 mg twice daily ? Furosemide 20 mg three times weekly  How often are you checking your Blood Pressure? 1-2x per week   Current home BP readings: 05/13/20- 139/88, 05/14/20-132/85  What recent interventions/DTPs have been made by any provider to improve Blood Pressure control since last CPP Visit: Patient is following instructions  to eat healthier, continue medications and, extensively exercising.  Any recent hospitalizations or ED visits since last visit with CPP? No  What diet changes have been made to improve Blood Pressure Control?  o Patient states that she is choosing healthier food options and is not using any salt on foods.   What exercise is being done to improve your Blood Pressure Control?  o Patient states that she is exercising twice a week (Tues and Thurs) for 45 minutes  to an hour.   Adherence Review: Is the patient currently on ACE/ARB medication? No Does the patient have >5 day gap between last estimated fill dates? No  05/18/2020 Name: Stephanie Douglas MRN: [id number] DOB: 1934/04/30 Stephanie Douglas is a 86 y.o. year old female who is a primary care patient of Lillard Anes, MD.  Comprehensive medication review performed; Spoke to patient regarding cholesterol  Lipid Panel    Component Value Date/Time   CHOL 135 11/11/2019 0908   TRIG 91 11/11/2019 0908   HDL 36 (L) 11/11/2019 0908   LDLCALC 82 11/11/2019 0908    10-year ASCVD risk score: The ASCVD Risk score Mikey Bussing DC Jr., et al., 2013) failed to calculate for the following reasons:   The 2013 ASCVD risk score is only valid for ages 84 to 84   Current antihyperlipidemic regimen:   atorvastatin 20 mg daily  Previous antihyperlipidemic medications tried: None Identified.   ASCVD risk enhancing conditions: age >84, DM, HTN, CKD, CHF, None identified.   What recent interventions/DTPs have been made by any provider to improve Cholesterol control since last CPP Visit: Patient is following instructions to eat healthier, continue medications and, extensively exercising.  Any recent hospitalizations or ED visits since last visit with CPP? No  What diet changes have been made to improve Cholesterol?  o Patient states that she is choosing healthier food options and is not using any salt on foods.   What exercise is being done to  improve your Blood Pressure Control?  o Patient states that she is exercising twice a week (Tues and Thurs) for 45 minutes  to an hour.   Adherence Review: Does the patient have >5 day gap between last estimated fill dates? No   Goals Addressed   None     Follow-Up:  Pharmacist Review   Sherre Poot, CPP. Notified.  [facility] Pharmacist Assistant  [phone]

## 2020-05-29 ENCOUNTER — Telehealth: Payer: Self-pay

## 2020-05-29 NOTE — Chronic Care Management (AMB) (Signed)
Patient's assistance for Judithann Sauger renewal due for 2022 year. Pharmacist coordinating application and will submit when due.   Sherre Poot, PharmD, [facility] Clinical Pharmacist [facility] [phone] (office) [phone] (mobile)

## 2020-06-01 ENCOUNTER — Encounter: Payer: Self-pay | Admitting: Legal Medicine

## 2020-06-01 ENCOUNTER — Other Ambulatory Visit: Payer: Self-pay

## 2020-06-01 ENCOUNTER — Ambulatory Visit (INDEPENDENT_AMBULATORY_CARE_PROVIDER_SITE_OTHER): Payer: PPO | Admitting: Legal Medicine

## 2020-06-01 DIAGNOSIS — Z0000 Encounter for general adult medical examination without abnormal findings: Secondary | ICD-10-CM | POA: Diagnosis not present

## 2020-06-01 NOTE — Progress Notes (Signed)
Subjective:  Patient ID: Stephanie Douglas, female    DOB: 24-Oct-1933  Age: 86 y.o. MRN: [id number]  Chief Complaint  Patient presents with  . Annual Exam    pt isn't fasting, flu shot today    HPI Encounter for general adult medical examination without abnormal findings  Physical ("At Risk" items are starred): Patient's last physical exam was 1 year ago .  Smoking: Life-long non-smoker ;  Physical Activity: Exercises at least 3 times per week ;  Alcohol/Drug Use: Is a non-drinker ; No illicit drug use ;  Patient is not afflicted from Stress Incontinence and Urge Incontinence  Safety: reviewed ; Patient wears a seat belt, has smoke detectors, has carbon monoxide detectors, practices appropriate gun safety, and wears sunscreen with extended sun exposure. Dental Care: biannual cleanings, brushes and flosses daily. Ophthalmology/Optometry: Annual visit.  Hearing loss:  Vision impairments: none  . See hearing   Fall Risk  06/01/2020 02/25/2020  Falls in the past year? 1 0  Comment - Emmi Telephone Survey: data to providers prior to load  Number falls in past yr: 0 -  Injury with Fall? 1 -     Depression screen [facility] 2/9 06/01/2020 12/16/2019  Decreased Interest 0 1  Down, Depressed, Hopeless 0 1  PHQ - 2 Score 0 2  Altered sleeping - 3  Tired, decreased energy - 3  Change in appetite - 0  Feeling bad or failure about yourself  - 1  Trouble concentrating - 1  Moving slowly or fidgety/restless - 1  Suicidal thoughts - 0  PHQ-9 Score - 11  Difficult doing work/chores - Somewhat difficult     Mini-Cog - 06/01/20 1556    Normal clock drawing test? yes    How many words correct? 3            Functional Status Survey: Is the patient deaf or have difficulty hearing?: No Does the patient have difficulty seeing, even when wearing glasses/contacts?: No Does the patient have difficulty concentrating, remembering, or making decisions?: No Does the patient have difficulty walking  or climbing stairs?: No Does the patient have difficulty dressing or bathing?: No Does the patient have difficulty doing errands alone such as visiting a doctor's office or shopping?: No   Social Hx   Social History   Socioeconomic History  . Marital status: Widowed    Spouse name: Not on file  . Number of children: Not on file  . Years of education: Not on file  . Highest education level: Not on file  Occupational History  . Not on file  Tobacco Use  . Smoking status: Never Smoker  . Smokeless tobacco: Never Used  Vaping Use  . Vaping Use: Never used  Substance and Sexual Activity  . Alcohol use: No  . Drug use: No  . Sexual activity: Not Currently  Other Topics Concern  . Not on file  Social History Narrative  . Not on file   Social Determinants of Health   Financial Resource Strain: Low Risk   . Difficulty of Paying Living Expenses: Not hard at all  Food Insecurity: No Food Insecurity  . Worried About Charity fundraiser in the Last Year: Never true  . Ran Out of Food in the Last Year: Never true  Transportation Needs: No Transportation Needs  . Lack of Transportation (Medical): No  . Lack of Transportation (Non-Medical): No  Physical Activity: Insufficiently Active  . Days of Exercise per Week: 5 days  .  Minutes of Exercise per Session: 10 min  Stress: Stress Concern Present  . Feeling of Stress : To some extent  Social Connections: Moderately Integrated  . Frequency of Communication with Friends and Family: More than three times a week  . Frequency of Social Gatherings with Friends and Family: More than three times a week  . Attends Religious Services: More than 4 times per year  . Active Member of Clubs or Organizations: Yes  . Attends Archivist Meetings: 1 to 4 times per year  . Marital Status: Widowed   Past Medical History:  Diagnosis Date  . COPD (chronic obstructive pulmonary disease) ([place])   . Depression   . HTN (hypertension)   .  Hyperlipidemia   . PONV (postoperative nausea and vomiting)    Family History  Problem Relation Age of Onset  . CAD Mother   . Cardiomyopathy Mother   . Diabetes Mother   . Asthma Father   . Breast cancer Sister   . Diabetes Brother   . Hypertension Brother   . AAA (abdominal aortic aneurysm) Sister     Review of Systems  Constitutional: Negative.   HENT: Negative.   Eyes: Negative.   Respiratory: Negative.   Cardiovascular: Negative.   Gastrointestinal: Negative.   Endocrine: Negative.   Genitourinary: Negative.   Musculoskeletal: Negative.   Skin: Negative.   Allergic/Immunologic: Negative.   Neurological: Negative.   Psychiatric/Behavioral: Negative.      Objective:  BP 122/60   Pulse 76   Temp 97.7 F (36.5 C) (Temporal)   Resp 16   Ht 5\' 5"  (1.651 m)   Wt 178 lb 6.4 oz (80.9 kg)   SpO2 95%   BMI 29.69 kg/m   BP/Weight 06/01/2020 11/26/2019 09/14/863  Systolic BP 784 696 295  Diastolic BP 60 70 90  Wt. (Lbs) 178.4 178 180  BMI 29.69 29.62 29.95    Physical Exam Constitutional:      Appearance: Normal appearance.  Cardiovascular:     Rate and Rhythm: Normal rate and regular rhythm.     Pulses: Normal pulses.     Heart sounds: Normal heart sounds.  Pulmonary:     Effort: Pulmonary effort is normal.     Breath sounds: Normal breath sounds.  Musculoskeletal:     Cervical back: Normal range of motion and neck supple.  Neurological:     Mental Status: She is alert.     Lab Results  Component Value Date   WBC 7.8 11/11/2019   HGB 14.0 11/11/2019   HCT 41.4 11/11/2019   PLT 147 (L) 11/11/2019   GLUCOSE 95 11/11/2019   CHOL 135 11/11/2019   TRIG 91 11/11/2019   HDL 36 (L) 11/11/2019   LDLCALC 82 11/11/2019   ALT 32 11/11/2019   AST 33 11/11/2019   NA 141 11/11/2019   K 4.6 11/11/2019   CL 103 11/11/2019   CREATININE 0.86 11/11/2019   BUN 15 11/11/2019   CO2 23 11/11/2019   TSH 3.310 09/27/2019      Assessment & Plan:  1. Routine  general medical examination at a health care facility  AWV performed, recheck in one year    These are the goals we discussed: Goals    . Pharmacy Care Plan BP     CARE PLAN ENTRY  Current Barriers:  . Chronic Disease Management support, education, and care coordination needs related to Hypertension, Hyperlipidemia, and COPD   Hypertension . Pharmacist Clinical Goal(s): o Over the next 90  days, patient will work with PharmD and providers to achieve BP goal <140/90 . Current regimen:  o Carvedilol 3.125 mg twice daily o Furosemide 20 mg three times weekly . Interventions: o Keep up the good work checking bp and weight daily. o Keep up the good work attending exercise class twice a week.  o Reviewed recent blood pressures. Blood Pressure readings have been elevated in the past month ~150s/90s. Patient denies swelling.  . Patient self care activities - Over the next 90 days, patient will: o Check BP daily, document, and provide at future appointments o Ensure daily salt intake < 2300 mg/day  Hyperlipidemia . Pharmacist Clinical Goal(s): o Over the next 90 days, patient will work with PharmD and providers to maintain LDL goal < 100 mg/dL . Current regimen:  o Atorvastatin 20 mg daily . Interventions: o Keep up the good work attending exercise class twice weekly.  . Patient self care activities - Over the next 90 days, patient will: o Continue to take medication as prescribed and contact provider with any questions/concerns.   Medication management . Pharmacist Clinical Goal(s): o Over the next 90 days, patient will work with PharmD and providers to maintain optimal medication adherence . Current pharmacy: [facility] . Interventions o Comprehensive medication review performed. o Continue current medication management strategy . Patient self care activities - Over the next 90 days, patient will: o Focus on medication adherence by continuing to use pill box.  o Take medications  as prescribed o Report any questions or concerns to PharmD and/or provider(s)  Please see past updates related to this goal by clicking on the "Past Updates" button in the selected goal          This is a list of the screening recommended for you and due dates:  Health Maintenance  Topic Date Due  . Tetanus Vaccine  Never done  . DEXA scan (bone density measurement)  Never done  . Pneumonia vaccines (1 of 2 - PCV13) Never done  . Flu Shot  03/01/2020  . COVID-19 Vaccine  Completed     AN INDIVIDUALIZED CARE PLAN: was established or reinforced today.   SELF MANAGEMENT: The patient and I together assessed ways to personally work towards obtaining the recommended goals  Support needs The patient and/or family needs were assessed and services were offered and not necessary at this time.    Follow-up: Return in about 2 months (around 08/01/2020) for chronic. [place] [phone]

## 2020-06-20 ENCOUNTER — Other Ambulatory Visit: Payer: Self-pay | Admitting: Cardiology

## 2020-06-20 DIAGNOSIS — Z79899 Other long term (current) drug therapy: Secondary | ICD-10-CM

## 2020-06-21 ENCOUNTER — Other Ambulatory Visit: Payer: Self-pay | Admitting: Cardiology

## 2020-06-21 DIAGNOSIS — Z79899 Other long term (current) drug therapy: Secondary | ICD-10-CM

## 2020-07-12 NOTE — Chronic Care Management (AMB) (Signed)
Chronic Care Management Pharmacy  Name: Stephanie Douglas  MRN: [id number] DOB: [birth year]  Chief Complaint/ HPI  [name],  86 y.o. , female presents for their Follow-Up CCM visit with the clinical pharmacist via telephone due to COVID-19 Pandemic.  PCP : Stephanie Anes, MD  Their chronic conditions include: Atrial flutter, Afib, COPD, Hypertensive Heart disease with Heart failure, Combined systolic and diastolic heart failure, HLD.  Office Visits: 06/01/2020 - [facility]. Flu shot given.   Consult Visit: 09/27/2019 - Dr. Bettina Douglas visit. No changes. Noted that thyroid and CMP need to be checked q6 months. Also noted that patient struggles to afford inhaler.  07/05/2019 - dry eyes seen by Dr. Pamelia Hoit.  06/11/2019 - [facility]. Skin cysts and melanocytic nevi.   Medications: Outpatient Encounter Medications as of 07/13/2020  Medication Sig  . Acetaminophen (TYLENOL PO) Take 500 mg by mouth at bedtime.   . ALPRAZolam (XANAX) 0.25 MG tablet Take 1 tablet (0.25 mg total) by mouth 2 (two) times daily as needed for anxiety.  Marland Kitchen amiodarone (PACERONE) 200 MG tablet TAKE 1 TABLET BY MOUTH EVERY DAY  . apixaban (ELIQUIS) 5 MG TABS tablet Take 1 tablet (5 mg total) by mouth 2 (two) times daily.  Marland Kitchen atorvastatin (LIPITOR) 20 MG tablet Take 1 tablet (20 mg total) by mouth daily.  . Budeson-Glycopyrrol-Formoterol 160-9-4.8 MCG/ACT AERO Inhale 2 puffs into the lungs daily.  . calcium carbonate (OSCAL) 1500 (600 Ca) MG TABS tablet Take by mouth 2 (two) times daily with a meal.  . carvedilol (COREG) 3.125 MG tablet TAKE 1 TABLET TWICE DAILY WITH MEALS.  Marland Kitchen erythromycin ophthalmic ointment Place 1 application into both eyes at bedtime.  . furosemide (LASIX) 20 MG tablet Take 1 tablet (20 mg total) by mouth 3 (three) times a week. Take any day you weigh 173 lbs or less  . loratadine (CLARITIN) 10 MG tablet Take 10 mg daily as needed by mouth for allergies. (Patient not taking: Reported  on 06/01/2020)  . Multiple Vitamin (MULTI VITAMIN) TABS Take 1 tablet by mouth daily at 2 PM.  . polyethylene glycol (MIRALAX / GLYCOLAX) packet Take 17 g by mouth daily.    No facility-administered encounter medications on file as of 07/13/2020.   No Known Allergies  SDOH Screenings   Alcohol Screen: Low Risk   . Last Alcohol Screening Score (AUDIT): 0  Depression (PHQ2-9): Low Risk   . PHQ-2 Score: 0  Financial Resource Strain: Low Risk   . Difficulty of Paying Living Expenses: Not hard at all  Food Insecurity: No Food Insecurity  . Worried About Charity fundraiser in the Last Year: Never true  . Ran Out of Food in the Last Year: Never true  Housing: Low Risk   . Last Housing Risk Score: 0  Physical Activity: Insufficiently Active  . Days of Exercise per Week: 5 days  . Minutes of Exercise per Session: 10 min  Social Connections: Moderately Integrated  . Frequency of Communication with Friends and Family: More than three times a week  . Frequency of Social Gatherings with Friends and Family: More than three times a week  . Attends Religious Services: More than 4 times per year  . Active Member of Clubs or Organizations: Yes  . Attends Archivist Meetings: 1 to 4 times per year  . Marital Status: Widowed  Stress: Stress Concern Present  . Feeling of Stress : To some extent  Tobacco Use: Low Risk   .  Smoking Tobacco Use: Never Smoker  . Smokeless Tobacco Use: Never Used  Transportation Needs: No Transportation Needs  . Lack of Transportation (Medical): No  . Lack of Transportation (Non-Medical): No     Current Diagnosis/Assessment:  Goals Addressed            This Visit's Progress   . COMPLETED: Pharmacy Care Plan - HLD      . Pharmacy Care Plan BP       CARE PLAN ENTRY  Current Barriers:  . Chronic Disease Management support, education, and care coordination needs related to Hypertension, Hyperlipidemia, and COPD   Hypertension . Pharmacist  Clinical Goal(s): o Over the next 90 days, patient will work with PharmD and providers to achieve BP goal <140/90 . Current regimen:  o Carvedilol 3.125 mg twice daily o Furosemide 20 mg three times weekly . Interventions: o Keep up the good work checking bp and weight daily. o Keep up the good work attending exercise class twice a week.  o Reviewed recent blood pressures. Discussed avoiding tightening blood pressure cuff too much. Schedule PCP visit if remains >140/90 mmHg at home.  . Patient self care activities - Over the next 90 days, patient will: o Check BP daily, document, and provide at future appointments o Ensure daily salt intake < 2300 mg/day  Hyperlipidemia . Pharmacist Clinical Goal(s): o Over the next 90 days, patient will work with PharmD and providers to maintain LDL goal < 100 mg/dL . Current regimen:  o Atorvastatin 20 mg daily . Interventions: o Keep up the good work attending exercise class twice weekly.  . Patient self care activities - Over the next 90 days, patient will: o Continue to take medication as prescribed and contact provider with any questions/concerns.   Medication management . Pharmacist Clinical Goal(s): o Over the next 90 days, patient will work with PharmD and providers to maintain optimal medication adherence . Current pharmacy: [facility] . Interventions o Comprehensive medication review performed. o Continue current medication management strategy . Patient self care activities - Over the next 90 days, patient will: o Focus on medication adherence by continuing to use pill box.  o Take medications as prescribed o Report any questions or concerns to PharmD and/or provider(s)  Please see past updates related to this goal by clicking on the "Past Updates" button in the selected goal         Heart Failure - managed by Dr. Bettina Douglas   Type: Hypertensive Heart disease with heart failure  Last ejection fraction: 35-40%  Patient has failed  these meds in past: amlodipine, entresto Patient is currently controlled on the following medications:   carvedilol 3.125 mg bid  furosemide 20 mg three times weekly  We discussed weighing daily; if you gain more than 3 pounds in one day or 5 pounds in one week call your doctor. Reports doing well and stable weight (169). BP at home: 149/90, 163/88, 144/87, 138/81 mmHg. In office 122/60 mmHg 06/01/2020.   When checking blood pressure at home. Both feet on the floor when checking it. Takes before or after medication. Empty bladder. Patient drinks decaf coffee. Thinks she has her cuff at home too tight. She sometimes continues to check until her reading comes down.   Patient will continue checking her blood pressure at home and schedule appointment if continues to be elevated. Scheduling to see Dr. Bettina Douglas in January/February 2022.    Plan  Continue current medications.   AFIB - Managed by Dr. Bettina Douglas  Patient is currently rhythm controlled. HR 61 BPM  Patient has failed these meds in past: digoxin 0.125 mg, metoprolol Patient is currently controlled on the following medications:   amiodarone 200 mg daily  eliquis 5 mg bid  We discussed:  diet and exercise extensively. Pharmacist coordinating renewal of Eliquis patient assistance for 2022. Patient will have to spend 3% of income before eligible. Patient will contact pharmacist to renew once 3% out of pocket.   Plan  Continue current medications    Hyperlipidemia   Lipid Panel     Component Value Date/Time   CHOL 135 11/11/2019 0908   TRIG 91 11/11/2019 0908   HDL 36 (L) 11/11/2019 0908   CHOLHDL 3.8 11/11/2019 0908   LDLCALC 82 11/11/2019 0908   LABVLDL 17 11/11/2019 0908     The ASCVD Risk score (Goff DC Jr., et al., 2013) failed to calculate for the following reasons:   The 2013 ASCVD risk score is only valid for ages 56 to 54   Patient has failed these meds in past: fish oil Patient is currently controlled on the  following medications:   atorvastatin 20 mg daily  We discussed:  diet and exercise extensively. She is scheduling to see Dr. Bettina Douglas in January/February 2022.    Plan  Continue current medications   COPD / Asthma / Tobacco   Eosinophil count:  No results found for: EOSPCT%                               Eos (Absolute):  Lab Results  Component Value Date/Time   EOSABS 0.2 11/11/2019 09:08 AM    Tobacco Status:  Social History   Tobacco Use  Smoking Status Never Smoker  Smokeless Tobacco Never Used    Patient has failed these meds in past: symbicort, trelegy Patient is currently controlled on the following medications:   Breztri 2 puffs twice daily Using maintenance inhaler regularly? Yes Frequency of rescue inhaler use:  never  We discussed:  proper inhaler technique. Patient reports good control of her symptoms with Breztri. Provided patient with phone number to [place] and [place] via [place]. Patient states she will call and renew. She will contact pharmacist with any questions/concerns.   Plan  Recommend renewing Breztri patient assistance over the phone with [place] and [place].    Anxiety   Patient has failed these meds in past: n/a Patient is currently controlled on the following medications:   Alprazolam 0.25 mg - 1/ 2 tablet bid prn.   We discussed: Patient reports overall well controlled anxiety. She does indicate feeling uptight lately with Christmas coming up and being unable to shop for her family. Her daughter is helping her. She feels that her frustration may be impacting her blood pressure as well.   Plan  Continue current medications   Osteopenia / Osteoporosis   Last DEXA Scan: 04/30/2019  T-Score femoral neck: -2.6  T-Score forearm radius: -2.4   No results found for: VD25OH   Patient is a candidate for pharmacologic treatment due to T-Score < -2.5 in femoral neck  Patient has failed these meds in past: none reported Patient is currently  uncontrolled on the following medications:  . Calcium 600 mg with D twice daily   We discussed:  Recommend [phone] units of vitamin D daily. Recommend 1200 mg of calcium daily from dietary and supplemental sources. Counseled on oral bisphosphonate administration: take in the morning, 30 minutes  prior to food with 6-8 oz of water. Do not lie down for at least 30 minutes after taking. Recommend weight-bearing and muscle strengthening exercises for building and maintaining bone density.  Discussed patient's last Dexa scan and option of beginning medication to improve bone health and reduce risk of fracture. Patient is willing. We discussed beginning with alendronate and option of Prolia if needed. Patient acknowledges understanding taking alendronate first thing in the morning with full glass of water and avoiding laying back down.   Plan  Recommend beginning alendronate 70 mg weekly. Repeat Dexa ~ 04/2021    Medication Management   Pt uses [facility] for all medications Uses weekly pill box Pt endorses good compliance.  We discussed: Patient taking medication and using weekly pill box. Patient indicates good compliance.   Plan Continue current medications as prescribed.   Follow up: 3 month

## 2020-07-13 ENCOUNTER — Other Ambulatory Visit: Payer: Self-pay | Admitting: Legal Medicine

## 2020-07-13 ENCOUNTER — Other Ambulatory Visit: Payer: Self-pay

## 2020-07-13 ENCOUNTER — Ambulatory Visit: Payer: PPO

## 2020-07-13 DIAGNOSIS — I110 Hypertensive heart disease with heart failure: Secondary | ICD-10-CM

## 2020-07-13 DIAGNOSIS — E782 Mixed hyperlipidemia: Secondary | ICD-10-CM

## 2020-07-13 DIAGNOSIS — F411 Generalized anxiety disorder: Secondary | ICD-10-CM

## 2020-07-13 DIAGNOSIS — M810 Age-related osteoporosis without current pathological fracture: Secondary | ICD-10-CM

## 2020-07-13 MED ORDER — ALENDRONATE SODIUM 70 MG PO TABS: 70.0000 mg | ORAL_TABLET | ORAL | 11 refills | Status: DC

## 2020-07-13 NOTE — Patient Instructions (Addendum)
Visit Information  Goals Addressed            This Visit's Progress   . COMPLETED: Pharmacy Care Plan - HLD      . Pharmacy Care Plan BP       CARE PLAN ENTRY  Current Barriers:  . Chronic Disease Management support, education, and care coordination needs related to Hypertension, Hyperlipidemia, and COPD   Hypertension . Pharmacist Clinical Goal(s): o Over the next 90 days, patient will work with PharmD and providers to achieve BP goal <140/90 . Current regimen:  o Carvedilol 3.125 mg twice daily o Furosemide 20 mg three times weekly . Interventions: o Keep up the good work checking bp and weight daily. o Keep up the good work attending exercise class twice a week.  o Reviewed recent blood pressures. Discussed avoiding tightening blood pressure cuff too much. Schedule PCP visit if remains >140/90 mmHg at home.  . Patient self care activities - Over the next 90 days, patient will: o Check BP daily, document, and provide at future appointments o Ensure daily salt intake < 2300 mg/day  Hyperlipidemia . Pharmacist Clinical Goal(s): o Over the next 90 days, patient will work with PharmD and providers to maintain LDL goal < 100 mg/dL . Current regimen:  o Atorvastatin 20 mg daily . Interventions: o Keep up the good work attending exercise class twice weekly.  . Patient self care activities - Over the next 90 days, patient will: o Continue to take medication as prescribed and contact provider with any questions/concerns.   Medication management . Pharmacist Clinical Goal(s): o Over the next 90 days, patient will work with PharmD and providers to maintain optimal medication adherence . Current pharmacy: [facility] . Interventions o Comprehensive medication review performed. o Continue current medication management strategy . Patient self care activities - Over the next 90 days, patient will: o Focus on medication adherence by continuing to use pill box.  o Take medications as  prescribed o Report any questions or concerns to PharmD and/or provider(s)  Please see past updates related to this goal by clicking on the "Past Updates" button in the selected goal         The patient verbalized understanding of instructions, educational materials, and care plan provided today and declined offer to receive copy of patient instructions, educational materials, and care plan.   Telephone follow up appointment with pharmacy team member scheduled for: 09/2020  Burnice Logan, [facility]  How to Take Your Blood Pressure You can take your blood pressure at home with a machine. You may need to check your blood pressure at home:  To check if you have high blood pressure (hypertension).  To check your blood pressure over time.  To make sure your blood pressure medicine is working. Supplies needed: You will need a blood pressure machine, or monitor. You can buy one at a drugstore or online. When choosing one:  Choose one with an arm cuff.  Choose one that wraps around your upper arm. Only one finger should fit between your arm and the cuff.  Do not choose one that measures your blood pressure from your wrist or finger. Your doctor can suggest a monitor. How to prepare Avoid these things for 30 minutes before checking your blood pressure:  Drinking caffeine.  Drinking alcohol.  Eating.  Smoking.  Exercising. Five minutes before checking your blood pressure:  Pee.  Sit in a dining chair. Avoid sitting in a soft couch or armchair.  Be quiet.  Do not talk. How to take your blood pressure Follow the instructions that came with your machine. If you have a digital blood pressure monitor, these may be the instructions: 1. Sit up straight. 2. Place your feet on the floor. Do not cross your ankles or legs. 3. Rest your left arm at the level of your heart. You may rest it on a table, desk, or chair. 4. Pull up your shirt sleeve. 5. Wrap the blood pressure cuff around the  upper part of your left arm. The cuff should be 1 inch (2.5 cm) above your elbow. It is best to wrap the cuff around bare skin. 6. Fit the cuff snugly around your arm. You should be able to place only one finger between the cuff and your arm. 7. Put the cord inside the groove of your elbow. 8. Press the power button. 9. Sit quietly while the cuff fills with air and loses air. 10. Write down the numbers on the screen. 11. Wait 2-3 minutes and then repeat steps 1-10. What do the numbers mean? Two numbers make up your blood pressure. The first number is called systolic pressure. The second is called diastolic pressure. An example of a blood pressure reading is "120 over 80" (or 120/80). If you are an adult and do not have a medical condition, use this guide to find out if your blood pressure is normal: Normal  First number: below 120.  Second number: below 80. Elevated  First number: 120-129.  Second number: below 80. Hypertension stage 1  First number: 130-139.  Second number: 80-89. Hypertension stage 2  First number: 140 or above.  Second number: 90 or above. Your blood pressure is above normal even if only the top or bottom number is above normal. Follow these instructions at home:  Check your blood pressure as often as your doctor tells you to.  Take your monitor to your next doctor's appointment. Your doctor will: ? Make sure you are using it correctly. ? Make sure it is working right.  Make sure you understand what your blood pressure numbers should be.  Tell your doctor if your medicines are causing side effects. Contact a doctor if:  Your blood pressure keeps being high. Get help right away if:  Your first blood pressure number is higher than 180.  Your second blood pressure number is higher than 120. This information is not intended to replace advice given to you by your health care provider. Make sure you discuss any questions you have with your health care  provider. Document Revised: 06/30/2017 Document Reviewed: 12/25/2015 Elsevier Patient Education  2020 [employer].

## 2020-07-13 NOTE — Progress Notes (Signed)
I called in alendronate lp

## 2020-08-19 ENCOUNTER — Telehealth: Payer: Self-pay

## 2020-08-19 NOTE — Progress Notes (Signed)
Chronic Care Management Pharmacy Assistant   Name: Stephanie Douglas  MRN: [id number] DOB: 1934/07/30  Reason for Encounter: Disease State call for blood pressure  Patient Questions:  1.  Have you seen any other providers since your last visit? No  2.  Any changes in your medicines or health? Yes, Patient has been taking 1/2 pill of ALPRAZolam (XANAX) 0.25 MG, she stated it was too strong if she took a whole one.     PCP : Lillard Anes, MD  Allergies:  No Known Allergies  Medications: Outpatient Encounter Medications as of 08/19/2020  Medication Sig   Acetaminophen (TYLENOL PO) Take 500 mg by mouth at bedtime.    alendronate (FOSAMAX) 70 MG tablet Take 1 tablet (70 mg total) by mouth every 7 (seven) days. Take with a full glass of water on an empty stomach.   ALPRAZolam (XANAX) 0.25 MG tablet TAKE 1 TABLET(0.25 MG) BY MOUTH TWICE DAILY AS NEEDED FOR ANXIETY   amiodarone (PACERONE) 200 MG tablet TAKE 1 TABLET BY MOUTH EVERY DAY   apixaban (ELIQUIS) 5 MG TABS tablet Take 1 tablet (5 mg total) by mouth 2 (two) times daily.   atorvastatin (LIPITOR) 20 MG tablet TAKE 1 TABLET(20 MG) BY MOUTH DAILY   Budeson-Glycopyrrol-Formoterol 160-9-4.8 MCG/ACT AERO Inhale 2 puffs into the lungs daily.   calcium carbonate (OSCAL) 1500 (600 Ca) MG TABS tablet Take by mouth 2 (two) times daily with a meal.   carvedilol (COREG) 3.125 MG tablet TAKE 1 TABLET TWICE DAILY WITH MEALS.   erythromycin ophthalmic ointment Place 1 application into both eyes at bedtime.   furosemide (LASIX) 20 MG tablet Take 1 tablet (20 mg total) by mouth 3 (three) times a week. Take any day you weigh 173 lbs or less   loratadine (CLARITIN) 10 MG tablet Take 10 mg daily as needed by mouth for allergies. (Patient not taking: Reported on 06/01/2020)   Multiple Vitamin (MULTI VITAMIN) TABS Take 1 tablet by mouth daily at 2 PM.   polyethylene glycol (MIRALAX / GLYCOLAX) packet Take 17 g by mouth daily.     No facility-administered encounter medications on file as of 08/19/2020.    Current Diagnosis: Patient Active Problem List   Diagnosis Date Noted   Routine general medical examination at a health care facility 06/01/2020   GAD (generalized anxiety disorder) 11/26/2019   Stye 11/26/2019   Mixed hyperlipidemia 11/11/2019   On amiodarone therapy 07/27/2017   Persistent atrial fibrillation (HCC)    Atypical atrial flutter ([place]) 07/06/2017   Tricuspid regurgitation 06/19/2017   Chronic anticoagulation 06/19/2017   Chronic combined systolic and diastolic heart failure ([place]) 06/12/2017   Pleural effusion on right 06/12/2017   Hyponatremia 06/12/2017   Transaminitis 06/12/2017   Hypertensive heart disease with heart failure ([place]) 06/12/2017   COPD (chronic obstructive pulmonary disease) ([place]) 06/12/2017   Reviewed chart prior to disease state call. Spoke with patient regarding BP  Recent Office Vitals: BP Readings from Last 3 Encounters:  06/01/20 122/60  11/26/19 114/70  11/11/19 140/90   Pulse Readings from Last 3 Encounters:  06/01/20 76  11/26/19 61  11/11/19 60    Wt Readings from Last 3 Encounters:  06/01/20 178 lb 6.4 oz (80.9 kg)  11/26/19 178 lb (80.7 kg)  11/11/19 180 lb (81.6 kg)     Kidney Function Lab Results  Component Value Date/Time   CREATININE 0.86 11/11/2019 09:08 AM   CREATININE 1.05 (H) 09/27/2019 03:57 PM   GFRNONAA 62 11/11/2019  09:08 AM   GFRAA 72 11/11/2019 09:08 AM    BMP Latest Ref Rng & Units 11/11/2019 09/27/2019 03/02/2018  Glucose 65 - 99 mg/dL 95 90 93  BUN 8 - 27 mg/dL 15 18 21   Creatinine 0.57 - 1.00 mg/dL 0.86 1.05(H) 1.03(H)  BUN/Creat Ratio 12 - 28 17 17 20   Sodium 134 - 144 mmol/L 141 139 140  Potassium 3.5 - 5.2 mmol/L 4.6 4.4 4.3  Chloride 96 - 106 mmol/L 103 100 100  CO2 20 - 29 mmol/L 23 27 28   Calcium 8.7 - 10.3 mg/dL 9.5 9.0 9.4     Current antihypertensive regimen:  ? Carvedilol 3.125 mg twice  daily ? Furosemide 20 mg three times weekly   How often are you checking your Blood Pressure? Patient tries to check her blood pressure at least once a day.   Current home BP readings:  08/19/20  Not taken yet today 08/18/20  128/76   What recent interventions/DTPs have been made by any provider to improve Blood Pressure control since last CPP Visit: Patient has started taking 1/2 pill of her anxiety medication ALPRAZolam (XANAX) 0.25 MG.    Any recent hospitalizations or ED visits since last visit with CPP? No    What diet changes have been made to improve Blood Pressure Control?  Patient stated she tries to watch her salt, she eats low salt chips.   What exercise is being done to improve your Blood Pressure Control? Patient stated she isn't 18 but she stays pretty active.   Adherence Review: Is the patient currently on ACE/ARB medication? Yes Does the patient have >5 day gap between last estimated fill dates? No    Follow-Up:  Pharmacist Review  Donette Larry, [place], [place] Pharmacist Assistant [phone]

## 2020-09-03 ENCOUNTER — Telehealth: Payer: Self-pay

## 2020-09-03 NOTE — Progress Notes (Signed)
    Chronic Care Management Pharmacy Assistant   Name: Daiana Vitiello  MRN: [id number] DOB: January 12, 1934  Reason for Encounter: PAP renewal for Eliquis   PCP : Lillard Anes, MD  Allergies:  No Known Allergies  Medications: Outpatient Encounter Medications as of 09/03/2020  Medication Sig  . Acetaminophen (TYLENOL PO) Take 500 mg by mouth at bedtime.   Marland Kitchen alendronate (FOSAMAX) 70 MG tablet Take 1 tablet (70 mg total) by mouth every 7 (seven) days. Take with a full glass of water on an empty stomach.  . ALPRAZolam (XANAX) 0.25 MG tablet TAKE 1 TABLET(0.25 MG) BY MOUTH TWICE DAILY AS NEEDED FOR ANXIETY  . amiodarone (PACERONE) 200 MG tablet TAKE 1 TABLET BY MOUTH EVERY DAY  . apixaban (ELIQUIS) 5 MG TABS tablet Take 1 tablet (5 mg total) by mouth 2 (two) times daily.  Marland Kitchen atorvastatin (LIPITOR) 20 MG tablet TAKE 1 TABLET(20 MG) BY MOUTH DAILY  . Budeson-Glycopyrrol-Formoterol 160-9-4.8 MCG/ACT AERO Inhale 2 puffs into the lungs daily.  . calcium carbonate (OSCAL) 1500 (600 Ca) MG TABS tablet Take by mouth 2 (two) times daily with a meal.  . carvedilol (COREG) 3.125 MG tablet TAKE 1 TABLET TWICE DAILY WITH MEALS.  Marland Kitchen erythromycin ophthalmic ointment Place 1 application into both eyes at bedtime.  . furosemide (LASIX) 20 MG tablet Take 1 tablet (20 mg total) by mouth 3 (three) times a week. Take any day you weigh 173 lbs or less  . loratadine (CLARITIN) 10 MG tablet Take 10 mg daily as needed by mouth for allergies. (Patient not taking: Reported on 06/01/2020)  . Multiple Vitamin (MULTI VITAMIN) TABS Take 1 tablet by mouth daily at 2 PM.  . polyethylene glycol (MIRALAX / GLYCOLAX) packet Take 17 g by mouth daily.    No facility-administered encounter medications on file as of 09/03/2020.    Current Diagnosis: Patient Active Problem List   Diagnosis Date Noted  . Routine general medical examination at a health care facility 06/01/2020  . GAD (generalized anxiety disorder) 11/26/2019   . Stye 11/26/2019  . Mixed hyperlipidemia 11/11/2019  . On amiodarone therapy 07/27/2017  . Persistent atrial fibrillation ([place])   . Atypical atrial flutter ([place]) 07/06/2017  . Tricuspid regurgitation 06/19/2017  . Chronic anticoagulation 06/19/2017  . Chronic combined systolic and diastolic heart failure ([place]) 06/12/2017  . Pleural effusion on right 06/12/2017  . Hyponatremia 06/12/2017  . Transaminitis 06/12/2017  . Hypertensive heart disease with heart failure ([place]) 06/12/2017  . COPD (chronic obstructive pulmonary disease) ([place]) 06/12/2017   Left message for patient to call to verify home address and set up time to go to office and sign PAP forms.    I have filled out and submitted application to Donette Larry, CPP for signatures and approval   Follow-Up:  Patient Assistance Coordination and Pharmacist Review  Donette Larry, CPP notified  Clarita Leber, [place] Pharmacist Assistant [phone]

## 2020-09-08 ENCOUNTER — Other Ambulatory Visit: Payer: Self-pay | Admitting: Cardiology

## 2020-09-08 DIAGNOSIS — I4819 Other persistent atrial fibrillation: Secondary | ICD-10-CM

## 2020-09-08 NOTE — Telephone Encounter (Signed)
Refill sent to pharmacy.   

## 2020-09-13 ENCOUNTER — Other Ambulatory Visit: Payer: Self-pay | Admitting: Cardiology

## 2020-09-14 ENCOUNTER — Other Ambulatory Visit: Payer: Self-pay | Admitting: Cardiology

## 2020-09-14 NOTE — Telephone Encounter (Signed)
Rx refill sent to pharmacy. 

## 2020-09-29 ENCOUNTER — Other Ambulatory Visit: Payer: Self-pay | Admitting: Cardiology

## 2020-09-29 DIAGNOSIS — Z79899 Other long term (current) drug therapy: Secondary | ICD-10-CM

## 2020-09-29 NOTE — Telephone Encounter (Signed)
Rx refill sent to pharmacy. 

## 2020-10-05 ENCOUNTER — Other Ambulatory Visit: Payer: Self-pay | Admitting: Cardiology

## 2020-10-05 DIAGNOSIS — E785 Hyperlipidemia, unspecified: Secondary | ICD-10-CM | POA: Insufficient documentation

## 2020-10-05 DIAGNOSIS — F32A Depression, unspecified: Secondary | ICD-10-CM | POA: Insufficient documentation

## 2020-10-05 DIAGNOSIS — Z9889 Other specified postprocedural states: Secondary | ICD-10-CM | POA: Insufficient documentation

## 2020-10-05 DIAGNOSIS — I10 Essential (primary) hypertension: Secondary | ICD-10-CM | POA: Insufficient documentation

## 2020-10-05 DIAGNOSIS — R112 Nausea with vomiting, unspecified: Secondary | ICD-10-CM | POA: Insufficient documentation

## 2020-10-09 ENCOUNTER — Encounter: Payer: Self-pay | Admitting: Cardiology

## 2020-10-09 ENCOUNTER — Other Ambulatory Visit: Payer: Self-pay

## 2020-10-09 ENCOUNTER — Ambulatory Visit: Payer: PPO | Admitting: Cardiology

## 2020-10-09 VITALS — BP 134/72 | HR 63 | Ht 65.0 in | Wt 180.2 lb

## 2020-10-09 DIAGNOSIS — Z7901 Long term (current) use of anticoagulants: Secondary | ICD-10-CM | POA: Diagnosis not present

## 2020-10-09 DIAGNOSIS — Z79899 Other long term (current) drug therapy: Secondary | ICD-10-CM | POA: Diagnosis not present

## 2020-10-09 DIAGNOSIS — I110 Hypertensive heart disease with heart failure: Secondary | ICD-10-CM | POA: Diagnosis not present

## 2020-10-09 DIAGNOSIS — I480 Paroxysmal atrial fibrillation: Secondary | ICD-10-CM

## 2020-10-09 MED ORDER — CARVEDILOL 3.125 MG PO TABS: 3.1250 mg | ORAL_TABLET | Freq: Two times a day (BID) | ORAL | 3 refills | Status: DC

## 2020-10-09 NOTE — Progress Notes (Signed)
Cardiology Office Note:    Date:  10/09/2020   ID:  Stephanie Douglas, DOB 1934-01-18, MRN [id number]  PCP:  Lillard Anes, MD  Cardiologist:  Shirlee More, MD    Referring MD: Laurence Spates recheck CMP TSH T3-T4 in 6 months on amiodarone   ASSESSMENT:    1. Paroxysmal atrial fibrillation (HCC)   2. On amiodarone therapy   3. Chronic anticoagulation   4. Hypertensive heart disease with heart failure ([place])    PLAN:    In order of problems listed above:  1. Stable maintaining sinus rhythm she has had no lab work done for years I will do a CMP for lipids check thyroids for toxicity continue low-dose amiodarone her current anticoagulant. 2. BP at target no evidence of fluid overload she takes a low-dose of a loop diuretic we will continue it along with carvedilol for hypertension check renal function and potassium.   Next appointment: 1 year   Medication Adjustments/Labs and Tests Ordered: Current medicines are reviewed at length with the patient today.  Concerns regarding medicines are outlined above.  No orders of the defined types were placed in this encounter.  No orders of the defined types were placed in this encounter.   Chief Complaint  Patient presents with  . Atrial Fibrillation    On amiodarone  . Anticoagulation  . Congestive Heart Failure  . Hypertension    History of Present Illness:    Stephanie Douglas is a 87 y.o. female with a hx of paroxysmal atrial fibrillation anticoagulated on amiodarone and hypertension with heart failure and hyperlipidemia last seen 09/27/2019 maintaining sinus rhythm.  TSH was normal last checked 1 year ago and 09/27/2019 at that time liver function test were normal.  Compliance with diet, lifestyle and medications: Yes  She is doing well unfortunately she has had a couple falls recently and is considering perhaps moving out of her home to a senior apartment complex. Minimal palpitation no syncope  chest pain shortness of breath orthopnea. She tolerates her anticoagulant without bleeding and her statin without muscle pain or weakness Past Medical History:  Diagnosis Date  . COPD (chronic obstructive pulmonary disease) ([place])   . Depression   . HTN (hypertension)   . Hyperlipidemia   . PONV (postoperative nausea and vomiting)   . PONV (postoperative nausea and vomiting)     Past Surgical History:  Procedure Laterality Date  . ABDOMINAL HYSTERECTOMY    . CARDIOVERSION N/A 06/13/2017   Procedure: CARDIOVERSION;  Surgeon: Lelon Perla, MD;  Location[facility] ENDOSCOPY;  Service: Cardiovascular;  Laterality: N/A;  . CARDIOVERSION N/A 07/07/2017   Procedure: CARDIOVERSION;  Surgeon: Sanda Klein, MD;  Location: [facility] ENDOSCOPY;  Service: Cardiovascular;  Laterality: N/A;  . TEE WITHOUT CARDIOVERSION N/A 06/13/2017   Procedure: TRANSESOPHAGEAL ECHOCARDIOGRAM (TEE);  Surgeon: Lelon Perla, MD;  Location: [facility] ENDOSCOPY;  Service: Cardiovascular;  Laterality: N/A;    Current Medications: Current Meds  Medication Sig  . Acetaminophen (TYLENOL PO) Take 500 mg by mouth at bedtime.   . ALPRAZolam (XANAX) 0.25 MG tablet TAKE 1 TABLET(0.25 MG) BY MOUTH TWICE DAILY AS NEEDED FOR ANXIETY  . amiodarone (PACERONE) 200 MG tablet TAKE 1 TABLET BY MOUTH EVERY DAY  . apixaban (ELIQUIS) 5 MG TABS tablet Take 1 tablet (5 mg total) by mouth 2 (two) times daily.  Marland Kitchen atorvastatin (LIPITOR) 20 MG tablet TAKE 1 TABLET(20 MG) BY MOUTH DAILY  . Budeson-Glycopyrrol-Formoterol 160-9-4.8 MCG/ACT AERO Inhale 2 puffs into the lungs  daily.  . calcium carbonate (OSCAL) 1500 (600 Ca) MG TABS tablet Take by mouth 2 (two) times daily with a meal.  . carvedilol (COREG) 3.125 MG tablet TAKE 1 TABLET TWICE DAILY WITH MEALS.  Marland Kitchen docusate sodium (COLACE) 250 MG capsule Take 250 mg by mouth daily as needed for constipation.  . furosemide (LASIX) 20 MG tablet TAKE 1 TABLET BY MOUTH THREE TIMES A WEEK, TAKE ANY DAY YOU WEIGH  OVER 173 POUNDS OR LESS  . loratadine (CLARITIN) 10 MG tablet Take 10 mg by mouth daily as needed for allergies.  . Multiple Vitamins-Minerals (CENTRUM SILVER 50+WOMEN PO) Take 1 tablet by mouth daily.  Marland Kitchen saccharomyces boulardii (FLORASTOR) 250 MG capsule Take 250 mg by mouth 2 (two) times daily.  . TURMERIC CURCUMIN PO Take 1 tablet by mouth at bedtime.  . [DISCONTINUED] erythromycin ophthalmic ointment Place 1 application into both eyes at bedtime.     Allergies:   Patient has no known allergies.   Social History   Socioeconomic History  . Marital status: Widowed    Spouse name: Not on file  . Number of children: Not on file  . Years of education: Not on file  . Highest education level: Not on file  Occupational History  . Not on file  Tobacco Use  . Smoking status: Never Smoker  . Smokeless tobacco: Never Used  Vaping Use  . Vaping Use: Never used  Substance and Sexual Activity  . Alcohol use: No  . Drug use: No  . Sexual activity: Not Currently  Other Topics Concern  . Not on file  Social History Narrative  . Not on file   Social Determinants of Health   Financial Resource Strain: Low Risk   . Difficulty of Paying Living Expenses: Not hard at all  Food Insecurity: No Food Insecurity  . Worried About Charity fundraiser in the Last Year: Never true  . Ran Out of Food in the Last Year: Never true  Transportation Needs: No Transportation Needs  . Lack of Transportation (Medical): No  . Lack of Transportation (Non-Medical): No  Physical Activity: Insufficiently Active  . Days of Exercise per Week: 5 days  . Minutes of Exercise per Session: 10 min  Stress: Stress Concern Present  . Feeling of Stress : To some extent  Social Connections: Moderately Integrated  . Frequency of Communication with Friends and Family: More than three times a week  . Frequency of Social Gatherings with Friends and Family: More than three times a week  . Attends Religious Services: More  than 4 times per year  . Active Member of Clubs or Organizations: Yes  . Attends Archivist Meetings: 1 to 4 times per year  . Marital Status: Widowed     Family History: The patient's family history includes AAA (abdominal aortic aneurysm) in her sister; Asthma in her father; Breast cancer in her sister; CAD in her mother; Cardiomyopathy in her mother; Diabetes in her brother and mother; Hypertension in her brother. ROS:   Please see the history of present illness.    All other systems reviewed and are negative.  EKGs/Labs/Other Studies Reviewed:    The following studies were reviewed today:  EKG:  EKG ordered today and personally reviewed.  The ekg ordered today demonstrates sinus rhythm 63 bpm first-degree AV block left anterior hemiblock normal QT interval  Recent Labs: 11/11/2019: ALT 32; BUN 15; Creatinine, Ser 0.86; Hemoglobin 14.0; Platelets 147; Potassium 4.6; Sodium 141  Recent Lipid  Panel    Component Value Date/Time   CHOL 135 11/11/2019 0908   TRIG 91 11/11/2019 0908   HDL 36 (L) 11/11/2019 0908   CHOLHDL 3.8 11/11/2019 0908   LDLCALC 82 11/11/2019 0908    Physical Exam:    VS:  BP 134/72   Pulse 63   Ht 5\' 5"  (1.651 m)   Wt 180 lb 3.2 oz (81.7 kg)   SpO2 94%   BMI 29.99 kg/m     Wt Readings from Last 3 Encounters:  10/09/20 180 lb 3.2 oz (81.7 kg)  06/01/20 178 lb 6.4 oz (80.9 kg)  11/26/19 178 lb (80.7 kg)     GEN:  Well nourished, well developed in no acute distress HEENT: Normal NECK: No JVD; No carotid bruits LYMPHATICS: No lymphadenopathy CARDIAC: RRR, no murmurs, rubs, gallops RESPIRATORY:  Clear to auscultation without rales, wheezing or rhonchi  ABDOMEN: Soft, non-tender, non-distended MUSCULOSKELETAL:  No edema; No deformity  SKIN: Warm and dry NEUROLOGIC:  Alert and oriented x 3 PSYCHIATRIC:  Normal affect    Signed, Shirlee More, MD  10/09/2020 1:19 PM    [facility]

## 2020-10-09 NOTE — Patient Instructions (Addendum)
Medication Instructions:  Your physician recommends that you continue on your current medications as directed. Please refer to the Current Medication list given to you today.  *If you need a refill on your cardiac medications before your next appointment, please call your pharmacy*   Lab Work: Your physician recommends that you return for lab work in: TODAY CMP, Lipids, TSH, T3, T4 If you have labs (blood work) drawn today and your tests are completely normal, you will receive your results only by: Marland Kitchen MyChart Message (if you have MyChart) OR . A paper copy in the mail If you have any lab test that is abnormal or we need to change your treatment, we will call you to review the results.   Testing/Procedures: None   Follow-Up: At [facility], you and your health needs are our priority.  As part of our continuing mission to provide you with exceptional heart care, we have created designated Provider Care Teams.  These Care Teams include your primary Cardiologist (physician) and Advanced Practice Providers (APPs -  Physician Assistants and Nurse Practitioners) who all work together to provide you with the care you need, when you need it.  We recommend signing up for the patient portal called "MyChart".  Sign up information is provided on this After Visit Summary.  MyChart is used to connect with patients for Virtual Visits (Telemedicine).  Patients are able to view lab/test results, encounter notes, upcoming appointments, etc.  Non-urgent messages can be sent to your provider as well.   To learn more about what you can do with MyChart, go to [email or web address].    Your next appointment:   1 year(s)  The format for your next appointment:   In Person  Provider:   Shirlee More, MD   Other Instructions

## 2020-10-10 LAB — COMPREHENSIVE METABOLIC PANEL
ALT: 27 IU/L (ref 0–32)
AST: 28 IU/L (ref 0–40)
Albumin/Globulin Ratio: 1.3 (ref 1.2–2.2)
Albumin: 4 g/dL (ref 3.6–4.6)
Alkaline Phosphatase: 75 IU/L (ref 44–121)
BUN/Creatinine Ratio: 16 (ref 12–28)
BUN: 18 mg/dL (ref 8–27)
Bilirubin Total: 0.4 mg/dL (ref 0.0–1.2)
CO2: 22 mmol/L (ref 20–29)
Calcium: 9.1 mg/dL (ref 8.7–10.3)
Chloride: 106 mmol/L (ref 96–106)
Creatinine, Ser: 1.14 mg/dL — ABNORMAL HIGH (ref 0.57–1.00)
Globulin, Total: 3.1 g/dL (ref 1.5–4.5)
Glucose: 101 mg/dL — ABNORMAL HIGH (ref 65–99)
Potassium: 4.3 mmol/L (ref 3.5–5.2)
Sodium: 141 mmol/L (ref 134–144)
Total Protein: 7.1 g/dL (ref 6.0–8.5)
eGFR: 47 mL/min/{1.73_m2} — ABNORMAL LOW (ref >59)

## 2020-10-10 LAB — TSH+T4F+T3FREE
Free T4: 1.45 ng/dL (ref 0.82–1.77)
T3, Free: 2.3 pg/mL (ref 2.0–4.4)
TSH: 3.54 u[IU]/mL (ref 0.450–4.500)

## 2020-10-10 LAB — LIPID PANEL
Chol/HDL Ratio: 3.6 ratio (ref 0.0–4.4)
Cholesterol, Total: 149 mg/dL (ref 100–199)
HDL: 41 mg/dL (ref >39)
LDL Chol Calc (NIH): 93 mg/dL (ref 0–99)
Triglycerides: 75 mg/dL (ref 0–149)
VLDL Cholesterol Cal: 15 mg/dL (ref 5–40)

## 2020-10-12 ENCOUNTER — Telehealth: Payer: PPO

## 2020-10-12 ENCOUNTER — Telehealth: Payer: Self-pay

## 2020-10-12 NOTE — Telephone Encounter (Signed)
Spoke with patient regarding results and recommendation.  Patient verbalizes understanding and is agreeable to plan of care. Advised patient to call back with any issues or concerns.  

## 2020-10-12 NOTE — Progress Notes (Deleted)
Chronic Care Management Pharmacy Note  10/12/2020 Name:  Stephanie Douglas MRN:  [id number] DOB:  12-16-1933  Subjective: Stephanie Douglas is an 87 y.o. year old female who is a primary patient of Henrene Pastor, Zeb Comfort, MD.  The CCM team was consulted for assistance with disease management and care coordination needs.    Engaged with patient by telephone for follow up visit in response to provider referral for pharmacy case management and/or care coordination services.   Consent to Services:  The patient was given information about Chronic Care Management services, agreed to services, and gave verbal consent prior to initiation of services.  Please see initial visit note for detailed documentation.   Patient Care Team: Lillard Anes, MD as PCP - General (Family Medicine) Burnice Logan, [facility] as Pharmacist (Pharmacist)  Recent office visits: 06/2020 - AWV with Dr. Henrene Pastor.   Recent consult visits: *** 10/09/2020 - Cardio -  Stable maintaining sinus rhythm. Follow-up in 1 year.  Hospital visits: {Hospital DC Yes/No:25215}  Objective:  Lab Results  Component Value Date   CREATININE 1.14 (H) 10/09/2020   BUN 18 10/09/2020   GFRNONAA 62 11/11/2019   GFRAA 72 11/11/2019   NA 141 10/09/2020   K 4.3 10/09/2020   CALCIUM 9.1 10/09/2020   CO2 22 10/09/2020    No results found for: HGBA1C, FRUCTOSAMINE, GFR, MICROALBUR  Last diabetic Eye exam: No results found for: HMDIABEYEEXA  Last diabetic Foot exam: No results found for: HMDIABFOOTEX   Lab Results  Component Value Date   CHOL 149 10/09/2020   HDL 41 10/09/2020   LDLCALC 93 10/09/2020   TRIG 75 10/09/2020   CHOLHDL 3.6 10/09/2020    Hepatic Function Latest Ref Rng & Units 10/09/2020 11/11/2019 09/27/2019  Total Protein 6.0 - 8.5 g/dL 7.1 7.1 7.0  Albumin 3.6 - 4.6 g/dL 4.0 4.0 3.7  AST 0 - 40 IU/L 28 33 34  ALT 0 - 32 IU/L 27 32 35(H)  Alk Phosphatase 44 - 121 IU/L 75 95 104  Total Bilirubin 0.0 - 1.2 mg/dL  0.4 0.5 0.4    Lab Results  Component Value Date/Time   TSH 3.540 10/09/2020 01:26 PM   TSH 3.310 09/27/2019 03:57 PM   FREET4 1.45 10/09/2020 01:26 PM    CBC Latest Ref Rng & Units 11/11/2019 07/23/2017 07/06/2017  WBC 3.4 - 10.8 x10E3/uL 7.8 8.9 11.2(H)  Hemoglobin 11.1 - 15.9 g/dL 14.0 14.7 13.2  Hematocrit 34.0 - 46.6 % 41.4 44.5 40.0  Platelets 150 - 450 x10E3/uL 147(L) 190 172    No results found for: VD25OH  Clinical ASCVD: {YES/NO:21197} The ASCVD Risk score Mikey Bussing DC Jr., et al., 2013) failed to calculate for the following reasons:   The 2013 ASCVD risk score is only valid for ages 92 to 33    Depression screen PHQ 2/9 06/01/2020 12/16/2019  Decreased Interest 0 1  Down, Depressed, Hopeless 0 1  PHQ - 2 Score 0 2  Altered sleeping - 3  Tired, decreased energy - 3  Change in appetite - 0  Feeling bad or failure about yourself  - 1  Trouble concentrating - 1  Moving slowly or fidgety/restless - 1  Suicidal thoughts - 0  PHQ-9 Score - 11  Difficult doing work/chores - Somewhat difficult     ***Other: (CHADS2VASc if Afib, MMRC or CAT for COPD, ACT, DEXA)  Social History   Tobacco Use  Smoking Status Never Smoker  Smokeless Tobacco Never Used   BP  Readings from Last 3 Encounters:  10/09/20 134/72  06/01/20 122/60  11/26/19 114/70   Pulse Readings from Last 3 Encounters:  10/09/20 63  06/01/20 76  11/26/19 61   Wt Readings from Last 3 Encounters:  10/09/20 180 lb 3.2 oz (81.7 kg)  06/01/20 178 lb 6.4 oz (80.9 kg)  11/26/19 178 lb (80.7 kg)    Assessment/Interventions: Review of patient past medical history, allergies, medications, health status, including review of consultants reports, laboratory and other test data, was performed as part of comprehensive evaluation and provision of chronic care management services.   SDOH:  (Social Determinants of Health) assessments and interventions performed: {yes/no:20286}   CCM Care Plan  No Known  Allergies  Medications Reviewed Today    Reviewed by Toni Arthurs, CMA (Certified Medical Assistant) on 10/09/20 at 48  Med List Status: <None>  Medication Order Taking? Sig Documenting Provider Last Dose Status Informant  Acetaminophen (TYLENOL PO) [id number] Yes Take 500 mg by mouth at bedtime.  [provider] Taking Active Self  ALPRAZolam Duanne Moron) 0.25 MG tablet [id number] Yes TAKE 1 TABLET(0.25 MG) BY MOUTH TWICE DAILY AS NEEDED FOR ANXIETY Lillard Anes, MD Taking Active   amiodarone (PACERONE) 200 MG tablet [id number] Yes TAKE 1 TABLET BY MOUTH EVERY DAY Bettina Gavia Hilton Cork, MD Taking Active   apixaban (ELIQUIS) 5 MG TABS tablet [id number] Yes Take 1 tablet (5 mg total) by mouth 2 (two) times daily. Richardo Priest, MD Taking Active   atorvastatin (LIPITOR) 20 MG tablet [id number] Yes TAKE 1 TABLET(20 MG) BY MOUTH DAILY Lillard Anes, MD Taking Active   Budeson-Glycopyrrol-Formoterol 160-9-4.8 MCG/ACT AERO [id number] Yes Inhale 2 puffs into the lungs daily. [provider] Taking Active   calcium carbonate (OSCAL) 1500 (600 Ca) MG TABS tablet [id number] Yes Take by mouth 2 (two) times daily with a meal. [provider] Taking Active   carvedilol (COREG) 3.125 MG tablet [id number] Yes TAKE 1 TABLET TWICE DAILY WITH MEALS. Richardo Priest, MD Taking Active   docusate sodium (COLACE) 250 MG capsule [id number] Yes Take 250 mg by mouth daily as needed for constipation. [provider] Taking Active   furosemide (LASIX) 20 MG tablet [id number] Yes TAKE 1 TABLET BY MOUTH THREE TIMES A WEEK, TAKE ANY DAY YOU WEIGH OVER 173 POUNDS OR LESS Bettina Gavia Hilton Cork, MD Taking Active   loratadine (CLARITIN) 10 MG tablet [id number] Yes Take 10 mg by mouth daily as needed for allergies. [provider] Taking Active   Multiple Vitamins-Minerals (CENTRUM SILVER 50+WOMEN PO) [id number] Yes Take 1 tablet by mouth daily. [provider] Taking Active    saccharomyces boulardii (FLORASTOR) 250 MG capsule [id number] Yes Take 250 mg by mouth 2 (two) times daily. [provider] Taking Active   TURMERIC CURCUMIN PO [id number] Yes Take 1 tablet by mouth at bedtime. [provider] Taking Active           Patient Active Problem List   Diagnosis Date Noted  . PONV (postoperative nausea and vomiting)   . Hyperlipidemia   . HTN (hypertension)   . Depression   . Routine general medical examination at a health care facility 06/01/2020  . GAD (generalized anxiety disorder) 11/26/2019  . Stye 11/26/2019  . Mixed hyperlipidemia 11/11/2019  . On amiodarone therapy 07/27/2017  . Persistent atrial fibrillation ([place])   . Atypical atrial flutter ([place]) 07/06/2017  . Tricuspid regurgitation 06/19/2017  . Chronic anticoagulation 06/19/2017  . Chronic combined systolic and diastolic heart  failure ([place]) 06/12/2017  . Pleural effusion on right 06/12/2017  . Hyponatremia 06/12/2017  . Transaminitis 06/12/2017  . Hypertensive heart disease with heart failure ([place]) 06/12/2017  . COPD (chronic obstructive pulmonary disease) ([place]) 06/12/2017    Immunization History  Administered Date(s) Administered  . Influenza, High Dose Seasonal PF 04/22/2017  . PFIZER(Purple Top)SARS-COV-2 Vaccination 08/16/2019, 09/09/2019, 04/28/2020    Conditions to be addressed/monitored:  Hypertension, Hyperlipidemia, Atrial Fibrillation, Heart Failure, COPD and Depression  There are no care plans that you recently modified to display for this patient.    Medication Assistance: Breztriobtained through [place] [employer] medication assistance program.  Enrollment ends 07/31/2021  Patient's preferred pharmacy is:  [facility] [place], [place] - [address] AT [address] [place] [address] Phone: [phone] Fax: [phone]  Uses pill box? {Yes or If no, why not?:20788} Pt endorses ***% compliance  We  discussed: Benefits of medication synchronization, packaging and delivery as well as enhanced pharmacist oversight with [employer]. Patient decided to: Continue current medication management strategy  Care Plan and Follow Up Patient Decision:  Patient agrees to Care Plan and Follow-up.  Plan: {CM FOLLOW UP PLAN:25073}  ***      Current Barriers:  . Unable to independently afford treatment regimen . ***  Pharmacist Clinical Goal(s):  Marland Kitchen Over the next *** days, patient will {PHARMACYGOALCHOICES:24921} through collaboration with PharmD and provider.  . ***  Interventions: . 1:1 collaboration with Lillard Anes, MD regarding development and update of comprehensive plan of care as evidenced by provider attestation and co-signature . Inter-disciplinary care team collaboration (see longitudinal plan of care) . Comprehensive medication review performed; medication list updated in electronic medical record  Hyperlipidemia: (LDL goal < ***) -{US controlled/uncontrolled:25276} -Current treatment: . Atorvastatin 20 mg daily  -Medications previously tried: ***  -Current dietary patterns: *** -Current exercise habits: *** -Educated on {CCM HLD Counseling:25126} -{CCMPHARMDINTERVENTION:25122}  Atrial Fibrillation (Goal: prevent stroke and major bleeding) -{US controlled/uncontrolled:25276} -CHADSVASC: *** -Current treatment: . Rate control: amiodarone 200 mg daily, carvedilol 3.125 mg bid  . Anticoagulation: ***Eliquis 5 mg bid  -Medications previously tried: *** -Home BP and HR readings: ***  -Counseled on {CCMAFIBCOUNSELING:25120} -{CCMPHARMDINTERVENTION:25122}  Heart Failure (Goal: manage symptoms and prevent exacerbations) -{US controlled/uncontrolled:25276} -Last ejection fraction: *** (Date: ***) -HF type: {type of heart failure:30421350} -NYHA Class: {CHL HP [facility] Pharm NYHA Class:[phone]} -AHA HF Stage: {CHL HP [facility] Pharm AHA HF [phone]} -Current  treatment: . ***carvedilol 3.125 mg bid  . Furosemide 20 mg three times a week  -Medications previously tried: ***  -Current home BP/HR readings: *** -Current dietary habits: *** -Current exercise habits: *** -Educated on {CCM HF Counseling:25125} -{CCMPHARMDINTERVENTION:25122}  COPD (Goal: control symptoms and prevent exacerbations) -{US controlled/uncontrolled:25276} -Current treatment  . ***Breztri 2 puffs twice daily  -Medications previously tried: ***  -Gold Grade: {CHL HP [facility] Pharm COPD Gold WYOVZ:[id number]} -Current COPD Classification:  {CHL HP [facility] Pharm COPD Classification:[phone]} -MMRC/CAT score: *** -Pulmonary function testing: *** -Exacerbations requiring treatment in last 6 months: *** -Patient {Actions; denies-reports:120008} consistent use of maintenance inhaler -Frequency of rescue inhaler use: *** -Counseled on {CCMINHALERCOUNSELING:25121} -{CCMPHARMDINTERVENTION:25122}  Depression/Anxiety (Goal: ***) -{US controlled/uncontrolled:25276} -Current treatment: . ***alprazolam 0.25 mg bid prn anxiety -Medications previously tried/failed: *** -PHQ9: *** -GAD7: *** -Connected with *** for mental health support -Educated on {CCM mental health counseling:25127} -{CCMPHARMDINTERVENTION:25122}  Osteoporosis / Osteopenia (Goal ***) -{US controlled/uncontrolled:25276} -Last DEXA Scan: ***   T-Score femoral neck: ***  T-Score total  hip: ***  T-Score lumbar spine: ***  T-Score forearm radius: ***  10-year probability of major osteoporotic fracture: ***  10-year probability of hip fracture: *** -Patient {is;is not an osteoporosis candidate:23886} -Current treatment  . *** calcium carbonate bid with a meal  -Medications previously tried: ***  -{Osteoporosis Counseling:23892} -{CCMPHARMDINTERVENTION:25122}   Patient Goals/Self-Care Activities . Over the next *** days, patient will:  - {pharmacypatientgoals:24919}  Follow Up Plan: {CM FOLLOW UP  ZLDJ:57017}

## 2020-10-12 NOTE — Telephone Encounter (Signed)
-----   Message from Richardo Priest, MD sent at 10/10/2020  8:55 AM EST ----- All results are good, no changes

## 2020-10-16 ENCOUNTER — Telehealth: Payer: PPO

## 2020-10-16 DIAGNOSIS — H2703 Aphakia, bilateral: Secondary | ICD-10-CM | POA: Diagnosis not present

## 2020-10-19 ENCOUNTER — Encounter: Payer: Self-pay | Admitting: Legal Medicine

## 2020-10-19 ENCOUNTER — Other Ambulatory Visit: Payer: Self-pay

## 2020-10-19 ENCOUNTER — Ambulatory Visit (INDEPENDENT_AMBULATORY_CARE_PROVIDER_SITE_OTHER): Payer: PPO | Admitting: Legal Medicine

## 2020-10-19 ENCOUNTER — Ambulatory Visit (INDEPENDENT_AMBULATORY_CARE_PROVIDER_SITE_OTHER): Payer: PPO

## 2020-10-19 VITALS — BP 130/70 | HR 54 | Temp 95.7°F | Resp 16 | Ht 65.0 in | Wt 183.0 lb

## 2020-10-19 DIAGNOSIS — Z79899 Other long term (current) drug therapy: Secondary | ICD-10-CM | POA: Diagnosis not present

## 2020-10-19 DIAGNOSIS — E782 Mixed hyperlipidemia: Secondary | ICD-10-CM

## 2020-10-19 DIAGNOSIS — I110 Hypertensive heart disease with heart failure: Secondary | ICD-10-CM

## 2020-10-19 DIAGNOSIS — I5042 Chronic combined systolic (congestive) and diastolic (congestive) heart failure: Secondary | ICD-10-CM | POA: Diagnosis not present

## 2020-10-19 DIAGNOSIS — Z780 Asymptomatic menopausal state: Secondary | ICD-10-CM

## 2020-10-19 DIAGNOSIS — M810 Age-related osteoporosis without current pathological fracture: Secondary | ICD-10-CM

## 2020-10-19 DIAGNOSIS — J449 Chronic obstructive pulmonary disease, unspecified: Secondary | ICD-10-CM

## 2020-10-19 DIAGNOSIS — F411 Generalized anxiety disorder: Secondary | ICD-10-CM

## 2020-10-19 DIAGNOSIS — I4819 Other persistent atrial fibrillation: Secondary | ICD-10-CM | POA: Diagnosis not present

## 2020-10-19 NOTE — Progress Notes (Signed)
Chronic Care Management Pharmacy Note  10/19/2020 Name:  Stephanie Douglas MRN:  [id number] DOB:  10-30-1933  Subjective: Stephanie Douglas is an 87 y.o. year old female who is a primary patient of Henrene Pastor, Zeb Comfort, MD.  The CCM team was consulted for assistance with disease management and care coordination needs.    Engaged with patient by telephone for follow up visit in response to provider referral for pharmacy case management and/or care coordination services.   Consent to Services:  The patient was given information about Chronic Care Management services, agreed to services, and gave verbal consent prior to initiation of services.  Please see initial visit note for detailed documentation.   Patient Care Team: Lillard Anes, MD as PCP - General (Family Medicine) Burnice Logan, [facility] as Pharmacist (Pharmacist)  Recent office visits: 06/2020 - AWV with Dr. Henrene Pastor.   Recent consult visits: 10/09/2020 - Cardio -  Stable maintaining sinus rhythm. Follow-up in 1 year.  Hospital visits: None in previous 6 months  Objective:  Lab Results  Component Value Date   CREATININE 1.14 (H) 10/09/2020   BUN 18 10/09/2020   GFRNONAA 62 11/11/2019   GFRAA 72 11/11/2019   NA 141 10/09/2020   K 4.3 10/09/2020   CALCIUM 9.1 10/09/2020   CO2 22 10/09/2020    No results found for: HGBA1C, FRUCTOSAMINE, GFR, MICROALBUR  Last diabetic Eye exam: No results found for: HMDIABEYEEXA  Last diabetic Foot exam: No results found for: HMDIABFOOTEX   Lab Results  Component Value Date   CHOL 149 10/09/2020   HDL 41 10/09/2020   LDLCALC 93 10/09/2020   TRIG 75 10/09/2020   CHOLHDL 3.6 10/09/2020    Hepatic Function Latest Ref Rng & Units 10/09/2020 11/11/2019 09/27/2019  Total Protein 6.0 - 8.5 g/dL 7.1 7.1 7.0  Albumin 3.6 - 4.6 g/dL 4.0 4.0 3.7  AST 0 - 40 IU/L 28 33 34  ALT 0 - 32 IU/L 27 32 35(H)  Alk Phosphatase 44 - 121 IU/L 75 95 104  Total Bilirubin 0.0 - 1.2 mg/dL 0.4 0.5  0.4    Lab Results  Component Value Date/Time   TSH 3.540 10/09/2020 01:26 PM   TSH 3.310 09/27/2019 03:57 PM   FREET4 1.45 10/09/2020 01:26 PM    CBC Latest Ref Rng & Units 11/11/2019 07/23/2017 07/06/2017  WBC 3.4 - 10.8 x10E3/uL 7.8 8.9 11.2(H)  Hemoglobin 11.1 - 15.9 g/dL 14.0 14.7 13.2  Hematocrit 34.0 - 46.6 % 41.4 44.5 40.0  Platelets 150 - 450 x10E3/uL 147(L) 190 172    No results found for: VD25OH  Clinical ASCVD: No  The ASCVD Risk score Mikey Bussing DC Jr., et al., 2013) failed to calculate for the following reasons:   The 2013 ASCVD risk score is only valid for ages 43 to 76    Depression screen PHQ 2/9 06/01/2020 12/16/2019  Decreased Interest 0 1  Down, Depressed, Hopeless 0 1  PHQ - 2 Score 0 2  Altered sleeping - 3  Tired, decreased energy - 3  Change in appetite - 0  Feeling bad or failure about yourself  - 1  Trouble concentrating - 1  Moving slowly or fidgety/restless - 1  Suicidal thoughts - 0  PHQ-9 Score - 11  Difficult doing work/chores - Somewhat difficult     CHA2DS2-VASc Score = 5  Social History   Tobacco Use  Smoking Status Never Smoker  Smokeless Tobacco Never Used   BP Readings from Last 3 Encounters:  10/19/20 130/70  10/09/20 134/72  06/01/20 122/60   Pulse Readings from Last 3 Encounters:  10/19/20 (!) 54  10/09/20 63  06/01/20 76   Wt Readings from Last 3 Encounters:  10/19/20 183 lb (83 kg)  10/09/20 180 lb 3.2 oz (81.7 kg)  06/01/20 178 lb 6.4 oz (80.9 kg)    Assessment/Interventions: Review of patient past medical history, allergies, medications, health status, including review of consultants reports, laboratory and other test data, was performed as part of comprehensive evaluation and provision of chronic care management services.   SDOH:  (Social Determinants of Health) assessments and interventions performed: Yes   CCM Care Plan  No Known Allergies  Medications Reviewed Today    Reviewed by Burnice Logan, [facility]  (Pharmacist) on 10/19/20 at [id number]  Med List Status: <None>  Medication Order Taking? Sig Documenting Provider Last Dose Status Informant  Acetaminophen (TYLENOL PO) [id number] Yes Take 500 mg by mouth at bedtime.  [provider] Taking Active Self  ALPRAZolam Duanne Moron) 0.25 MG tablet [id number] Yes TAKE 1 TABLET(0.25 MG) BY MOUTH TWICE DAILY AS NEEDED FOR ANXIETY Lillard Anes, MD Taking Active   amiodarone (PACERONE) 200 MG tablet [id number] Yes TAKE 1 TABLET BY MOUTH EVERY DAY Bettina Gavia Hilton Cork, MD Taking Active   apixaban (ELIQUIS) 5 MG TABS tablet [id number] Yes Take 1 tablet (5 mg total) by mouth 2 (two) times daily. Richardo Priest, MD Taking Active   atorvastatin (LIPITOR) 20 MG tablet [id number] Yes TAKE 1 TABLET(20 MG) BY MOUTH DAILY Lillard Anes, MD Taking Active   Budeson-Glycopyrrol-Formoterol 160-9-4.8 MCG/ACT AERO [id number] Yes Inhale 2 puffs into the lungs 2 (two) times daily. [provider] Taking Active   calcium carbonate (OSCAL) 1500 (600 Ca) MG TABS tablet [id number] Yes Take by mouth 2 (two) times daily with a meal. [provider] Taking Active   carvedilol (COREG) 3.125 MG tablet [id number] Yes Take 1 tablet (3.125 mg total) by mouth 2 (two) times daily with a meal. Richardo Priest, MD Taking Active   docusate sodium (COLACE) 250 MG capsule [id number] Yes Take 250 mg by mouth daily as needed for constipation. [provider] Taking Active   furosemide (LASIX) 20 MG tablet [id number] Yes TAKE 1 TABLET BY MOUTH THREE TIMES A WEEK, TAKE ANY DAY YOU WEIGH OVER 173 POUNDS OR LESS Bettina Gavia Hilton Cork, MD Taking Active   loratadine (CLARITIN) 10 MG tablet [id number] Yes Take 10 mg by mouth daily as needed for allergies. [provider] Taking Active   Multiple Vitamins-Minerals (CENTRUM SILVER 50+WOMEN PO) [id number] Yes Take 1 tablet by mouth daily. [provider] Taking Active   saccharomyces boulardii (FLORASTOR) 250 MG capsule  [id number] Yes Take 250 mg by mouth 2 (two) times daily. [provider] Taking Active   TURMERIC CURCUMIN PO [id number] Yes Take 1 tablet by mouth at bedtime. [provider] Taking Active           Patient Active Problem List   Diagnosis Date Noted  . PONV (postoperative nausea and vomiting)   . Depression   . Routine general medical examination at a health care facility 06/01/2020  . GAD (generalized anxiety disorder) 11/26/2019  . Stye 11/26/2019  . Mixed hyperlipidemia 11/11/2019  . On amiodarone therapy 07/27/2017  . Persistent atrial fibrillation ([place])   . Tricuspid regurgitation 06/19/2017  . Chronic anticoagulation 06/19/2017  . Chronic combined systolic and diastolic heart failure ([place]) 06/12/2017  . Pleural effusion on right 06/12/2017  .  Hyponatremia 06/12/2017  . Transaminitis 06/12/2017  . Hypertensive heart disease with heart failure ([place]) 06/12/2017  . COPD (chronic obstructive pulmonary disease) ([place]) 06/12/2017    Immunization History  Administered Date(s) Administered  . Fluad Quad(high Dose 65+) 06/17/2020  . Influenza, High Dose Seasonal PF 04/22/2017  . PFIZER(Purple Top)SARS-COV-2 Vaccination 08/16/2019, 09/09/2019, 04/28/2020    Conditions to be addressed/monitored:  Hypertension, Hyperlipidemia, Atrial Fibrillation, Heart Failure, COPD and Depression  Care Plan : [place]  Updates made by Burnice Logan, RPH since 10/19/2020 12:00 AM    Problem: afib, hld, copd, chf   Priority: High  Onset Date: 10/19/2020    Long-Range Goal: Disease Management   Start Date: 10/19/2020  Expected End Date: 10/19/2021  This Visit's Progress: On track  Priority: High  Note:    Pharmacist Clinical Goal(s):  Marland Kitchen Over the next 90 days, patient will verbalize ability to afford treatment regimen through collaboration with PharmD and provider.   Interventions: . 1:1 collaboration with Lillard Anes, MD regarding development and  update of comprehensive plan of care as evidenced by provider attestation and co-signature . Inter-disciplinary care team collaboration (see longitudinal plan of care) . Comprehensive medication review performed; medication list updated in electronic medical record  Hyperlipidemia: (LDL goal < 100) -Controlled -Current treatment: . Atorvastatin 20 mg daily  -Medications previously tried: none reported  -Current dietary patterns: tries to limit sodium and high fat foods in her diet  -Current exercise habits: attends silver sneaker class 2 times each week -Educated on Cholesterol goals;  Benefits of statin for ASCVD risk reduction; Importance of limiting foods high in cholesterol; Exercise goal of 150 minutes per week; -Counseled on diet and exercise extensively Recommended to continue current medication  Atrial Fibrillation (Goal: prevent stroke and major bleeding) -Controlled -CHADSVASC: 5 -Current treatment: . Rate control: amiodarone 200 mg daily, carvedilol 3.125 mg bid  . Anticoagulation: Eliquis 5 mg bid  -Medications previously tried: none reported -Home BP and HR readings: well controlled per patient - does not have readings present  -Counseled on increased risk of stroke due to Afib and benefits of anticoagulation for stroke prevention; importance of adherence to anticoagulant exactly as prescribed; seeking medical attention after a head injury or if there is blood in the urine/stool; -Recommended to continue current medication Assessed patient finances. Patient will let pharmacist know when she has spent ~3% of income on medication to renew Eliquis form for 2022 patient assistance.   Heart Failure (Goal: manage symptoms and prevent exacerbations) -Controlled -Last ejection fraction: 35-40% -HF type: Hypertensive Heart disease with heart failure -Current treatment: . carvedilol 3.125 mg bid  . Furosemide 20 mg three times a week  -Medications previously tried: none  reported  -Current home BP/HR readings: well controlled per patietn  -Current dietary habits: watches salt intake and tries to eat healthy -Current exercise habits: silver sneakers class twice weekly -Educated on Benefits of medications for managing symptoms and prolonging life Importance of weighing daily; if you gain more than 3 pounds in one day or 5 pounds in one week,   Importance of blood pressure control -Counseled on diet and exercise extensively Recommended to continue current medication  COPD (Goal: control symptoms and prevent exacerbations) -Controlled -Current treatment  . 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 Assisted patient with insurance form to report patient assistance from [place] [employer] [place]. Pharmacist faxed form from office 10/19/2020.     Patient Goals/Self-Care Activities . Over the next 90 days, patient will:  - take medications as prescribed focus on medication adherence by using pill box check blood pressure daily , document, and provide at future appointments collaborate with provider on medication access solutions target a minimum of 150 minutes of moderate intensity exercise weekly engage in dietary modifications by limiting sodium and fat intake   Follow Up Plan: Telephone follow up appointment with care management team member scheduled for: 01/2021       Medication Assistance: Breztriobtained through [place] [employer] [place] medication assistance program.  Enrollment ends 07/31/2021. Pharmacist will submit Eliquis application for 1700 once 3% of income spent on medications. Patient aware to let pharmacist know when this is met.   Patient's  preferred pharmacy is:  [id number], [place] - [address] [place] [address] Phone: [phone] Fax: [phone]  Uses pill box? Yes Pt endorses good compliance  We discussed: Benefits of medication synchronization, packaging and delivery as well as enhanced pharmacist oversight with [employer]. Patient decided to: Continue current medication management strategy  Care Plan and Follow Up Patient Decision:  Patient agrees to Care Plan and Follow-up.  Plan: Telephone follow up appointment with care management team member scheduled for:  02/15/2021

## 2020-10-19 NOTE — Assessment & Plan Note (Signed)
Normal EF 10/19/2020

## 2020-10-19 NOTE — Progress Notes (Signed)
Subjective:  Patient ID: Stephanie Douglas, female    DOB: [birth year]  Age: 87 y.o. MRN: [id number]  Chief Complaint  Patient presents with  . Hyperlipidemia  . Anxiety    HPI: chronic visit  Patient presents for follow up of hypertension.  Patient tolerating carvedilol well with side effects.  Patient was diagnosed with hypertension 2010 so has been treated for hypertension for 10 years.Patient is working on maintaining diet and exercise regimen and follows up as directed. Complication include CHF  Patient presents with hyperlipidemia.  Compliance with treatment has been good; patient takes medicines as directed, maintains low cholesterol diet, follows up as directed, and maintains exercise regimen.  Patient is using atorvastin without problems..  Patient presents with HFpEF  that is stable. Diagnosis made 2020.  The course of the disease is stable.  Current medicines include furosemide, coreg, Eliquis, amiodarone. Patient follows a low cholesterol diet and maintains a weight diary.  Patient is on low salt, low cholesterol diet and avoids alcohol.  Patient denies adverse effects of medicines. Patient is monitoring weight and has gained 2 lbs of weight.  Patient is having no pedal edema, no PND and no PND.  Patient is continuing to see cardiology.   Current Outpatient Medications on File Prior to Visit  Medication Sig Dispense Refill  . Acetaminophen (TYLENOL PO) Take 500 mg by mouth at bedtime.     . ALPRAZolam (XANAX) 0.25 MG tablet TAKE 1 TABLET(0.25 MG) BY MOUTH TWICE DAILY AS NEEDED FOR ANXIETY 60 tablet 3  . amiodarone (PACERONE) 200 MG tablet TAKE 1 TABLET BY MOUTH EVERY DAY 90 tablet 0  . apixaban (ELIQUIS) 5 MG TABS tablet Take 1 tablet (5 mg total) by mouth 2 (two) times daily. 180 tablet 2  . atorvastatin (LIPITOR) 20 MG tablet TAKE 1 TABLET(20 MG) BY MOUTH DAILY 90 tablet 2  . Budeson-Glycopyrrol-Formoterol 160-9-4.8 MCG/ACT AERO Inhale 2 puffs into the lungs 2 (two) times  daily.    . calcium carbonate (OSCAL) 1500 (600 Ca) MG TABS tablet Take by mouth 2 (two) times daily with a meal.    . carvedilol (COREG) 3.125 MG tablet Take 1 tablet (3.125 mg total) by mouth 2 (two) times daily with a meal. 180 tablet 3  . docusate sodium (COLACE) 250 MG capsule Take 250 mg by mouth daily as needed for constipation.    . furosemide (LASIX) 20 MG tablet TAKE 1 TABLET BY MOUTH THREE TIMES A WEEK, TAKE ANY DAY YOU WEIGH OVER 173 POUNDS OR LESS 90 tablet 3  . loratadine (CLARITIN) 10 MG tablet Take 10 mg by mouth daily as needed for allergies.    . Multiple Vitamins-Minerals (CENTRUM SILVER 50+WOMEN PO) Take 1 tablet by mouth daily.    Marland Kitchen saccharomyces boulardii (FLORASTOR) 250 MG capsule Take 250 mg by mouth 2 (two) times daily.    . TURMERIC CURCUMIN PO Take 1 tablet by mouth at bedtime.     No current facility-administered medications on file prior to visit.   Past Medical History:  Diagnosis Date  . COPD (chronic obstructive pulmonary disease) ([place])   . Depression   . HTN (hypertension)   . Hyperlipidemia   . PONV (postoperative nausea and vomiting)   . PONV (postoperative nausea and vomiting)    Past Surgical History:  Procedure Laterality Date  . ABDOMINAL HYSTERECTOMY    . CARDIOVERSION N/A 06/13/2017   Procedure: CARDIOVERSION;  Surgeon: Lelon Perla, MD;  Location: [facility] ENDOSCOPY;  Service: Cardiovascular;  Laterality:  N/A;  . CARDIOVERSION N/A 07/07/2017   Procedure: CARDIOVERSION;  Surgeon: Sanda Klein, MD;  Location: [facility] ENDOSCOPY;  Service: Cardiovascular;  Laterality: N/A;  . TEE WITHOUT CARDIOVERSION N/A 06/13/2017   Procedure: TRANSESOPHAGEAL ECHOCARDIOGRAM (TEE);  Surgeon: Lelon Perla, MD;  Location: [facility] ENDOSCOPY;  Service: Cardiovascular;  Laterality: N/A;    Family History  Problem Relation Age of Onset  . CAD Mother   . Cardiomyopathy Mother   . Diabetes Mother   . Asthma Father   . Breast cancer Sister   . Diabetes Brother   .  Hypertension Brother   . AAA (abdominal aortic aneurysm) Sister    Social History   Socioeconomic History  . Marital status: Widowed    Spouse name: Not on file  . Number of children: Not on file  . Years of education: Not on file  . Highest education level: Not on file  Occupational History  . Not on file  Tobacco Use  . Smoking status: Never Smoker  . Smokeless tobacco: Never Used  Vaping Use  . Vaping Use: Never used  Substance and Sexual Activity  . Alcohol use: No  . Drug use: No  . Sexual activity: Not Currently  Other Topics Concern  . Not on file  Social History Narrative  . Not on file   Social Determinants of Health   Financial Resource Strain: Low Risk   . Difficulty of Paying Living Expenses: Not hard at all  Food Insecurity: No Food Insecurity  . Worried About Charity fundraiser in the Last Year: Never true  . Ran Out of Food in the Last Year: Never true  Transportation Needs: No Transportation Needs  . Lack of Transportation (Medical): No  . Lack of Transportation (Non-Medical): No  Physical Activity: Insufficiently Active  . Days of Exercise per Week: 5 days  . Minutes of Exercise per Session: 10 min  Stress: Stress Concern Present  . Feeling of Stress : To some extent  Social Connections: Moderately Integrated  . Frequency of Communication with Friends and Family: More than three times a week  . Frequency of Social Gatherings with Friends and Family: More than three times a week  . Attends Religious Services: More than 4 times per year  . Active Member of Clubs or Organizations: Yes  . Attends Archivist Meetings: 1 to 4 times per year  . Marital Status: Widowed    Review of Systems  Constitutional: Negative for activity change and appetite change.  HENT: Negative for congestion and sinus pain.   Eyes: Negative for photophobia and visual disturbance.  Respiratory: Negative for chest tightness and shortness of breath.   Cardiovascular:  Negative for chest pain, palpitations and leg swelling.  Gastrointestinal: Negative for abdominal distention and abdominal pain.  Endocrine: Negative for polyuria.  Genitourinary: Negative for difficulty urinating and urgency.  Musculoskeletal: Negative for arthralgias and back pain.  Skin: Negative.   Neurological: Negative.   Psychiatric/Behavioral: Negative.      Objective:  BP 130/70 (BP Location: Right Arm, Patient Position: Sitting, Cuff Size: Normal)   Pulse (!) 54   Temp (!) 95.7 F (35.4 C) (Temporal)   Resp 16   Ht 5\' 5"  (1.651 m)   Wt 183 lb (83 kg)   SpO2 95%   BMI 30.45 kg/m   BP/Weight 10/19/2020 10/09/2020 09/05/971  Systolic BP 532 992 426  Diastolic BP 70 72 60  Wt. (Lbs) 183 180.2 178.4  BMI 30.45 29.99 29.69  Physical Exam Vitals reviewed.  Constitutional:      Appearance: Normal appearance.  HENT:     Head: Normocephalic.     Right Ear: Tympanic membrane, ear canal and external ear normal.     Left Ear: Tympanic membrane and ear canal normal.     Mouth/Throat:     Mouth: Mucous membranes are dry.     Pharynx: Oropharynx is clear.  Eyes:     Extraocular Movements: Extraocular movements intact.     Conjunctiva/sclera: Conjunctivae normal.     Pupils: Pupils are equal, round, and reactive to light.  Cardiovascular:     Rate and Rhythm: Normal rate and regular rhythm.     Pulses: Normal pulses.     Heart sounds: Normal heart sounds. No murmur heard. No gallop.   Pulmonary:     Effort: Pulmonary effort is normal.     Breath sounds: Normal breath sounds. No rales.  Abdominal:     General: Abdomen is flat. Bowel sounds are normal. There is no distension.     Palpations: Abdomen is soft.     Tenderness: There is no abdominal tenderness.  Musculoskeletal:        General: No tenderness. Normal range of motion.     Cervical back: Normal range of motion and neck supple.  Skin:    General: Skin is warm and dry.     Capillary Refill: Capillary  refill takes less than 2 seconds.  Neurological:     General: No focal deficit present.     Mental Status: She is alert and oriented to person, place, and time.  Psychiatric:        Mood and Affect: Mood normal.       Lab Results  Component Value Date   WBC 7.8 11/11/2019   HGB 14.0 11/11/2019   HCT 41.4 11/11/2019   PLT 147 (L) 11/11/2019   GLUCOSE 101 (H) 10/09/2020   CHOL 149 10/09/2020   TRIG 75 10/09/2020   HDL 41 10/09/2020   LDLCALC 93 10/09/2020   ALT 27 10/09/2020   AST 28 10/09/2020   NA 141 10/09/2020   K 4.3 10/09/2020   CL 106 10/09/2020   CREATININE 1.14 (H) 10/09/2020   BUN 18 10/09/2020   CO2 22 10/09/2020   TSH 3.540 10/09/2020      Assessment & Plan:   1. Age-related osteoporosis without current pathological fracture Patient is on vitamin D and calcium, I requested more recent DXA, patient may do better with prolia.  2. Hypertensive heart disease with heart failure (HCC) - CBC with Differential/Platelet - Comprehensive metabolic panel An individual hypertension care plan was established and reinforced today.  The patient's status was assessed using clinical findings on exam and labs or diagnostic tests. The patient's success at meeting treatment goals on disease specific evidence-based guidelines and found to be fair controlled. SELF MANAGEMENT: The patient and I together assessed ways to personally work towards obtaining the recommended goals. RECOMMENDATIONS: avoid decongestants found in common cold remedies, decrease consumption of alcohol, perform routine monitoring of BP with home BP cuff, exercise, reduction of dietary salt, take medicines as prescribed, try not to miss doses and quit smoking.  Regular exercise and maintaining a healthy weight is needed.  Stress reduction may help. A CLINICAL SUMMARY including written plan identify barriers to care unique to individual due to social or financial issues.  We attempt to mutually creat solutions for  individual and family understanding.  3. Mixed hyperlipidemia - Lipid panel  AN INDIVIDUAL CARE PLAN for hyperlipidemia/ cholesterol was established and reinforced today.  The patient's status was assessed using clinical findings on exam, lab and other diagnostic tests. The patient's disease status was assessed based on evidence-based guidelines and found to be fair controlled. MEDICATIONS were reviewed. SELF MANAGEMENT GOALS have been discussed and patient's success at attaining the goal of low cholesterol was assessed. RECOMMENDATION given include regular exercise 3 days a week and low cholesterol/low fat diet. CLINICAL SUMMARY including written plan to identify barriers unique to the patient due to social or economic  reasons was discussed.  4. GAD (generalized anxiety disorder) AN INDIVIDUAL CARE PLAN  For GAD was established and reinforced today.  The patient's status was assessed using clinical findings on exam, labs, and other diagnostic testing. Patient's success at meeting treatment goals based on disease specific evidence-bassed guidelines and found to be in good control. RECOMMENDATIONS include maintaining present medicines and treatment.  5. Chronic combined systolic and diastolic heart failure ([place]) An individualized care plan was established and reinforced.  The patient's disease status was assessed using clinical finding son exam today, labs, and/or other diagnostic testing such as x-rays, to determine the patient's success in meeting treatmentgoalsbased on disease-based guidelines and found to beimproving. But not at goal yet. Medications prescriptions no changes Laboratory tests ordered to be performed today include routine. RECOMMENDATIONS: given include see cardiology.  Call physician is patient gains 3 lbs in one day or 5 lbs for one week.  Call for progressive PND, orthopnea or increased pedal edema. Last EF was normal.  6. Chronic obstructive pulmonary disease, unspecified  COPD type [facility]) An individualize plan was formulated for care of COPD.  Treatment is evidence based.  She will continue on inhalers, avoid smoking and smoke.  Regular exercise with help with dyspnea. Routine follow ups and medication compliance is needed.  7. Persistent atrial fibrillation [facility]) Patient has a diagnosis of paroxysmal atrial fibrillation.   Patient is on eliquis and has controlled ventricular response.  Patient is CV stable.  8. On amiodarone therapy Patient on high risk medicine, needs close monitoring  9. Post-menopausal - DG DXA BODY COMPOSITION Ordered DXA for update on osteoporosis      Orders Placed This Encounter  Procedures  . DG DXA BODY COMPOSITION  . CBC with Differential/Platelet  . Comprehensive metabolic panel  . Lipid panel      I spent 30 minutes dedicated to the care of this patient on the date of this encounter to include face-to-face time with the patient, as well as: review cardiac rexords  Follow-up: Return in about 3 months (around 01/19/2021) for fasting.  An After Visit Summary was printed and given to the patient.  [name], MD [facility] [phone]

## 2020-10-19 NOTE — Patient Instructions (Addendum)
Visit Information  Goals Addressed            This Visit's Progress   . Learn More About My Health       Timeframe:  Long-Range Goal Priority:  High Start Date:       10/19/2020                      Expected End Date:    10/19/2021                    Follow Up Date 02/15/2021    - tell my story and reason for my visit - ask questions - repeat what I heard to make sure I understand - bring a list of my medicines to the visit - speak up when I don't understand    Why is this important?    The best way to learn about your health and care is by talking to the doctor and nurse.   They will answer your questions and give you information in the way that you like best.    Notes:     Marland Kitchen Manage My Medicine       Timeframe:  Long-Range Goal Priority:  High Start Date:       10/19/2020                      Expected End Date:    10/19/2021                   Follow Up Date 02/15/2021    - learn to read medicine labels - use a pillbox to sort medicine - use an alarm clock or phone to remind me to take my medicine    Why is this important?   . These steps will help you keep on track with your medicines.   Notes:     . Track and Manage Symptoms-Heart Failure       Timeframe:  Long-Range Goal Priority:  High Start Date:           10/19/2020                  Expected End Date:   10/19/2021                  Follow Up Date 02/15/2021    - eat more whole grains, fruits and vegetables, lean meats and healthy fats - know when to call the doctor - track symptoms and what helps feel better or worse    Why is this important?    You will be able to handle your symptoms better if you keep track of them.   Making some simple changes to your lifestyle will help.   Eating healthy is one thing you can do to take good care of yourself.    Notes:       Patient Care Plan: CCM Pharmacy Care Plan    Problem Identified: afib, hld, copd, chf   Priority: High  Onset Date: 10/19/2020     Long-Range Goal: Disease Management   Start Date: 10/19/2020  Expected End Date: 10/19/2021  This Visit's Progress: On track  Priority: High  Note:    Pharmacist Clinical Goal(s):  Marland Kitchen Over the next 90 days, patient will verbalize ability to afford treatment regimen through collaboration with PharmD and provider.   Interventions: . 1:1 collaboration with Lillard Anes, MD regarding development and update of comprehensive plan of  care as evidenced by provider attestation and co-signature . Inter-disciplinary care team collaboration (see longitudinal plan of care) . Comprehensive medication review performed; medication list updated in electronic medical record  Hyperlipidemia: (LDL goal < 100) -Controlled -Current treatment: . Atorvastatin 20 mg daily  -Medications previously tried: none reported  -Current dietary patterns: tries to limit sodium and high fat foods in her diet  -Current exercise habits: attends silver sneaker class 2 times each week -Educated on Cholesterol goals;  Benefits of statin for ASCVD risk reduction; Importance of limiting foods high in cholesterol; Exercise goal of 150 minutes per week; -Counseled on diet and exercise extensively Recommended to continue current medication  Atrial Fibrillation (Goal: prevent stroke and major bleeding) -Controlled -CHADSVASC: 5 -Current treatment: . Rate control: amiodarone 200 mg daily, carvedilol 3.125 mg bid  . Anticoagulation: Eliquis 5 mg bid  -Medications previously tried: none reported -Home BP and HR readings: well controlled per patient - does not have readings present  -Counseled on increased risk of stroke due to Afib and benefits of anticoagulation for stroke prevention; importance of adherence to anticoagulant exactly as prescribed; seeking medical attention after a head injury or if there is blood in the urine/stool; -Recommended to continue current medication Assessed patient finances. Patient  will let pharmacist know when she has spent ~3% of income on medication to renew Eliquis form for 2022 patient assistance.   Heart Failure (Goal: manage symptoms and prevent exacerbations) -Controlled -Last ejection fraction: 35-40% -HF type: Hypertensive Heart disease with heart failure -Current treatment: . carvedilol 3.125 mg bid  . Furosemide 20 mg three times a week  -Medications previously tried: none reported  -Current home BP/HR readings: well controlled per patietn  -Current dietary habits: watches salt intake and tries to eat healthy -Current exercise habits: silver sneakers class twice weekly -Educated on Benefits of medications for managing symptoms and prolonging life Importance of weighing daily; if you gain more than 3 pounds in one day or 5 pounds in one week,   Importance of blood pressure control -Counseled on diet and exercise extensively Recommended to continue current medication  COPD (Goal: control symptoms and prevent exacerbations) -Controlled -Current treatment  . 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 Assisted patient with insurance form to report patient assistance from [place] and [place]. Pharmacist faxed form from office 10/19/2020.     Patient Goals/Self-Care Activities . Over the next 90 days, patient will:  - take medications as prescribed focus on medication adherence by using pill box check blood pressure daily , document, and provide at future appointments collaborate with provider on medication access solutions target a minimum of 150 minutes of moderate intensity exercise weekly engage in dietary modifications by limiting  sodium and fat intake   Follow Up Plan: Telephone follow up appointment with care management team member scheduled for: 01/2021       The patient verbalized understanding of instructions, educational materials, and care plan provided today and declined offer to receive copy of patient instructions, educational materials, and care plan.  Telephone follow up appointment with pharmacy team member scheduled for: 02/15/2021  Burnice Logan, [facility]  COPD and Physical Activity Chronic obstructive pulmonary disease (COPD) is a long-term (chronic) condition that affects the lungs. COPD  is a general term that can be used to describe many different lung problems that cause lung swelling (inflammation) and limit airflow, including chronic bronchitis and emphysema. The main symptom of COPD is shortness of breath, which makes it harder to do even simple tasks. This can also make it harder to exercise and be active. Talk with your health care provider about treatments to help you breathe better and actions you can take to prevent breathing problems during physical activity. What are the benefits of exercising with COPD? Exercising regularly is an important part of a healthy lifestyle. You can still exercise and do physical activities even though you have COPD. Exercise and physical activity improve your shortness of breath by increasing blood flow (circulation). This causes your heart to pump more oxygen through your body. Moderate exercise can improve your:  Oxygen use.  Energy level.  Shortness of breath.  Strength in your breathing muscles.  Heart health.  Sleep.  Self-esteem and feelings of self-worth.  Depression, stress, and anxiety levels. Exercise can benefit everyone with COPD. The severity of your disease may affect how hard you can exercise, especially at first, but everyone can benefit. Talk with your health care provider about how much exercise is safe for you, and which activities and  exercises are safe for you.   What actions can I take to prevent breathing problems during physical activity?  Sign up for a pulmonary rehabilitation program. This type of program may include: ? Education about lung diseases. ? Exercise classes that teach you how to exercise and be more active while improving your breathing. This usually involves:  Exercise using your lower extremities, such as a stationary bicycle.  About 30 minutes of exercise, 2 to 5 times per week, for 6 to 12 weeks  Strength training, such as push ups or leg lifts. ? Nutrition education. ? Group classes in which you can talk with others who also have COPD and learn ways to manage stress.  If you use an oxygen tank, you should use it while you exercise. Work with your health care provider to adjust your oxygen for your physical activity. Your resting flow rate is different from your flow rate during physical activity.  While you are exercising: ? Take slow breaths. ? Pace yourself and do not try to go too fast. ? Purse your lips while breathing out. Pursing your lips is similar to a kissing or whistling position. ? If doing exercise that uses a quick burst of effort, such as weight lifting:  Breathe in before starting the exercise.  Breathe out during the hardest part of the exercise (such as raising the weights). Where to find support You can find support for exercising with COPD from:  Your health care provider.  A pulmonary rehabilitation program.  Your local health department or community health programs.  Support groups, online or in-person. Your health care provider may be able to recommend support groups. Where to find more information You can find more information about exercising with COPD from:  American Lung Association: ClassInsider.se.  COPD Foundation: https://www.rivera.net/. Contact a health care provider if:  Your symptoms get worse.  You have chest pain.  You have nausea.  You have a  fever.  You have trouble talking or catching your breath.  You want to start a new exercise program or a new activity. Summary  COPD is a general term that can be used to describe many different lung problems that cause lung swelling (inflammation) and limit airflow.  This includes chronic bronchitis and emphysema.  Exercise and physical activity improve your shortness of breath by increasing blood flow (circulation). This causes your heart to provide more oxygen to your body.  Contact your health care provider before starting any exercise program or new activity. Ask your health care provider what exercises and activities are safe for you. This information is not intended to replace advice given to you by your health care provider. Make sure you discuss any questions you have with your health care provider. Document Revised: 11/07/2018 Document Reviewed: 08/10/2017 Elsevier Patient Education  2021 [employer].

## 2020-10-20 LAB — COMPREHENSIVE METABOLIC PANEL
ALT: 26 IU/L (ref 0–32)
AST: 27 IU/L (ref 0–40)
Albumin/Globulin Ratio: 1.5 (ref 1.2–2.2)
Albumin: 4.2 g/dL (ref 3.6–4.6)
Alkaline Phosphatase: 75 IU/L (ref 44–121)
BUN/Creatinine Ratio: 18 (ref 12–28)
BUN: 14 mg/dL (ref 8–27)
Bilirubin Total: 0.5 mg/dL (ref 0.0–1.2)
CO2: 23 mmol/L (ref 20–29)
Calcium: 9.3 mg/dL (ref 8.7–10.3)
Chloride: 103 mmol/L (ref 96–106)
Creatinine, Ser: 0.78 mg/dL (ref 0.57–1.00)
Globulin, Total: 2.8 g/dL (ref 1.5–4.5)
Glucose: 93 mg/dL (ref 65–99)
Potassium: 4.5 mmol/L (ref 3.5–5.2)
Sodium: 141 mmol/L (ref 134–144)
Total Protein: 7 g/dL (ref 6.0–8.5)
eGFR: 74 mL/min/{1.73_m2} (ref >59)

## 2020-10-20 LAB — CBC WITH DIFFERENTIAL/PLATELET
Basophils Absolute: 0.1 10*3/uL (ref 0.0–0.2)
Basos: 1 %
EOS (ABSOLUTE): 0.2 10*3/uL (ref 0.0–0.4)
Eos: 2 %
Hematocrit: 42.5 % (ref 34.0–46.6)
Hemoglobin: 14.4 g/dL (ref 11.1–15.9)
Immature Grans (Abs): 0 10*3/uL (ref 0.0–0.1)
Immature Granulocytes: 0 %
Lymphocytes Absolute: 1.4 10*3/uL (ref 0.7–3.1)
Lymphs: 20 %
MCH: 33.3 pg — ABNORMAL HIGH (ref 26.6–33.0)
MCHC: 33.9 g/dL (ref 31.5–35.7)
MCV: 98 fL — ABNORMAL HIGH (ref 79–97)
Monocytes Absolute: 0.8 10*3/uL (ref 0.1–0.9)
Monocytes: 11 %
Neutrophils Absolute: 4.6 10*3/uL (ref 1.4–7.0)
Neutrophils: 66 %
Platelets: 160 10*3/uL (ref 150–450)
RBC: 4.33 x10E6/uL (ref 3.77–5.28)
RDW: 13.3 % (ref 11.7–15.4)
WBC: 7 10*3/uL (ref 3.4–10.8)

## 2020-10-20 LAB — LIPID PANEL
Chol/HDL Ratio: 3.8 ratio (ref 0.0–4.4)
Cholesterol, Total: 156 mg/dL (ref 100–199)
HDL: 41 mg/dL (ref >39)
LDL Chol Calc (NIH): 101 mg/dL — ABNORMAL HIGH (ref 0–99)
Triglycerides: 69 mg/dL (ref 0–149)
VLDL Cholesterol Cal: 14 mg/dL (ref 5–40)

## 2020-10-20 LAB — CARDIOVASCULAR RISK ASSESSMENT

## 2020-10-20 NOTE — Progress Notes (Signed)
Cbc normal, glucose 101, kidney 3a, liver tests normal, LDL-c 101 lp

## 2020-11-03 ENCOUNTER — Telehealth: Payer: Self-pay

## 2020-11-03 ENCOUNTER — Other Ambulatory Visit: Payer: Self-pay

## 2020-11-03 MED ORDER — BUDESON-GLYCOPYRROL-FORMOTEROL 160-9-4.8 MCG/ACT IN AERO: 2.0000 | INHALATION_SPRAY | Freq: Two times a day (BID) | RESPIRATORY_TRACT | 2 refills | Status: DC

## 2020-11-03 NOTE — Telephone Encounter (Signed)
Bone Density is scheduled on 04/30/2021 at 2:00 pm arriving time at 1:30 pm. I left message on voicemail to call us back and I gave the information to her son Miryah Ralls.

## 2020-11-19 ENCOUNTER — Telehealth: Payer: Self-pay

## 2020-11-19 NOTE — Telephone Encounter (Cosign Needed)
  Chronic Care Management   Note  11/19/2020 Name: Stephanie Douglas MRN: [id number] DOB: [birth year]  Patient called inquiring about status of Eliquis patient assistance. Pharmacist requested patient find out how much she had spent on medications from the pharmacy or Explanation of Benefits report. Patient states that she has not yet met this amount but will update pharmacist once she does.   Patient is hoarse on the phone today and reports that her daughter tested her with a home COVID test and she is positive. Patient states she is coughing and congested but otherwise fine. Encouraged patient to contact office if anything changes. Patient acknowledged understanding.   Sherre Poot, PharmD, [facility] Clinical Pharmacist [facility] [phone] (office) [phone] (mobile)

## 2020-11-30 DIAGNOSIS — L570 Actinic keratosis: Secondary | ICD-10-CM | POA: Diagnosis not present

## 2020-12-01 ENCOUNTER — Telehealth: Payer: Self-pay

## 2020-12-01 NOTE — Progress Notes (Signed)
    Chronic Care Management Pharmacy Assistant   Name: Rashae Rother  MRN: [id number] DOB: 02-19-1934  Reason for Encounter: Disease State for general adherence     Recent office visits:  11/19/20-Patient daughter did home Covid test, patient is positive, she has not met 3% for assistance yet  11/03/20-Bone density scheduled for 04/30/21  10/19/20-Sara Owens Shark, CPP,   Recent consult visits:  none  Hospital visits:  None in previous 6 months  Medications: Outpatient Encounter Medications as of 12/01/2020  Medication Sig  . Acetaminophen (TYLENOL PO) Take 500 mg by mouth at bedtime.   . ALPRAZolam (XANAX) 0.25 MG tablet TAKE 1 TABLET(0.25 MG) BY MOUTH TWICE DAILY AS NEEDED FOR ANXIETY  . amiodarone (PACERONE) 200 MG tablet TAKE 1 TABLET BY MOUTH EVERY DAY  . apixaban (ELIQUIS) 5 MG TABS tablet Take 1 tablet (5 mg total) by mouth 2 (two) times daily.  Marland Kitchen atorvastatin (LIPITOR) 20 MG tablet TAKE 1 TABLET(20 MG) BY MOUTH DAILY  . Budeson-Glycopyrrol-Formoterol 160-9-4.8 MCG/ACT AERO Inhale 2 puffs into the lungs 2 (two) times daily.  . calcium carbonate (OSCAL) 1500 (600 Ca) MG TABS tablet Take by mouth 2 (two) times daily with a meal.  . carvedilol (COREG) 3.125 MG tablet Take 1 tablet (3.125 mg total) by mouth 2 (two) times daily with a meal.  . docusate sodium (COLACE) 250 MG capsule Take 250 mg by mouth daily as needed for constipation.  . furosemide (LASIX) 20 MG tablet TAKE 1 TABLET BY MOUTH THREE TIMES A WEEK, TAKE ANY DAY YOU WEIGH OVER 173 POUNDS OR LESS  . loratadine (CLARITIN) 10 MG tablet Take 10 mg by mouth daily as needed for allergies.  . Multiple Vitamins-Minerals (CENTRUM SILVER 50+WOMEN PO) Take 1 tablet by mouth daily.  Marland Kitchen saccharomyces boulardii (FLORASTOR) 250 MG capsule Take 250 mg by mouth 2 (two) times daily.  . TURMERIC CURCUMIN PO Take 1 tablet by mouth at bedtime.   No facility-administered encounter medications on file as of 12/01/2020.   Patent stated she is  having increased bruising on her legs, I advised her to call and make appointment ASAP, due to her being on Eliquis 2x day.  Patient states she is taking her medication as directed, she has no issues to note.  Patient stated she stays very active.  Patient does not watch her diet, she usually has an Ensure for lunch, today she had a peanut butter sandwich.    Patient takes her blood pressure, she has not kept up with her numbers as well as she would like, her last diary entry was November 18, 2020 it was 147/82.  Patient weight today was 173LBS.   Patient wants me to check on her Breztri and Eliquis refills.  I told her I would check on them for her. She gets through PAP  Judithann Sauger is due to arrive today before 7pm   Eliquis PAP ran out 07/31/20, we are waiting on her to meet the 3% out of pocket.  I have notified Donette Larry, CPP.    Star Rating Drugs: Atorvastatin  10/05/20  [place], [place] Pharmacist Assistant [phone]

## 2020-12-02 ENCOUNTER — Other Ambulatory Visit: Payer: Self-pay

## 2020-12-02 ENCOUNTER — Encounter: Payer: Self-pay | Admitting: Legal Medicine

## 2020-12-02 ENCOUNTER — Ambulatory Visit (INDEPENDENT_AMBULATORY_CARE_PROVIDER_SITE_OTHER): Payer: PPO | Admitting: Legal Medicine

## 2020-12-02 ENCOUNTER — Ambulatory Visit (INDEPENDENT_AMBULATORY_CARE_PROVIDER_SITE_OTHER): Payer: PPO

## 2020-12-02 VITALS — BP 124/80 | HR 87 | Temp 97.8°F | Resp 16 | Ht 65.0 in | Wt 181.0 lb

## 2020-12-02 DIAGNOSIS — D692 Other nonthrombocytopenic purpura: Secondary | ICD-10-CM | POA: Insufficient documentation

## 2020-12-02 DIAGNOSIS — Z23 Encounter for immunization: Secondary | ICD-10-CM

## 2020-12-02 DIAGNOSIS — I4819 Other persistent atrial fibrillation: Secondary | ICD-10-CM

## 2020-12-02 NOTE — Progress Notes (Signed)
   Covid-19 Vaccination Clinic  Name:  Stephanie Douglas    MRN: [id number] DOB: [birth year]  12/02/2020  [name] was observed post Covid-19 immunization for 15 minutes without incident. She was provided with Vaccine Information Sheet and instruction to access the V-Safe system.   [name] was instructed to call 911 with any severe reactions post vaccine: Marland Kitchen Difficulty breathing  . Swelling of face and throat  . A fast heartbeat  . A bad rash all over body  . Dizziness and weakness   Immunizations Administered    Name Date Dose VIS Date Route   PFIZER Comrnaty(Gray TOP) Covid-19 Vaccine 12/02/2020  2:12 PM 0.3 mL 07/09/2020 Intramuscular   Manufacturer: [employer]   Lot: [id number]   [place]: [phone]

## 2020-12-02 NOTE — Progress Notes (Signed)
Subjective:  Patient ID: Stephanie Douglas, female    DOB: 23-Sep-1933  Age: 87 y.o. MRN: [id number]  Chief Complaint  Patient presents with  . Bleeding/Bruising    Both lower legs    HPI: chronic visit  Patient is having senile purpura and is on eliquis. No aspirin. She is not having bleeding problems.       Current Outpatient Medications on File Prior to Visit  Medication Sig Dispense Refill  . Acetaminophen (TYLENOL PO) Take 500 mg by mouth at bedtime.     Marland Kitchen alendronate (FOSAMAX) 70 MG tablet Take 70 mg by mouth once a week.    . ALPRAZolam (XANAX) 0.25 MG tablet TAKE 1 TABLET(0.25 MG) BY MOUTH TWICE DAILY AS NEEDED FOR ANXIETY 60 tablet 3  . amiodarone (PACERONE) 200 MG tablet TAKE 1 TABLET BY MOUTH EVERY DAY 90 tablet 0  . apixaban (ELIQUIS) 5 MG TABS tablet Take 1 tablet (5 mg total) by mouth 2 (two) times daily. 180 tablet 2  . atorvastatin (LIPITOR) 20 MG tablet TAKE 1 TABLET(20 MG) BY MOUTH DAILY 90 tablet 2  . Budeson-Glycopyrrol-Formoterol 160-9-4.8 MCG/ACT AERO Inhale 2 puffs into the lungs 2 (two) times daily. 10.7 g 2  . calcium carbonate (OSCAL) 1500 (600 Ca) MG TABS tablet Take by mouth 2 (two) times daily with a meal.    . carvedilol (COREG) 3.125 MG tablet Take 1 tablet (3.125 mg total) by mouth 2 (two) times daily with a meal. 180 tablet 3  . docusate sodium (COLACE) 250 MG capsule Take 250 mg by mouth daily as needed for constipation.    . furosemide (LASIX) 20 MG tablet TAKE 1 TABLET BY MOUTH THREE TIMES A WEEK, TAKE ANY DAY YOU WEIGH OVER 173 POUNDS OR LESS 90 tablet 3  . loratadine (CLARITIN) 10 MG tablet Take 10 mg by mouth daily as needed for allergies.    . Multiple Vitamins-Minerals (CENTRUM SILVER 50+WOMEN PO) Take 1 tablet by mouth daily.    Marland Kitchen nystatin-triamcinolone ointment (MYCOLOG) Apply topically 4 (four) times daily.    Marland Kitchen saccharomyces boulardii (FLORASTOR) 250 MG capsule Take 250 mg by mouth 2 (two) times daily.    . TURMERIC CURCUMIN PO Take 1  tablet by mouth at bedtime.     No current facility-administered medications on file prior to visit.   Past Medical History:  Diagnosis Date  . COPD (chronic obstructive pulmonary disease) ([place])   . Depression   . HTN (hypertension)   . Hyperlipidemia   . PONV (postoperative nausea and vomiting)   . PONV (postoperative nausea and vomiting)    Past Surgical History:  Procedure Laterality Date  . ABDOMINAL HYSTERECTOMY    . CARDIOVERSION N/A 06/13/2017   Procedure: CARDIOVERSION;  Surgeon: Lelon Perla, MD;  Location[facility] END[facility]COPY;  Service: Cardiovascular;  Laterality: N/A;  . CARDIOVERSION N/A 07/07/2017   Procedure: CARDIOVERSION;  Surgeon: Sanda Klein, MD;  Location: [facility] ENDOSCOPY;  Service: Cardiovascular;  Laterality: N/A;  . TEE WITHOUT CARDIOVERSION N/A 06/13/2017   Procedure: TRANSESOPHAGEAL ECHOCARDIOGRAM (TEE);  Surgeon: Lelon Perla, MD;  Location: [facility] ENDOSCOPY;  Service: Cardiovascular;  Laterality: N/A;    Family History  Problem Relation Age of Onset  . CAD Mother   . Cardiomyopathy Mother   . Diabetes Mother   . Asthma Father   . Breast cancer Sister   . Diabetes Brother   . Hypertension Brother   . AAA (abdominal aortic aneurysm) Sister    Social History   Socioeconomic  History  . Marital status: Widowed    Spouse name: Not on file  . Number of children: Not on file  . Years of education: Not on file  . Highest education level: Not on file  Occupational History  . Not on file  Tobacco Use  . Smoking status: Never Smoker  . Smokeless tobacco: Never Used  Vaping Use  . Vaping Use: Never used  Substance and Sexual Activity  . Alcohol use: No  . Drug use: No  . Sexual activity: Not Currently  Other Topics Concern  . Not on file  Social History Narrative  . Not on file   Social Determinants of Health   Financial Resource Strain: Low Risk   . Difficulty of Paying Living Expenses: Not hard at all  Food Insecurity: No Food Insecurity   . Worried About Charity fundraiser in the Last Year: Never true  . Ran Out of Food in the Last Year: Never true  Transportation Needs: No Transportation Needs  . Lack of Transportation (Medical): No  . Lack of Transportation (Non-Medical): No  Physical Activity: Not on file  Stress: Stress Concern Present  . Feeling of Stress : To some extent  Social Connections: Moderately Integrated  . Frequency of Communication with Friends and Family: More than three times a week  . Frequency of Social Gatherings with Friends and Family: More than three times a week  . Attends Religious Services: More than 4 times per year  . Active Member of Clubs or Organizations: Yes  . Attends Archivist Meetings: 1 to 4 times per year  . Marital Status: Widowed    Review of Systems  Constitutional: Negative for activity change and appetite change.  HENT: Negative for congestion and sinus pain.   Eyes: Negative for visual disturbance.  Respiratory: Negative for chest tightness and shortness of breath.   Cardiovascular: Negative for chest pain, palpitations and leg swelling.  Gastrointestinal: Negative for abdominal distention and abdominal pain.  Genitourinary: Negative for difficulty urinating.  Musculoskeletal: Negative for arthralgias and back pain.  Skin: Negative.   Neurological: Negative for dizziness.  Psychiatric/Behavioral: Negative.      Objective:  BP 124/80   Pulse 87   Temp 97.8 F (36.6 C)   Resp 16   Ht 5\' 5"  (1.651 m)   Wt 181 lb (82.1 kg)   SpO2 93%   BMI 30.12 kg/m   BP/Weight 12/02/2020 10/19/2020 3/50/0938  Systolic BP 182 993 716  Diastolic BP 80 70 72  Wt. (Lbs) 181 183 180.2  BMI 30.12 30.45 29.99    Physical Exam Vitals reviewed.  Constitutional:      Appearance: Normal appearance.  Eyes:     Extraocular Movements: Extraocular movements intact.     Conjunctiva/sclera: Conjunctivae normal.     Pupils: Pupils are equal, round, and reactive to light.   Cardiovascular:     Rate and Rhythm: Normal rate. Rhythm irregular.     Pulses: Normal pulses.     Heart sounds: No murmur heard. No gallop.   Pulmonary:     Effort: Pulmonary effort is normal.     Breath sounds: Normal breath sounds.  Musculoskeletal:        General: Normal range of motion.     Cervical back: Normal range of motion and neck supple.  Skin:    General: Skin is warm.     Capillary Refill: Capillary refill takes less than 2 seconds.     Comments: Purpura on  legs and stasis changes.  Neurological:     Mental Status: She is alert.       Lab Results  Component Value Date   WBC 7.0 10/19/2020   HGB 14.4 10/19/2020   HCT 42.5 10/19/2020   PLT 160 10/19/2020   GLUCOSE 93 10/19/2020   CHOL 156 10/19/2020   TRIG 69 10/19/2020   HDL 41 10/19/2020   LDLCALC 101 (H) 10/19/2020   ALT 26 10/19/2020   AST 27 10/19/2020   NA 141 10/19/2020   K 4.5 10/19/2020   CL 103 10/19/2020   CREATININE 0.78 10/19/2020   BUN 14 10/19/2020   CO2 23 10/19/2020   TSH 3.540 10/09/2020      Assessment & Plan:   Diagnoses and all orders for this visit: Persistent atrial fibrillation [facility]) Patient has a diagnosis of permanent atrial fibrillation.   Patient is on eliquis and has controlled ventricular response.  Patient is CV stable . Senile purpura (HCC) I explained senile purpura and can use stocking, compression hose, avoid any trauma.       Follow-up: Return as scheduled.  An After Visit Summary was printed and given to the patient.  [name], MD [facility] [phone]

## 2020-12-11 ENCOUNTER — Other Ambulatory Visit: Payer: Self-pay

## 2020-12-11 MED ORDER — ATORVASTATIN CALCIUM 20 MG PO TABS: ORAL_TABLET | ORAL | 2 refills | Status: DC

## 2020-12-14 ENCOUNTER — Other Ambulatory Visit: Payer: Self-pay

## 2020-12-14 MED ORDER — APIXABAN 5 MG PO TABS: 5.0000 mg | ORAL_TABLET | Freq: Two times a day (BID) | ORAL | 2 refills | Status: DC

## 2020-12-14 NOTE — Chronic Care Management (AMB) (Signed)
Contacted [facility] to verify they had Eliquis prescription. Stated they were working on filling it and it would be ready around 5:00 pm. Patient was made aware.   Elly Modena Owens Shark, [place] notified  Margaretmary Dys, [place] Pharmacy Assistant [phone]

## 2020-12-29 ENCOUNTER — Other Ambulatory Visit: Payer: Self-pay | Admitting: Cardiology

## 2020-12-29 DIAGNOSIS — Z79899 Other long term (current) drug therapy: Secondary | ICD-10-CM

## 2021-01-25 ENCOUNTER — Ambulatory Visit: Payer: PPO | Admitting: Legal Medicine

## 2021-01-25 ENCOUNTER — Other Ambulatory Visit: Payer: Self-pay

## 2021-01-27 ENCOUNTER — Other Ambulatory Visit: Payer: Self-pay

## 2021-01-27 ENCOUNTER — Ambulatory Visit (INDEPENDENT_AMBULATORY_CARE_PROVIDER_SITE_OTHER): Payer: PPO | Admitting: Legal Medicine

## 2021-01-27 ENCOUNTER — Encounter: Payer: Self-pay | Admitting: Legal Medicine

## 2021-01-27 VITALS — BP 126/70 | HR 60 | Temp 97.6°F | Resp 16 | Ht 65.0 in | Wt 183.0 lb

## 2021-01-27 DIAGNOSIS — D692 Other nonthrombocytopenic purpura: Secondary | ICD-10-CM

## 2021-01-27 MED ORDER — TRIAMCINOLONE ACETONIDE 0.1 % EX CREA: 1.0000 "application " | TOPICAL_CREAM | Freq: Two times a day (BID) | CUTANEOUS | 3 refills | Status: DC

## 2021-01-27 NOTE — Progress Notes (Signed)
Established Patient Office Visit  Subjective:  Patient ID: Stephanie Douglas, female    DOB: 15-Aug-1933  Age: 87 y.o. MRN: [id number]  CC:  Chief Complaint  Patient presents with   Rash    Both Legs since few months ago.     HPI [name] presents for bruising legs.  She works in garden. She is on eliquis.  No edema.  Past Medical History:  Diagnosis Date   COPD (chronic obstructive pulmonary disease) (HCC)    Depression    HTN (hypertension)    Hyperlipidemia    PONV (postoperative nausea and vomiting)    PONV (postoperative nausea and vomiting)     Past Surgical History:  Procedure Laterality Date   ABDOMINAL HYSTERECTOMY     CARDIOVERSION N/A 06/13/2017   Procedure: CARDIOVERSION;  Surgeon: Lelon Perla, MD;  Location: [place];  Service: Cardiovascular;  Laterality: N/A;   CARDIOVERSION N/A 07/07/2017   Procedure: CARDIOVERSION;  Surgeon: Sanda Klein, MD;  Location: [place];  Service: Cardiovascular;  Laterality: N/A;   TEE WITHOUT CARDIOVERSION N/A 06/13/2017   Procedure: TRANSESOPHAGEAL ECHOCARDIOGRAM (TEE);  Surgeon: Lelon Perla, MD;  Location: [facility] ENDOSCOPY;  Service: Cardiovascular;  Laterality: N/A;    Family History  Problem Relation Age of Onset   CAD Mother    Cardiomyopathy Mother    Diabetes Mother    Asthma Father    Breast cancer Sister    Diabetes Brother    Hypertension Brother    AAA (abdominal aortic aneurysm) Sister     Social History   Socioeconomic History   Marital status: Widowed    Spouse name: Not on file   Number of children: Not on file   Years of education: Not on file   Highest education level: Not on file  Occupational History   Not on file  Tobacco Use   Smoking status: Never   Smokeless tobacco: Never  Vaping Use   Vaping Use: Never used  Substance and Sexual Activity   Alcohol use: No   Drug use: No   Sexual activity: Not Currently  Other Topics Concern   Not on file  Social  History Narrative   Not on file   Social Determinants of Health   Financial Resource Strain: Low Risk    Difficulty of Paying Living Expenses: Not hard at all  Food Insecurity: No Food Insecurity   Worried About Running Out of Food in the Last Year: Never true   [place] in the Last Year: Never true  Transportation Needs: No Transportation Needs   Lack of Transportation (Medical): No   Lack of Transportation (Non-Medical): No  Physical Activity: Not on file  Stress: Stress Concern Present   Feeling of Stress : To some extent  Social Connections: Moderately Integrated   Frequency of Communication with Friends and Family: More than three times a week   Frequency of Social Gatherings with Friends and Family: More than three times a week   Attends Religious Services: More than 4 times per year   Active Member of [employer] or Organizations: Yes   Attends Archivist Meetings: 1 to 4 times per year   Marital Status: Widowed  Human resources officer Violence: Not At Risk   Fear of Current or Ex-Partner: No   Emotionally Abused: No   Physically Abused: No   Sexually Abused: No    Outpatient Medications Prior to Visit  Medication Sig Dispense Refill   Acetaminophen (TYLENOL PO) Take  500 mg by mouth at bedtime.      alendronate (FOSAMAX) 70 MG tablet Take 70 mg by mouth once a week.     ALPRAZolam (XANAX) 0.25 MG tablet TAKE 1 TABLET(0.25 MG) BY MOUTH TWICE DAILY AS NEEDED FOR ANXIETY 60 tablet 3   amiodarone (PACERONE) 200 MG tablet TAKE 1 TABLET BY MOUTH EVERY DAY 90 tablet 0   apixaban (ELIQUIS) 5 MG TABS tablet Take 1 tablet (5 mg total) by mouth 2 (two) times daily. 180 tablet 2   atorvastatin (LIPITOR) 20 MG tablet TAKE 1 TABLET(20 MG) BY MOUTH DAILY 90 tablet 2   Budeson-Glycopyrrol-Formoterol 160-9-4.8 MCG/ACT AERO Inhale 2 puffs into the lungs 2 (two) times daily. 10.7 g 2   calcium carbonate (OSCAL) 1500 (600 Ca) MG TABS tablet Take by mouth 2 (two) times daily with a  meal.     carvedilol (COREG) 3.125 MG tablet Take 1 tablet (3.125 mg total) by mouth 2 (two) times daily with a meal. 180 tablet 3   docusate sodium (COLACE) 250 MG capsule Take 250 mg by mouth daily as needed for constipation.     furosemide (LASIX) 20 MG tablet TAKE 1 TABLET BY MOUTH THREE TIMES A WEEK, TAKE ANY DAY YOU WEIGH OVER 173 POUNDS OR LESS 90 tablet 3   loratadine (CLARITIN) 10 MG tablet Take 10 mg by mouth daily as needed for allergies.     Multiple Vitamins-Minerals (CENTRUM SILVER 50+WOMEN PO) Take 1 tablet by mouth daily.     nystatin-triamcinolone ointment (MYCOLOG) Apply topically 4 (four) times daily.     saccharomyces boulardii (FLORASTOR) 250 MG capsule Take 250 mg by mouth 2 (two) times daily.     TURMERIC CURCUMIN PO Take 1 tablet by mouth at bedtime.     No facility-administered medications prior to visit.    No Known Allergies  ROS Review of Systems  Constitutional:  Negative for activity change and appetite change.  HENT:  Negative for congestion.   Eyes:  Negative for visual disturbance.  Respiratory:  Negative for chest tightness.   Cardiovascular:  Negative for chest pain, palpitations and leg swelling.  Gastrointestinal:  Negative for abdominal distention and abdominal pain.  Genitourinary:  Negative for difficulty urinating, dysuria and urgency.  Musculoskeletal:  Negative for arthralgias and back pain.  Neurological: Negative.   Psychiatric/Behavioral: Negative.       Objective:    Physical Exam Vitals reviewed.  Constitutional:      Appearance: Normal appearance. She is obese.  HENT:     Head: Normocephalic.     Right Ear: Tympanic membrane, ear canal and external ear normal.     Left Ear: Tympanic membrane, ear canal and external ear normal.     Mouth/Throat:     Mouth: Mucous membranes are moist.     Pharynx: Oropharynx is clear.  Eyes:     Extraocular Movements: Extraocular movements intact.     Conjunctiva/sclera: Conjunctivae normal.      Pupils: Pupils are equal, round, and reactive to light.  Cardiovascular:     Rate and Rhythm: Normal rate and regular rhythm.     Pulses: Normal pulses.     Heart sounds: Normal heart sounds. No murmur heard.   No gallop.  Pulmonary:     Effort: Pulmonary effort is normal. No respiratory distress.     Breath sounds: Normal breath sounds. No wheezing.  Abdominal:     General: Abdomen is flat. Bowel sounds are normal. There is no distension.  Palpations: Abdomen is soft.     Tenderness: There is no abdominal tenderness.  Musculoskeletal:        General: Normal range of motion.     Cervical back: Normal range of motion and neck supple.  Skin:    General: Skin is warm.     Capillary Refill: Capillary refill takes less than 2 seconds.  Neurological:     General: No focal deficit present.     Mental Status: She is alert and oriented to person, place, and time. Mental status is at baseline.  Psychiatric:        Mood and Affect: Mood normal.        Thought Content: Thought content normal.        Judgment: Judgment normal.    BP 126/70   Pulse 60   Temp 97.6 F (36.4 C)   Resp 16   Ht $R'5\' 5"'CV$  (1.651 m)   Wt 183 lb (83 kg)   SpO2 92%   BMI 30.45 kg/m  Wt Readings from Last 3 Encounters:  01/27/21 183 lb (83 kg)  12/02/20 181 lb (82.1 kg)  10/19/20 183 lb (83 kg)     Health Maintenance Due  Topic Date Due   TETANUS/TDAP  Never done   Zoster Vaccines- Shingrix (1 of 2) Never done   DEXA SCAN  Never done   PNA vac Low Risk Adult (1 of 2 - PCV13) Never done    There are no preventive care reminders to display for this patient.  Lab Results  Component Value Date   TSH 3.540 10/09/2020   Lab Results  Component Value Date   WBC 7.0 10/19/2020   HGB 14.4 10/19/2020   HCT 42.5 10/19/2020   MCV 98 (H) 10/19/2020   PLT 160 10/19/2020   Lab Results  Component Value Date   NA 141 10/19/2020   K 4.5 10/19/2020   CO2 23 10/19/2020   GLUCOSE 93 10/19/2020   BUN 14  10/19/2020   CREATININE 0.78 10/19/2020   BILITOT 0.5 10/19/2020   ALKPHOS 75 10/19/2020   AST 27 10/19/2020   ALT 26 10/19/2020   PROT 7.0 10/19/2020   ALBUMIN 4.2 10/19/2020   CALCIUM 9.3 10/19/2020   ANIONGAP 10 07/23/2017   EGFR 74 10/19/2020   Lab Results  Component Value Date   CHOL 156 10/19/2020   Lab Results  Component Value Date   HDL 41 10/19/2020   Lab Results  Component Value Date   LDLCALC 101 (H) 10/19/2020   Lab Results  Component Value Date   TRIG 69 10/19/2020   Lab Results  Component Value Date   CHOLHDL 3.8 10/19/2020   No results found for: HGBA1C    Assessment & Plan:   Diagnoses and all orders for this visit: Senile purpura (HCC) -     triamcinolone cream (KENALOG) 0.1 %; Apply 1 application topically 2 (two) times daily.  We discussed TED hose to protect legs, use triamcinolone to help with discoloration from recurrent bruising.   Follow-up: Return if symptoms worsen or fail to improve.    [name], MD

## 2021-01-28 ENCOUNTER — Telehealth: Payer: Self-pay

## 2021-01-28 NOTE — Telephone Encounter (Signed)
  Chronic Care Management   Note  01/28/2021 Name: Stephanie Douglas MRN: [id number] DOB: September 27, 1933  Patient called pharmacist concerned with a form she received in the mail asking about other insurance coverage. Patient brought form for pharmacist to assist with filling out. Patient signed form stating that she had no other coverage and pharmacist attached a note stating inhaler is provided through [employer] patient assistance.  Pharmacist submitted form to [employer] via fax on patient's behalf.   Sherre Poot, PharmD, [facility] Clinical Pharmacist [facility] [phone] (office) [phone] (mobile)

## 2021-02-05 ENCOUNTER — Ambulatory Visit: Payer: PPO | Admitting: Legal Medicine

## 2021-02-15 ENCOUNTER — Telehealth: Payer: PPO

## 2021-02-15 ENCOUNTER — Other Ambulatory Visit: Payer: Self-pay

## 2021-02-15 ENCOUNTER — Encounter: Payer: Self-pay | Admitting: Legal Medicine

## 2021-02-15 ENCOUNTER — Ambulatory Visit (INDEPENDENT_AMBULATORY_CARE_PROVIDER_SITE_OTHER): Payer: PPO | Admitting: Legal Medicine

## 2021-02-15 VITALS — BP 150/90 | HR 70 | Temp 97.3°F | Resp 16 | Ht 65.0 in | Wt 182.0 lb

## 2021-02-15 DIAGNOSIS — I4819 Other persistent atrial fibrillation: Secondary | ICD-10-CM | POA: Diagnosis not present

## 2021-02-15 DIAGNOSIS — I5042 Chronic combined systolic (congestive) and diastolic (congestive) heart failure: Secondary | ICD-10-CM

## 2021-02-15 DIAGNOSIS — Z7901 Long term (current) use of anticoagulants: Secondary | ICD-10-CM | POA: Diagnosis not present

## 2021-02-15 DIAGNOSIS — Z23 Encounter for immunization: Secondary | ICD-10-CM | POA: Diagnosis not present

## 2021-02-15 DIAGNOSIS — M810 Age-related osteoporosis without current pathological fracture: Secondary | ICD-10-CM | POA: Diagnosis not present

## 2021-02-15 DIAGNOSIS — E782 Mixed hyperlipidemia: Secondary | ICD-10-CM

## 2021-02-15 DIAGNOSIS — J449 Chronic obstructive pulmonary disease, unspecified: Secondary | ICD-10-CM

## 2021-02-15 DIAGNOSIS — I110 Hypertensive heart disease with heart failure: Secondary | ICD-10-CM

## 2021-02-15 DIAGNOSIS — D692 Other nonthrombocytopenic purpura: Secondary | ICD-10-CM

## 2021-02-15 MED ORDER — ALENDRONATE SODIUM 70 MG PO TABS: 70.0000 mg | ORAL_TABLET | ORAL | 1 refills | Status: DC

## 2021-02-15 NOTE — Progress Notes (Signed)
Established Patient Office Visit  Subjective:  Patient ID: Stephanie Douglas, female    DOB: [birth year]  Age: 87 y.o. MRN: [id number]  CC:  Chief Complaint  Patient presents with   Atrial Fibrillation   Hyperlipidemia   COPD    HPI Stephanie Douglas presents for chronic visit  Bright red blood in stool just yesterday.  It has now stopped.  Patient presents with HFrEF  that is stable. Diagnosis made 2019.  The course of the disease is stable.  Current medicines include carvidilol, amiodarone, . Patient follows a low cholesterol diet and maintains a weight diary.  Patient is on low salt, low cholesterol diet and avoids alcohol.  Patient denies adverse effects of medicines. Patient is monitoring weight and has no weight changes of weight.  Patient is having no pedal edema, no PND and no PND.  Patient is continuing to see cardiology.   Patient has a diagnosis of permanent atrial fibrillation.   Patient is on eliquis and has conrolled ventricular response.  Patient is CV stable.   Past Medical History:  Diagnosis Date   COPD (chronic obstructive pulmonary disease) (HCC)    Depression    HTN (hypertension)    Hyperlipidemia    PONV (postoperative nausea and vomiting)    PONV (postoperative nausea and vomiting)     Past Surgical History:  Procedure Laterality Date   ABDOMINAL HYSTERECTOMY     CARDIOVERSION N/A 06/13/2017   Procedure: CARDIOVERSION;  Surgeon: Lelon Perla, MD;  Location: [place];  Service: Cardiovascular;  Laterality: N/A;   CARDIOVERSION N/A 07/07/2017   Procedure: CARDIOVERSION;  Surgeon: Sanda Klein, MD;  Location: [place];  Service: Cardiovascular;  Laterality: N/A;   TEE WITHOUT CARDIOVERSION N/A 06/13/2017   Procedure: TRANSESOPHAGEAL ECHOCARDIOGRAM (TEE);  Surgeon: Lelon Perla, MD;  Location: [facility] END[facility]COPY;  Service: Cardiovascular;  Laterality: N/A;    Family History  Problem Relation Age of Onset   CAD Mother    Cardiomyopathy  Mother    Diabetes Mother    Asthma Father    Breast cancer Sister    Diabetes Brother    Hypertension Brother    AAA (abdominal aortic aneurysm) Sister     Social History   Socioeconomic History   Marital status: Widowed    Spouse name: Not on file   Number of children: Not on file   Years of education: Not on file   Highest education level: Not on file  Occupational History   Not on file  Tobacco Use   Smoking status: Never   Smokeless tobacco: Never  Vaping Use   Vaping Use: Never used  Substance and Sexual Activity   Alcohol use: No   Drug use: No   Sexual activity: Not Currently  Other Topics Concern   Not on file  Social History Narrative   Not on file   Social Determinants of Health   Financial Resource Strain: Low Risk    Difficulty of Paying Living Expenses: Not hard at all  Food Insecurity: No Food Insecurity   Worried About Running Out of Food in the Last Year: Never true   [place] in the Last Year: Never true  Transportation Needs: No Transportation Needs   Lack of Transportation (Medical): No   Lack of Transportation (Non-Medical): No  Physical Activity: Not on file  Stress: Stress Concern Present   Feeling of Stress : To some extent  Social Connections: Moderately Integrated   Frequency of Communication with  Friends and Family: More than three times a week   Frequency of Social Gatherings with Friends and Family: More than three times a week   Attends Religious Services: More than 4 times per year   Active Member of [employer] or Organizations: Yes   Attends Banker Meetings: 1 to 4 times per year   Marital Status: Widowed  Catering manager Violence: Not At Risk   Fear of Current or Ex-Partner: No   Emotionally Abused: No   Physically Abused: No   Sexually Abused: No    Outpatient Medications Prior to Visit  Medication Sig Dispense Refill   Acetaminophen (TYLENOL PO) Take 500 mg by mouth at bedtime.      ALPRAZolam (XANAX)  0.25 MG tablet TAKE 1 TABLET(0.25 MG) BY MOUTH TWICE DAILY AS NEEDED FOR ANXIETY 60 tablet 3   amiodarone (PACERONE) 200 MG tablet TAKE 1 TABLET BY MOUTH EVERY DAY 90 tablet 0   apixaban (ELIQUIS) 5 MG TABS tablet Take 1 tablet (5 mg total) by mouth 2 (two) times daily. 180 tablet 2   atorvastatin (LIPITOR) 20 MG tablet TAKE 1 TABLET(20 MG) BY MOUTH DAILY 90 tablet 2   Budeson-Glycopyrrol-Formoterol 160-9-4.8 MCG/ACT AERO Inhale 2 puffs into the lungs 2 (two) times daily. 10.7 g 2   calcium carbonate (OSCAL) 1500 (600 Ca) MG TABS tablet Take by mouth 2 (two) times daily with a meal.     carvedilol (COREG) 3.125 MG tablet Take 1 tablet (3.125 mg total) by mouth 2 (two) times daily with a meal. 180 tablet 3   docusate sodium (COLACE) 250 MG capsule Take 250 mg by mouth daily as needed for constipation.     furosemide (LASIX) 20 MG tablet TAKE 1 TABLET BY MOUTH THREE TIMES A WEEK, TAKE ANY DAY YOU WEIGH OVER 173 POUNDS OR LESS 90 tablet 3   loratadine (CLARITIN) 10 MG tablet Take 10 mg by mouth daily as needed for allergies.     Multiple Vitamins-Minerals (CENTRUM SILVER 50+WOMEN PO) Take 1 tablet by mouth daily.     nystatin-triamcinolone ointment (MYCOLOG) Apply topically 4 (four) times daily.     saccharomyces boulardii (FLORASTOR) 250 MG capsule Take 250 mg by mouth 2 (two) times daily.     triamcinolone cream (KENALOG) 0.1 % Apply 1 application topically 2 (two) times daily. 30 g 3   TURMERIC CURCUMIN PO Take 1 tablet by mouth at bedtime.     alendronate (FOSAMAX) 70 MG tablet Take 70 mg by mouth once a week.     No facility-administered medications prior to visit.    No Known Allergies  ROS Review of Systems  Constitutional:  Negative for activity change and appetite change.  HENT:  Positive for hearing loss. Negative for congestion and facial swelling.   Eyes:  Negative for visual disturbance.  Respiratory:  Negative for chest tightness and shortness of breath.   Gastrointestinal:   Negative for abdominal distention and abdominal pain.  Endocrine: Negative for polyuria.  Genitourinary: Negative.  Negative for difficulty urinating and dysuria.  Musculoskeletal:  Negative for arthralgias and back pain.  Neurological:  Negative for dizziness and facial asymmetry.  Hematological: Negative.   All other systems reviewed and are negative.    Objective:    Physical Exam Vitals reviewed.  Constitutional:      Appearance: Normal appearance.  HENT:     Head: Normocephalic.     Right Ear: Tympanic membrane normal.     Left Ear: Tympanic membrane normal.  Nose: Nose normal.     Mouth/Throat:     Mouth: Mucous membranes are moist.     Pharynx: Oropharynx is clear.  Eyes:     Extraocular Movements: Extraocular movements intact.     Conjunctiva/sclera: Conjunctivae normal.     Pupils: Pupils are equal, round, and reactive to light.  Cardiovascular:     Rate and Rhythm: Normal rate. Rhythm irregular.     Pulses: Normal pulses.     Heart sounds: Normal heart sounds. No murmur heard.   No gallop.  Pulmonary:     Effort: Pulmonary effort is normal. No respiratory distress.     Breath sounds: Normal breath sounds. No wheezing.  Abdominal:     General: Abdomen is flat. Bowel sounds are normal. There is no distension.     Palpations: Abdomen is soft.     Tenderness: There is no abdominal tenderness.  Musculoskeletal:        General: Normal range of motion.     Cervical back: Normal range of motion and neck supple.  Skin:    Capillary Refill: Capillary refill takes less than 2 seconds.     Findings: Bruising present.  Neurological:     General: No focal deficit present.     Mental Status: She is alert and oriented to person, place, and time.    BP (!) 150/90   Pulse 70   Temp (!) 97.3 F (36.3 C)   Resp 16   Ht $R'5\' 5"'By$  (1.651 m)   Wt 182 lb (82.6 kg)   BMI 30.29 kg/m  Wt Readings from Last 3 Encounters:  02/15/21 182 lb (82.6 kg)  01/27/21 183 lb (83 kg)   12/02/20 181 lb (82.1 kg)     Health Maintenance Due  Topic Date Due   TETANUS/TDAP  Never done   Zoster Vaccines- Shingrix (1 of 2) Never done   DEXA SCAN  Never done    There are no preventive care reminders to display for this patient.  Lab Results  Component Value Date   TSH 3.540 10/09/2020   Lab Results  Component Value Date   WBC 7.0 10/19/2020   HGB 14.4 10/19/2020   HCT 42.5 10/19/2020   MCV 98 (H) 10/19/2020   PLT 160 10/19/2020   Lab Results  Component Value Date   NA 141 10/19/2020   K 4.5 10/19/2020   CO2 23 10/19/2020   GLUCOSE 93 10/19/2020   BUN 14 10/19/2020   CREATININE 0.78 10/19/2020   BILITOT 0.5 10/19/2020   ALKPHOS 75 10/19/2020   AST 27 10/19/2020   ALT 26 10/19/2020   PROT 7.0 10/19/2020   ALBUMIN 4.2 10/19/2020   CALCIUM 9.3 10/19/2020   ANIONGAP 10 07/23/2017   EGFR 74 10/19/2020   Lab Results  Component Value Date   CHOL 156 10/19/2020   Lab Results  Component Value Date   HDL 41 10/19/2020   Lab Results  Component Value Date   LDLCALC 101 (H) 10/19/2020   Lab Results  Component Value Date   TRIG 69 10/19/2020   Lab Results  Component Value Date   CHOLHDL 3.8 10/19/2020   No results found for: HGBA1C    Assessment & Plan:   Problem List Items Addressed This Visit       Cardiovascular and Mediastinum   Chronic combined systolic and diastolic heart failure ([place]) An individualized care plan was established and reinforced.  The patient's disease status was assessed using clinical finding son exam today, labs,  and/or other diagnostic testing such as x-rays, to determine the patient's success in meeting treatmentgoalsbased on disease-based guidelines and found to be improving. But not at goal yet. Medications prescriptions no changes Laboratory tests ordered to be performed today include routine. RECOMMENDATIONS: given include see cardiology.  Call physician is patient gains 3 lbs in one day or 5 lbs for one week.   Call for progressive PND, orthopnea or increased pedal edema.     Hypertensive heart disease with heart failure (HCC)   Relevant Orders   CBC with Differential/Platelet   Comprehensive metabolic panel An individual hypertension care plan was established and reinforced today.  The patient's status was assessed using clinical findings on exam and labs or diagnostic tests. The patient's success at meeting treatment goals on disease specific evidence-based guidelines and found to be well controlled. SELF MANAGEMENT: The patient and I together assessed ways to personally work towards obtaining the recommended goals. RECOMMENDATIONS: avoid decongestants found in common cold remedies, decrease consumption of alcohol, perform routine monitoring of BP with home BP cuff, exercise, reduction of dietary salt, take medicines as prescribed, try not to miss doses and quit smoking.  Regular exercise and maintaining a healthy weight is needed.  Stress reduction may help. A CLINICAL SUMMARY including written plan identify barriers to care unique to individual due to social or financial issues.  We attempt to mutually creat solutions for individual and family understanding.     Persistent atrial fibrillation ([place]) - Primary Patient has a diagnosis of permanent atrial fibrillation.   Patient is on eliquis and has controlled ventricular response.  Patient is CV stable .    Senile purpura (HCC) Patient has bruising on arms and legs     Respiratory   COPD (chronic obstructive pulmonary disease) (HCC) (Chronic) An individualize plan was formulated for care of COPD.  Treatment is evidence based.  She will continue on inhalers, avoid smoking and smoke.  Regular exercise with help with dyspnea. Routine follow ups and medication compliance is needed.      Other   Chronic anticoagulation Patient on chronic eliquis    Mixed hyperlipidemia   Relevant Orders   Lipid panel AN INDIVIDUAL CARE PLAN for hyperlipidemia/  cholesterol was established and reinforced today.  The patient's status was assessed using clinical findings on exam, lab and other diagnostic tests. The patient's disease status was assessed based on evidence-based guidelines and found to be fair controlled. MEDICATIONS were reviewed. SELF MANAGEMENT GOALS have been discussed and patient's success at attaining the goal of low cholesterol was assessed. RECOMMENDATION given include regular exercise 3 days a week and low cholesterol/low fat diet. CLINICAL SUMMARY including written plan to identify barriers unique to the patient due to social or economic  reasons was discussed.    Other Visit Diagnoses     Age-related osteoporosis without current pathological fracture       Relevant Medications   alendronate (FOSAMAX) 70 MG tablet AN INDIVIDUAL CARE PLAN for osteoporosis was established and reinforced today.  The patient's status was assessed using clinical findings on exam, labs, and other diagnostic testing. Patient's success at meeting treatment goals based on disease specific evidence-bassed guidelines and found to be in good control. RECOMMENDATIONS include maintaining present medicines and treatment.    Need for vaccination       Relevant Orders   Pneumococcal polysaccharide vaccine 23-valent greater than or equal to 2yo subcutaneous/IM (Completed)       Meds ordered this encounter  Medications   alendronate (  FOSAMAX) 70 MG tablet    Sig: Take 1 tablet (70 mg total) by mouth once a week.    Dispense:  12 tablet    Refill:  1    Follow-up: Return in about 6 months (around 08/18/2021) for fasting.    [name], MD

## 2021-02-16 ENCOUNTER — Other Ambulatory Visit: Payer: Self-pay | Admitting: Legal Medicine

## 2021-02-16 ENCOUNTER — Other Ambulatory Visit: Payer: Self-pay

## 2021-02-16 DIAGNOSIS — Z1231 Encounter for screening mammogram for malignant neoplasm of breast: Secondary | ICD-10-CM

## 2021-02-16 LAB — COMPREHENSIVE METABOLIC PANEL
ALT: 26 IU/L (ref 0–32)
AST: 29 IU/L (ref 0–40)
Albumin/Globulin Ratio: 1.3 (ref 1.2–2.2)
Albumin: 4 g/dL (ref 3.6–4.6)
Alkaline Phosphatase: 71 IU/L (ref 44–121)
BUN/Creatinine Ratio: 20 (ref 12–28)
BUN: 17 mg/dL (ref 8–27)
Bilirubin Total: 0.5 mg/dL (ref 0.0–1.2)
CO2: 26 mmol/L (ref 20–29)
Calcium: 9.4 mg/dL (ref 8.7–10.3)
Chloride: 104 mmol/L (ref 96–106)
Creatinine, Ser: 0.87 mg/dL (ref 0.57–1.00)
Globulin, Total: 3 g/dL (ref 1.5–4.5)
Glucose: 92 mg/dL (ref 65–99)
Potassium: 4.4 mmol/L (ref 3.5–5.2)
Sodium: 142 mmol/L (ref 134–144)
Total Protein: 7 g/dL (ref 6.0–8.5)
eGFR: 65 mL/min/{1.73_m2} (ref >59)

## 2021-02-16 LAB — CBC WITH DIFFERENTIAL/PLATELET
Basophils Absolute: 0.1 10*3/uL (ref 0.0–0.2)
Basos: 1 %
EOS (ABSOLUTE): 0.1 10*3/uL (ref 0.0–0.4)
Eos: 2 %
Hematocrit: 43.5 % (ref 34.0–46.6)
Hemoglobin: 14.3 g/dL (ref 11.1–15.9)
Immature Grans (Abs): 0 10*3/uL (ref 0.0–0.1)
Immature Granulocytes: 0 %
Lymphocytes Absolute: 1.5 10*3/uL (ref 0.7–3.1)
Lymphs: 21 %
MCH: 32.3 pg (ref 26.6–33.0)
MCHC: 32.9 g/dL (ref 31.5–35.7)
MCV: 98 fL — ABNORMAL HIGH (ref 79–97)
Monocytes Absolute: 0.7 10*3/uL (ref 0.1–0.9)
Monocytes: 10 %
Neutrophils Absolute: 4.8 10*3/uL (ref 1.4–7.0)
Neutrophils: 66 %
Platelets: 160 10*3/uL (ref 150–450)
RBC: 4.43 x10E6/uL (ref 3.77–5.28)
RDW: 13.3 % (ref 11.7–15.4)
WBC: 7.3 10*3/uL (ref 3.4–10.8)

## 2021-02-16 LAB — CARDIOVASCULAR RISK ASSESSMENT

## 2021-02-16 LAB — LIPID PANEL
Chol/HDL Ratio: 5.1 ratio — ABNORMAL HIGH (ref 0.0–4.4)
Cholesterol, Total: 195 mg/dL (ref 100–199)
HDL: 38 mg/dL — ABNORMAL LOW (ref >39)
LDL Chol Calc (NIH): 142 mg/dL — ABNORMAL HIGH (ref 0–99)
Triglycerides: 84 mg/dL (ref 0–149)
VLDL Cholesterol Cal: 15 mg/dL (ref 5–40)

## 2021-02-16 MED ORDER — ROSUVASTATIN CALCIUM 20 MG PO TABS: 20.0000 mg | ORAL_TABLET | Freq: Every day | ORAL | 3 refills | Status: DC

## 2021-02-16 NOTE — Progress Notes (Signed)
Cbc normal, kidney and liver tests normal, LDL cholesterol high at 142 consider changing to crestor to lower,  lp

## 2021-02-16 NOTE — Progress Notes (Signed)
Stop atorvastatin, change to 20mg  crestor lp

## 2021-02-19 ENCOUNTER — Telehealth: Payer: Self-pay

## 2021-02-19 NOTE — Progress Notes (Signed)
    Chronic Care Management Pharmacy Assistant   Name: Stephanie Douglas  MRN: [id number] DOB: September 05, 1933  Reason for Encounter: Medication Review for out of pocket expense request    Office Visit: 02/15/21- Dr Henrene Pastor PCP, Afib, labs ordered, Atorvastatin was stopped, switched to Crestor '20mg'$ .  Lab results:  Cbc normal, kidney and liver tests normal, LDL cholesterol high at 142 consider changing to crestor to lower,lp  follow up 6 months    Medications: Outpatient Encounter Medications as of 02/19/2021  Medication Sig   Acetaminophen (TYLENOL PO) Take 500 mg by mouth at bedtime.    alendronate (FOSAMAX) 70 MG tablet Take 1 tablet (70 mg total) by mouth once a week.   ALPRAZolam (XANAX) 0.25 MG tablet TAKE 1 TABLET(0.25 MG) BY MOUTH TWICE DAILY AS NEEDED FOR ANXIETY   amiodarone (PACERONE) 200 MG tablet TAKE 1 TABLET BY MOUTH EVERY DAY   apixaban (ELIQUIS) 5 MG TABS tablet Take 1 tablet (5 mg total) by mouth 2 (two) times daily.   Budeson-Glycopyrrol-Formoterol 160-9-4.8 MCG/ACT AERO Inhale 2 puffs into the lungs 2 (two) times daily.   calcium carbonate (OSCAL) 1500 (600 Ca) MG TABS tablet Take by mouth 2 (two) times daily with a meal.   carvedilol (COREG) 3.125 MG tablet Take 1 tablet (3.125 mg total) by mouth 2 (two) times daily with a meal.   docusate sodium (COLACE) 250 MG capsule Take 250 mg by mouth daily as needed for constipation.   furosemide (LASIX) 20 MG tablet TAKE 1 TABLET BY MOUTH THREE TIMES A WEEK, TAKE ANY DAY YOU WEIGH OVER 173 POUNDS OR LESS   loratadine (CLARITIN) 10 MG tablet Take 10 mg by mouth daily as needed for allergies.   Multiple Vitamins-Minerals (CENTRUM SILVER 50+WOMEN PO) Take 1 tablet by mouth daily.   nystatin-triamcinolone ointment (MYCOLOG) Apply topically 4 (four) times daily.   rosuvastatin (CRESTOR) 20 MG tablet Take 1 tablet (20 mg total) by mouth daily.   saccharomyces boulardii (FLORASTOR) 250 MG capsule Take 250 mg by mouth 2 (two) times daily.    triamcinolone cream (KENALOG) 0.1 % Apply 1 application topically 2 (two) times daily.   TURMERIC CURCUMIN PO Take 1 tablet by mouth at bedtime.   No facility-administered encounter medications on file as of 02/19/2021.    Donette Larry, CPP, asked me to call [facility] and request a new out of pocket expense sheet so that it can be sent in for patient assistance for eliquis.  I have called [facility] to request form, representative is going to send it over.   Donette Larry, CPP notified   Gaps: AWV scheduled for 06/07/21   Clarita Leber, [place] Pharmacist Assistant [phone]

## 2021-03-02 DIAGNOSIS — J449 Chronic obstructive pulmonary disease, unspecified: Secondary | ICD-10-CM | POA: Diagnosis not present

## 2021-03-02 DIAGNOSIS — I480 Paroxysmal atrial fibrillation: Secondary | ICD-10-CM | POA: Diagnosis not present

## 2021-03-02 DIAGNOSIS — E261 Secondary hyperaldosteronism: Secondary | ICD-10-CM | POA: Diagnosis not present

## 2021-03-02 DIAGNOSIS — E785 Hyperlipidemia, unspecified: Secondary | ICD-10-CM | POA: Diagnosis not present

## 2021-03-02 DIAGNOSIS — D6869 Other thrombophilia: Secondary | ICD-10-CM | POA: Diagnosis not present

## 2021-03-02 DIAGNOSIS — I110 Hypertensive heart disease with heart failure: Secondary | ICD-10-CM | POA: Diagnosis not present

## 2021-03-02 DIAGNOSIS — I509 Heart failure, unspecified: Secondary | ICD-10-CM | POA: Diagnosis not present

## 2021-03-09 ENCOUNTER — Other Ambulatory Visit: Payer: Self-pay | Admitting: Legal Medicine

## 2021-03-09 DIAGNOSIS — F411 Generalized anxiety disorder: Secondary | ICD-10-CM

## 2021-03-26 ENCOUNTER — Other Ambulatory Visit: Payer: Self-pay | Admitting: Cardiology

## 2021-03-26 DIAGNOSIS — Z79899 Other long term (current) drug therapy: Secondary | ICD-10-CM

## 2021-03-29 ENCOUNTER — Other Ambulatory Visit: Payer: Self-pay

## 2021-03-29 ENCOUNTER — Other Ambulatory Visit: Payer: Self-pay | Admitting: Legal Medicine

## 2021-03-29 ENCOUNTER — Ambulatory Visit
Admission: RE | Admit: 2021-03-29 | Discharge: 2021-03-29 | Disposition: A | Discharge status: Home / Self Care | Payer: PPO | Source: Ambulatory Visit | Attending: Legal Medicine | Admitting: Legal Medicine

## 2021-03-29 DIAGNOSIS — Z1231 Encounter for screening mammogram for malignant neoplasm of breast: Secondary | ICD-10-CM | POA: Diagnosis not present

## 2021-04-04 NOTE — Progress Notes (Signed)
Normal mammogram ?lp

## 2021-04-12 ENCOUNTER — Other Ambulatory Visit: Payer: Self-pay | Admitting: Legal Medicine

## 2021-04-12 DIAGNOSIS — D692 Other nonthrombocytopenic purpura: Secondary | ICD-10-CM

## 2021-04-27 ENCOUNTER — Telehealth: Payer: Self-pay

## 2021-04-27 NOTE — Chronic Care Management (AMB) (Signed)
Chronic Care Management Pharmacy Assistant   Name: Stephanie Douglas  MRN: [id number] DOB: [birth year]   Reason for Encounter: Disease State for hypertension    Recent office visits:  None since 02/19/21  Recent consult visits:  None since 02/19/21  Hospital visits:  None in previous 6 months  Medications: Outpatient Encounter Medications as of 04/27/2021  Medication Sig   Acetaminophen (TYLENOL PO) Take 500 mg by mouth at bedtime.    alendronate (FOSAMAX) 70 MG tablet Take 1 tablet (70 mg total) by mouth once a week.   ALPRAZolam (XANAX) 0.25 MG tablet TAKE 1 TABLET(0.25 MG) BY MOUTH TWICE DAILY AS NEEDED FOR ANXIETY   amiodarone (PACERONE) 200 MG tablet TAKE 1 TABLET BY MOUTH EVERY DAY   apixaban (ELIQUIS) 5 MG TABS tablet Take 1 tablet (5 mg total) by mouth 2 (two) times daily.   Budeson-Glycopyrrol-Formoterol 160-9-4.8 MCG/ACT AERO Inhale 2 puffs into the lungs 2 (two) times daily.   calcium carbonate (OSCAL) 1500 (600 Ca) MG TABS tablet Take by mouth 2 (two) times daily with a meal.   carvedilol (COREG) 3.125 MG tablet Take 1 tablet (3.125 mg total) by mouth 2 (two) times daily with a meal.   docusate sodium (COLACE) 250 MG capsule Take 250 mg by mouth daily as needed for constipation.   furosemide (LASIX) 20 MG tablet TAKE 1 TABLET BY MOUTH THREE TIMES A WEEK, TAKE ANY DAY YOU WEIGH OVER 173 POUNDS OR LESS   loratadine (CLARITIN) 10 MG tablet Take 10 mg by mouth daily as needed for allergies.   Multiple Vitamins-Minerals (CENTRUM SILVER 50+WOMEN PO) Take 1 tablet by mouth daily.   nystatin-triamcinolone ointment (MYCOLOG) Apply topically 4 (four) times daily.   rosuvastatin (CRESTOR) 20 MG tablet Take 1 tablet (20 mg total) by mouth daily.   saccharomyces boulardii (FLORASTOR) 250 MG capsule Take 250 mg by mouth 2 (two) times daily.   triamcinolone cream (KENALOG) 0.1 % APPLY TOPICALLY TO THE AFFECTED AREA TWICE DAILY   TURMERIC CURCUMIN PO Take 1 tablet by mouth at bedtime.    No facility-administered encounter medications on file as of 04/27/2021.    Recent Office Vitals: BP Readings from Last 3 Encounters:  02/15/21 (!) 150/90  01/27/21 126/70  12/02/20 124/80   Pulse Readings from Last 3 Encounters:  02/15/21 70  01/27/21 60  12/02/20 87    Wt Readings from Last 3 Encounters:  02/15/21 182 lb (82.6 kg)  01/27/21 183 lb (83 kg)  12/02/20 181 lb (82.1 kg)     Kidney Function Lab Results  Component Value Date/Time   CREATININE 0.87 02/15/2021 10:46 AM   CREATININE 0.78 10/19/2020 08:39 AM   GFRNONAA 62 11/11/2019 09:08 AM   GFRAA 72 11/11/2019 09:08 AM    BMP Latest Ref Rng & Units 02/15/2021 10/19/2020 10/09/2020  Glucose 65 - 99 mg/dL 92 93 101(H)  BUN 8 - 27 mg/dL 17 14 18   Creatinine 0.57 - 1.00 mg/dL 0.87 0.78 1.14(H)  BUN/Creat Ratio 12 - 28 20 18 16   Sodium 134 - 144 mmol/L 142 141 141  Potassium 3.5 - 5.2 mmol/L 4.4 4.5 4.3  Chloride 96 - 106 mmol/L 104 103 106  CO2 20 - 29 mmol/L 26 23 22   Calcium 8.7 - 10.3 mg/dL 9.4 9.3 9.1     Current antihypertensive regimen:  Carvedilol 3.125 mg 2 times daily     Patient verbally confirms she is taking the above medications as directed.   How often are you  checking your Blood Pressure? infrequently  she checks her blood pressure in the morning before taking her medication.  Current home BP readings:   DATE:             BP               PULSE 04/27/21          160/86  49 04/26/21          159/72  47   Wrist or arm cuff: Arm Cuff  Caffeine intake:Drink decaffeinated  Salt intake:Is trying to watch her salt intake  OTC medications including pseudoephedrine or NSAIDs? Pt states she is taking Tylenol   Any readings above 180/120? No   What recent interventions/DTPs have been made by any provider to improve Blood Pressure control since last CPP Visit: Pt states no changes have been made   Any recent hospitalizations or ED visits since last visit with CPP? No  What diet changes  have been made to improve Blood Pressure Control?  Pt states there has not been any changes to her diet. She just tries to eat less salt on foods. What exercise is being done to improve your Blood Pressure Control?  Pt states she is exercising Tuesday and Thursdays at her church   Adherence Review: Is the patient currently on ACE/ARB medication? Yes Does the patient have >5 day gap between last estimated fill dates? No, she has an upcoming refill soon   Care Gaps: Last annual wellness visit? Scheduled  on 06/07/21  Star Rating Drugs:  Medication:  Last Fill: [facility] Rosuvastatin   02/16/21 [place]

## 2021-04-29 NOTE — Telephone Encounter (Signed)
BP is elevated, will let PCP know

## 2021-06-03 ENCOUNTER — Other Ambulatory Visit: Payer: Self-pay

## 2021-06-03 ENCOUNTER — Emergency Department (HOSPITAL_COMMUNITY)
Admission: EM | Admit: 2021-06-03 | Discharge: 2021-06-03 | Disposition: A | Discharge status: Home / Self Care | Payer: PPO | Attending: Emergency Medicine | Admitting: Emergency Medicine

## 2021-06-03 ENCOUNTER — Encounter (HOSPITAL_COMMUNITY): Payer: Self-pay | Admitting: Pharmacy Technician

## 2021-06-03 ENCOUNTER — Emergency Department (HOSPITAL_COMMUNITY): Payer: PPO

## 2021-06-03 DIAGNOSIS — I110 Hypertensive heart disease with heart failure: Secondary | ICD-10-CM | POA: Insufficient documentation

## 2021-06-03 DIAGNOSIS — R0789 Other chest pain: Secondary | ICD-10-CM | POA: Diagnosis not present

## 2021-06-03 DIAGNOSIS — I5042 Chronic combined systolic (congestive) and diastolic (congestive) heart failure: Secondary | ICD-10-CM | POA: Diagnosis not present

## 2021-06-03 DIAGNOSIS — J449 Chronic obstructive pulmonary disease, unspecified: Secondary | ICD-10-CM | POA: Insufficient documentation

## 2021-06-03 DIAGNOSIS — I517 Cardiomegaly: Secondary | ICD-10-CM | POA: Diagnosis not present

## 2021-06-03 DIAGNOSIS — I4430 Unspecified atrioventricular block: Secondary | ICD-10-CM | POA: Diagnosis not present

## 2021-06-03 DIAGNOSIS — N281 Cyst of kidney, acquired: Secondary | ICD-10-CM | POA: Diagnosis not present

## 2021-06-03 DIAGNOSIS — I4891 Unspecified atrial fibrillation: Secondary | ICD-10-CM | POA: Diagnosis not present

## 2021-06-03 DIAGNOSIS — R079 Chest pain, unspecified: Secondary | ICD-10-CM | POA: Diagnosis not present

## 2021-06-03 DIAGNOSIS — R110 Nausea: Secondary | ICD-10-CM | POA: Diagnosis not present

## 2021-06-03 DIAGNOSIS — Z7901 Long term (current) use of anticoagulants: Secondary | ICD-10-CM | POA: Insufficient documentation

## 2021-06-03 DIAGNOSIS — Z79899 Other long term (current) drug therapy: Secondary | ICD-10-CM | POA: Insufficient documentation

## 2021-06-03 DIAGNOSIS — R531 Weakness: Secondary | ICD-10-CM | POA: Insufficient documentation

## 2021-06-03 DIAGNOSIS — Z7951 Long term (current) use of inhaled steroids: Secondary | ICD-10-CM | POA: Diagnosis not present

## 2021-06-03 DIAGNOSIS — R0902 Hypoxemia: Secondary | ICD-10-CM | POA: Diagnosis not present

## 2021-06-03 DIAGNOSIS — I959 Hypotension, unspecified: Secondary | ICD-10-CM | POA: Diagnosis not present

## 2021-06-03 LAB — TROPONIN I (HIGH SENSITIVITY)
Troponin I (High Sensitivity): 6 ng/L (ref <18)
Troponin I (High Sensitivity): 6 ng/L (ref <18)

## 2021-06-03 LAB — COMPREHENSIVE METABOLIC PANEL
ALT: 50 U/L — ABNORMAL HIGH (ref 0–44)
AST: 85 U/L — ABNORMAL HIGH (ref 15–41)
Albumin: 3.1 g/dL — ABNORMAL LOW (ref 3.5–5.0)
Alkaline Phosphatase: 70 U/L (ref 38–126)
Anion gap: 5 (ref 5–15)
BUN: 14 mg/dL (ref 8–23)
CO2: 25 mmol/L (ref 22–32)
Calcium: 8.5 mg/dL — ABNORMAL LOW (ref 8.9–10.3)
Chloride: 108 mmol/L (ref 98–111)
Creatinine, Ser: 0.93 mg/dL (ref 0.44–1.00)
GFR, Estimated: >60 mL/min (ref >60)
Glucose, Bld: 120 mg/dL — ABNORMAL HIGH (ref 70–99)
Potassium: 4.1 mmol/L (ref 3.5–5.1)
Sodium: 138 mmol/L (ref 135–145)
Total Bilirubin: 0.9 mg/dL (ref 0.3–1.2)
Total Protein: 6.3 g/dL — ABNORMAL LOW (ref 6.5–8.1)

## 2021-06-03 LAB — CBC WITH DIFFERENTIAL/PLATELET
Abs Immature Granulocytes: 0.05 10*3/uL (ref 0.00–0.07)
Basophils Absolute: 0.1 10*3/uL (ref 0.0–0.1)
Basophils Relative: 1 %
Eosinophils Absolute: 0.1 10*3/uL (ref 0.0–0.5)
Eosinophils Relative: 1 %
HCT: 38.5 % (ref 36.0–46.0)
Hemoglobin: 12.6 g/dL (ref 12.0–15.0)
Immature Granulocytes: 1 %
Lymphocytes Relative: 9 %
Lymphs Abs: 1 10*3/uL (ref 0.7–4.0)
MCH: 32.6 pg (ref 26.0–34.0)
MCHC: 32.7 g/dL (ref 30.0–36.0)
MCV: 99.7 fL (ref 80.0–100.0)
Monocytes Absolute: 0.8 10*3/uL (ref 0.1–1.0)
Monocytes Relative: 7 %
Neutro Abs: 9 10*3/uL — ABNORMAL HIGH (ref 1.7–7.7)
Neutrophils Relative %: 81 %
Platelets: 136 10*3/uL — ABNORMAL LOW (ref 150–400)
RBC: 3.86 MIL/uL — ABNORMAL LOW (ref 3.87–5.11)
RDW: 14.5 % (ref 11.5–15.5)
WBC: 11 10*3/uL — ABNORMAL HIGH (ref 4.0–10.5)
nRBC: 0 % (ref 0.0–0.2)

## 2021-06-03 LAB — MAGNESIUM: Magnesium: 1.9 mg/dL (ref 1.7–2.4)

## 2021-06-03 LAB — PROTIME-INR
INR: 1.5 — ABNORMAL HIGH (ref 0.8–1.2)
Prothrombin Time: 17.9 seconds — ABNORMAL HIGH (ref 11.4–15.2)

## 2021-06-03 LAB — BRAIN NATRIURETIC PEPTIDE: B Natriuretic Peptide: 145.2 pg/mL — ABNORMAL HIGH (ref 0.0–100.0)

## 2021-06-03 MED ORDER — ASPIRIN 81 MG PO CHEW: 324.0000 mg | CHEWABLE_TABLET | Freq: Once | ORAL | Status: DC

## 2021-06-03 NOTE — ED Notes (Signed)
Patient Alert and oriented to baseline. Stable and ambulatory to baseline. Patient verbalized understanding of the discharge instructions.  Patient belongings were taken by the patient.   

## 2021-06-03 NOTE — ED Triage Notes (Signed)
Pt bib ems with sudden onset chest pressure and diaphoresis today. 12 lead shows intermittent 2nd degree type 2 heart block. Pt given 324mg  asa, 1 nitro, 1L NS en route with complete resolution of chest pain.  120/70 HR 48 RR 18 98% RA

## 2021-06-03 NOTE — Discharge Instructions (Signed)
As discussed, your evaluation today has been largely reassuring.  But, it is important that you monitor your condition carefully, and do not hesitate to return to the ED if you develop new, or concerning changes in your condition. ? ?Otherwise, please follow-up with your physician for appropriate ongoing care. ? ?

## 2021-06-03 NOTE — ED Provider Notes (Signed)
[facility] EMERGENCY DEPARTMENT Provider Note   CSN: [id number] Arrival date & time: 06/03/21  1025     History Chief Complaint  Patient presents with   Chest Pain    Stephanie Douglas is a 87 y.o. female.  HPI Patient presents with chest pain, nausea, weakness.  Onset was today, gradual, about 2 hours ago.  With worsening symptoms she called EMS.  She notes that in route after receiving aspirin and nitroglycerin x1 chest pain resolved.  She has a history of A. fib, states that she takes her medication as directed.  She has been well in the past few days.  The pain was pressure-like.   Past Medical History:  Diagnosis Date   COPD (chronic obstructive pulmonary disease) ([place])    Depression    HTN (hypertension)    Hyperlipidemia    PONV (postoperative nausea and vomiting)    PONV (postoperative nausea and vomiting)     Patient Active Problem List   Diagnosis Date Noted   Senile purpura ([place]) 12/02/2020   PONV (postoperative nausea and vomiting)    Depression    Routine general medical examination at a health care facility 06/01/2020   GAD (generalized anxiety disorder) 11/26/2019   Stye 11/26/2019   Mixed hyperlipidemia 11/11/2019   On amiodarone therapy 07/27/2017   Persistent atrial fibrillation (HCC)    Tricuspid regurgitation 06/19/2017   Chronic anticoagulation 06/19/2017   Chronic combined systolic and diastolic heart failure ([place]) 06/12/2017   Pleural effusion on right 06/12/2017   Hyponatremia 06/12/2017   Transaminitis 06/12/2017   Hypertensive heart disease with heart failure ([place]) 06/12/2017   COPD (chronic obstructive pulmonary disease) ([place]) 06/12/2017    Past Surgical History:  Procedure Laterality Date   ABDOMINAL HYSTERECTOMY     CARDIOVERSION N/A 06/13/2017   Procedure: CARDIOVERSION;  Surgeon: Lelon Perla, MD;  Location: [place];  Service: Cardiovascular;  Laterality: N/A;   CARDIOVERSION N/A 07/07/2017   Procedure:  CARDIOVERSION;  Surgeon: Sanda Klein, MD;  Location: [facility] ENDOSCOPY;  Service: Cardiovascular;  Laterality: N/A;   TEE WITHOUT CARDIOVERSION N/A 06/13/2017   Procedure: TRANSESOPHAGEAL ECHOCARDIOGRAM (TEE);  Surgeon: Lelon Perla, MD;  Location: [facility] ENDOSCOPY;  Service: Cardiovascular;  Laterality: N/A;     OB History   No obstetric history on file.     Family History  Problem Relation Age of Onset   CAD Mother    Cardiomyopathy Mother    Diabetes Mother    Asthma Father    Breast cancer Sister    Diabetes Brother    Hypertension Brother    AAA (abdominal aortic aneurysm) Sister     Social History   Tobacco Use   Smoking status: Never   Smokeless tobacco: Never  Vaping Use   Vaping Use: Never used  Substance Use Topics   Alcohol use: No   Drug use: No    Home Medications Prior to Admission medications   Medication Sig Start Date End Date Taking? Authorizing Provider  Acetaminophen (TYLENOL PO) Take 500 mg by mouth at bedtime.     [provider]  alendronate (FOSAMAX) 70 MG tablet Take 1 tablet (70 mg total) by mouth once a week. 02/15/21   Lillard Anes, MD  ALPRAZolam Duanne Moron) 0.25 MG tablet TAKE 1 TABLET(0.25 MG) BY MOUTH TWICE DAILY AS NEEDED FOR ANXIETY 03/09/21   Lillard Anes, MD  amiodarone (PACERONE) 200 MG tablet TAKE 1 TABLET BY MOUTH EVERY DAY 03/26/21   Richardo Priest,  MD  apixaban (ELIQUIS) 5 MG TABS tablet Take 1 tablet (5 mg total) by mouth 2 (two) times daily. 12/14/20   Lillard Anes, MD  Budeson-Glycopyrrol-Formoterol 160-9-4.8 MCG/ACT AERO Inhale 2 puffs into the lungs 2 (two) times daily. 11/03/20   Lillard Anes, MD  calcium carbonate (OSCAL) 1500 (600 Ca) MG TABS tablet Take by mouth 2 (two) times daily with a meal.    [provider]  carvedilol (COREG) 3.125 MG tablet Take 1 tablet (3.125 mg total) by mouth 2 (two) times daily with a meal. 10/09/20   Munley, Hilton Cork, MD  docusate sodium (COLACE)  250 MG capsule Take 250 mg by mouth daily as needed for constipation.    [provider]  furosemide (LASIX) 20 MG tablet TAKE 1 TABLET BY MOUTH THREE TIMES A WEEK, TAKE ANY DAY YOU WEIGH OVER 173 POUNDS OR LESS 09/08/20   Richardo Priest, MD  loratadine (CLARITIN) 10 MG tablet Take 10 mg by mouth daily as needed for allergies.    [provider]  Multiple Vitamins-Minerals (CENTRUM SILVER 50+WOMEN PO) Take 1 tablet by mouth daily.    [provider]  nystatin-triamcinolone ointment (MYCOLOG) Apply topically 4 (four) times daily. 09/15/20   [provider]  rosuvastatin (CRESTOR) 20 MG tablet Take 1 tablet (20 mg total) by mouth daily. 02/16/21   Lillard Anes, MD  saccharomyces boulardii (FLORASTOR) 250 MG capsule Take 250 mg by mouth 2 (two) times daily.    [provider]  triamcinolone cream (KENALOG) 0.1 % APPLY TOPICALLY TO THE AFFECTED AREA TWICE DAILY 04/12/21   Lillard Anes, MD  TURMERIC CURCUMIN PO Take 1 tablet by mouth at bedtime.    [provider]    Allergies    Patient has no known allergies.  Review of Systems   Review of Systems  Constitutional:        Per HPI, otherwise negative  HENT:         Per HPI, otherwise negative  Respiratory:         Per HPI, otherwise negative  Cardiovascular:        Per HPI, otherwise negative  Gastrointestinal:  Negative for vomiting.  Endocrine:       Negative aside from HPI  Genitourinary:        Neg aside from HPI   Musculoskeletal:        Per HPI, otherwise negative  Skin: Negative.   Neurological:  Negative for syncope.   Physical Exam Updated Vital Signs BP (!) 128/54   Pulse (!) 51   Temp 98 F (36.7 C) (Oral)   Resp 18   SpO2 98%   Physical Exam Vitals and nursing note reviewed.  Constitutional:      General: She is not in acute distress.    Appearance: She is well-developed.  HENT:     Head: Normocephalic and atraumatic.  Eyes:      Conjunctiva/sclera: Conjunctivae normal.  Cardiovascular:     Rate and Rhythm: Regular rhythm. Bradycardia present.  Pulmonary:     Effort: Pulmonary effort is normal. No respiratory distress.     Breath sounds: Normal breath sounds. No stridor.  Abdominal:     General: There is no distension.  Skin:    General: Skin is warm and dry.  Neurological:     Mental Status: She is alert and oriented to person, place, and time.     Cranial Nerves: No cranial nerve deficit.  ED Results / Procedures / Treatments   Labs (all labs ordered are listed, but only abnormal results are displayed) Labs Reviewed  COMPREHENSIVE METABOLIC PANEL - Abnormal; Notable for the following components:      Result Value   Glucose, Bld 120 (*)    Calcium 8.5 (*)    Total Protein 6.3 (*)    Albumin 3.1 (*)    AST 85 (*)    ALT 50 (*)    All other components within normal limits  BRAIN NATRIURETIC PEPTIDE - Abnormal; Notable for the following components:   B Natriuretic Peptide 145.2 (*)    All other components within normal limits  CBC WITH DIFFERENTIAL/PLATELET - Abnormal; Notable for the following components:   WBC 11.0 (*)    RBC 3.86 (*)    Platelets 136 (*)    Neutro Abs 9.0 (*)    All other components within normal limits  PROTIME-INR - Abnormal; Notable for the following components:   Prothrombin Time 17.9 (*)    INR 1.5 (*)    All other components within normal limits  MAGNESIUM  CBG MONITORING, ED  TROPONIN I (HIGH SENSITIVITY)  TROPONIN I (HIGH SENSITIVITY)    EKG EKG Interpretation  Date/Time:  Thursday June 03 2021 10:28:50 EDT Ventricular Rate:  51 PR Interval:    QRS Duration: 120 QT Interval:  515 QTC Calculation: 475 R Axis:   -42 Text Interpretation: Junctional rhythm Left anterior fascicular block Borderline T abnormalities, lateral leads Artifact Confirmed by Carmin Muskrat [phone]) on 06/03/2021 10:35:45 AM  Radiology US Abdomen Complete  Result Date:  06/03/2021 CLINICAL DATA:  Nausea, transaminitis. EXAM: ABDOMEN ULTRASOUND COMPLETE COMPARISON:  None. FINDINGS: Gallbladder: No gallstones or wall thickening visualized. No sonographic Murphy sign noted by sonographer. Common bile duct: Diameter: 6 mm which is within normal limits. Liver: No focal lesion identified. Within normal limits in parenchymal echogenicity. Portal vein is patent on color Doppler imaging with normal direction of blood flow towards the liver. IVC: No abnormality visualized. Pancreas: Visualized portion unremarkable. Spleen: Size and appearance within normal limits. Right Kidney: Length: 10.5 cm. Echogenicity within normal limits. No mass or hydronephrosis visualized. Left Kidney: Length: 10.9 cm. Two simple cysts are noted, the largest measuring 5.1 cm. Echogenicity within normal limits. No mass or hydronephrosis visualized. Abdominal aorta: No aneurysm visualized. Other findings: None. IMPRESSION: No acute abnormality seen in the abdomen. Right renal cysts are noted. Electronically Signed   By: Marijo Conception M.D.   On: 06/03/2021 13:18   DG Chest Portable 1 View  Result Date: 06/03/2021 CLINICAL DATA:  Chest pain. EXAM: PORTABLE CHEST 1 VIEW COMPARISON:  07/23/2017 FINDINGS: The heart is enlarged but stable. Stable tortuosity and calcification of the thoracic aorta. The pulmonary hila appear normal. No acute pulmonary findings. No pleural effusions. Streaky left basilar scarring changes. The bony thorax is intact. IMPRESSION: 1. Stable cardiac enlargement. 2. No acute pulmonary findings. Electronically Signed   By: Marijo Sanes M.D.   On: 06/03/2021 10:52    Procedures Procedures   Medications Ordered in ED Medications  aspirin chewable tablet 324 mg (324 mg Oral Not Given 06/03/21 1038)    ED Course  I have reviewed the triage vital signs and the nursing notes.  Pertinent labs & imaging results that were available during my care of the patient were reviewed by me and  considered in my medical decision making (see chart for details).  Cardiac junctional bradycardia rate 50s, abnormal Pulse ox 99% room air  normal  Update: Initial labs notable for mild transaminitis, leukocytosis, with nausea, ultrasound ordered.  3:31 PM Patient in no distress, awake, alert, speaking clearly.  On reviewing her chart she has a history of prior episodes of transaminitis.  She remains in no distress, no recurrence of her chest pain.  2 troponins are normal, EKG is nonischemic, she has slight elevation in BNP, but x-ray does not suggest overt pulmonary congestion and patient will continue taking Lasix. Patient's improvement here, absence of alarming findings, absence of substantial changes of her hours of monitoring I will make her an appropriate candidate for discharge close outpatient follow-up.  We discussed the importance of following with primary care for consideration of cardiac monitoring given unclear complete picture of what occurred earlier in the day prior to her ED transfer. Patient amenable to plan for discharge. MDM Rules/Calculators/A&P MDM Number of Diagnoses or Management Options Atypical chest pain: new, needed workup   Amount and/or Complexity of Data Reviewed Clinical lab tests: ordered and reviewed Tests in the radiology section of CPT: ordered and reviewed Tests in the medicine section of CPT: reviewed and ordered Decide to obtain previous medical records or to obtain history from someone other than the patient: yes Obtain history from someone other than the patient: yes Review and summarize past medical records: yes Independent visualization of images, tracings, or specimens: yes  Risk of Complications, Morbidity, and/or Mortality Presenting problems: high Diagnostic procedures: high Management options: high  Critical Care Total time providing critical care: < 30 minutes  Patient Progress Patient progress: improved   Final Clinical  Impression(s) / ED Diagnoses Final diagnoses:  Atypical chest pain     Carmin Muskrat, MD 06/03/21 1533

## 2021-06-08 ENCOUNTER — Telehealth: Payer: Self-pay

## 2021-06-08 NOTE — Chronic Care Management (AMB) (Signed)
    Chronic Care Management Pharmacy Assistant   Name: Stephanie Douglas  MRN: [id number] DOB: 02/25/1934   Patient Assistance Documentation: Eliquis 2023 Renewal   06/08/21 I have filled out 2023 PAP renewal for pt Eliquis through [facility] and will be mailed out to pt. I have called pt and informed her and pt understood.  Medications: Outpatient Encounter Medications as of 06/08/2021  Medication Sig   Acetaminophen (TYLENOL PO) Take 500 mg by mouth at bedtime.    alendronate (FOSAMAX) 70 MG tablet Take 1 tablet (70 mg total) by mouth once a week.   ALPRAZolam (XANAX) 0.25 MG tablet TAKE 1 TABLET(0.25 MG) BY MOUTH TWICE DAILY AS NEEDED FOR ANXIETY   amiodarone (PACERONE) 200 MG tablet TAKE 1 TABLET BY MOUTH EVERY DAY   apixaban (ELIQUIS) 5 MG TABS tablet Take 1 tablet (5 mg total) by mouth 2 (two) times daily.   Budeson-Glycopyrrol-Formoterol 160-9-4.8 MCG/ACT AERO Inhale 2 puffs into the lungs 2 (two) times daily.   calcium carbonate (OSCAL) 1500 (600 Ca) MG TABS tablet Take by mouth 2 (two) times daily with a meal.   carvedilol (COREG) 3.125 MG tablet Take 1 tablet (3.125 mg total) by mouth 2 (two) times daily with a meal.   docusate sodium (COLACE) 250 MG capsule Take 250 mg by mouth daily as needed for constipation.   furosemide (LASIX) 20 MG tablet TAKE 1 TABLET BY MOUTH THREE TIMES A WEEK, TAKE ANY DAY YOU WEIGH OVER 173 POUNDS OR LESS   loratadine (CLARITIN) 10 MG tablet Take 10 mg by mouth daily as needed for allergies.   Multiple Vitamins-Minerals (CENTRUM SILVER 50+WOMEN PO) Take 1 tablet by mouth daily.   nystatin-triamcinolone ointment (MYCOLOG) Apply topically 4 (four) times daily.   rosuvastatin (CRESTOR) 20 MG tablet Take 1 tablet (20 mg total) by mouth daily.   saccharomyces boulardii (FLORASTOR) 250 MG capsule Take 250 mg by mouth 2 (two) times daily.   triamcinolone cream (KENALOG) 0.1 % APPLY TOPICALLY TO THE AFFECTED AREA TWICE DAILY   TURMERIC CURCUMIN PO Take 1  tablet by mouth at bedtime.   No facility-administered encounter medications on file as of 06/08/2021.    Elray Mcgregor, [place] Pharmacist Assistant  [phone]

## 2021-06-16 ENCOUNTER — Ambulatory Visit: Payer: PPO | Admitting: Legal Medicine

## 2021-06-21 ENCOUNTER — Telehealth: Payer: Self-pay

## 2021-06-21 NOTE — Progress Notes (Signed)
LVM reminding pt of appt tomorrow with CPP.  Elray Mcgregor, [place] Pharmacist Assistant  [phone]

## 2021-06-22 ENCOUNTER — Other Ambulatory Visit: Payer: Self-pay | Admitting: Legal Medicine

## 2021-06-22 ENCOUNTER — Ambulatory Visit (INDEPENDENT_AMBULATORY_CARE_PROVIDER_SITE_OTHER): Payer: PPO

## 2021-06-22 ENCOUNTER — Other Ambulatory Visit: Payer: Self-pay

## 2021-06-22 DIAGNOSIS — I110 Hypertensive heart disease with heart failure: Secondary | ICD-10-CM

## 2021-06-22 DIAGNOSIS — I4819 Other persistent atrial fibrillation: Secondary | ICD-10-CM

## 2021-06-22 DIAGNOSIS — M810 Age-related osteoporosis without current pathological fracture: Secondary | ICD-10-CM

## 2021-06-22 DIAGNOSIS — Z7901 Long term (current) use of anticoagulants: Secondary | ICD-10-CM

## 2021-06-22 DIAGNOSIS — E782 Mixed hyperlipidemia: Secondary | ICD-10-CM

## 2021-06-22 DIAGNOSIS — J449 Chronic obstructive pulmonary disease, unspecified: Secondary | ICD-10-CM

## 2021-06-22 DIAGNOSIS — F411 Generalized anxiety disorder: Secondary | ICD-10-CM

## 2021-06-22 DIAGNOSIS — Z79899 Other long term (current) drug therapy: Secondary | ICD-10-CM

## 2021-06-22 MED ORDER — ALPRAZOLAM 0.25 MG PO TABS: ORAL_TABLET | ORAL | 3 refills | Status: DC

## 2021-06-22 MED ORDER — AMIODARONE HCL 200 MG PO TABS: 200.0000 mg | ORAL_TABLET | Freq: Every day | ORAL | 2 refills | Status: DC

## 2021-06-22 MED ORDER — LORATADINE 10 MG PO TABS: 10.0000 mg | ORAL_TABLET | Freq: Every day | ORAL | 2 refills | Status: AC | PRN

## 2021-06-22 MED ORDER — ROSUVASTATIN CALCIUM 20 MG PO TABS: 20.0000 mg | ORAL_TABLET | Freq: Every day | ORAL | 3 refills | Status: DC

## 2021-06-22 MED ORDER — FUROSEMIDE 20 MG PO TABS: ORAL_TABLET | ORAL | 3 refills | Status: DC

## 2021-06-22 MED ORDER — APIXABAN 5 MG PO TABS: 5.0000 mg | ORAL_TABLET | Freq: Two times a day (BID) | ORAL | 2 refills | Status: AC

## 2021-06-22 MED ORDER — ALENDRONATE SODIUM 70 MG PO TABS: 70.0000 mg | ORAL_TABLET | ORAL | 1 refills | Status: DC

## 2021-06-22 MED ORDER — BUDESON-GLYCOPYRROL-FORMOTEROL 160-9-4.8 MCG/ACT IN AERO: 2.0000 | INHALATION_SPRAY | Freq: Two times a day (BID) | RESPIRATORY_TRACT | 2 refills | Status: DC

## 2021-06-22 MED ORDER — CARVEDILOL 3.125 MG PO TABS: 3.1250 mg | ORAL_TABLET | Freq: Two times a day (BID) | ORAL | 3 refills | Status: AC

## 2021-06-22 NOTE — Progress Notes (Signed)
Chronic Care Management Pharmacy Note  06/22/2021 Name:  Stephanie Douglas MRN:  [id number] DOB:  1934/02/24   Summary:  -Patient threw out paperwork for PAP's that were mailed to her. Will re-do forms and mail them out  -Because patient hates paperwork and the amount of meds she takes confuses her, she would like to try out [facility]. Will ask PCP to send 90 day scripts of all meds to [facility]   Subjective: Stephanie Douglas is an 87 y.o. year old female who is a primary patient of Henrene Pastor, Zeb Comfort, MD.  The CCM team was consulted for assistance with disease management and care coordination needs.    Engaged with patient by telephone for follow up visit in response to provider referral for pharmacy case management and/or care coordination services.   Consent to Services:  The patient was given information about Chronic Care Management services, agreed to services, and gave verbal consent prior to initiation of services.  Please see initial visit note for detailed documentation.   Patient Care Team: Lillard Anes, MD as PCP - General (Family Medicine) Burnice Logan, [facility] (Inactive) as Pharmacist (Pharmacist)  Recent office visits: 06/2020 - AWV with Dr. Henrene Pastor.   Recent consult visits: 10/09/2020 - Cardio -  Stable maintaining sinus rhythm. Follow-up in 1 year.  Hospital visits: None in previous 6 months  Objective:  Lab Results  Component Value Date   CREATININE 0.93 06/03/2021   BUN 14 06/03/2021   GFRNONAA >60 06/03/2021   GFRAA 72 11/11/2019   NA 138 06/03/2021   K 4.1 06/03/2021   CALCIUM 8.5 (L) 06/03/2021   CO2 25 06/03/2021    No results found for: HGBA1C, FRUCTOSAMINE, GFR, MICROALBUR  Last diabetic Eye exam: No results found for: HMDIABEYEEXA  Last diabetic Foot exam: No results found for: HMDIABFOOTEX   Lab Results  Component Value Date   CHOL 195 02/15/2021   HDL 38 (L) 02/15/2021   LDLCALC 142 (H) 02/15/2021   TRIG 84 02/15/2021    CHOLHDL 5.1 (H) 02/15/2021    Hepatic Function Latest Ref Rng & Units 06/03/2021 02/15/2021 10/19/2020  Total Protein 6.5 - 8.1 g/dL 6.3(L) 7.0 7.0  Albumin 3.5 - 5.0 g/dL 3.1(L) 4.0 4.2  AST 15 - 41 U/L 85(H) 29 27  ALT 0 - 44 U/L 50(H) 26 26  Alk Phosphatase 38 - 126 U/L 70 71 75  Total Bilirubin 0.3 - 1.2 mg/dL 0.9 0.5 0.5    Lab Results  Component Value Date/Time   TSH 3.540 10/09/2020 01:26 PM   TSH 3.310 09/27/2019 03:57 PM   FREET4 1.45 10/09/2020 01:26 PM    CBC Latest Ref Rng & Units 06/03/2021 02/15/2021 10/19/2020  WBC 4.0 - 10.5 K/uL 11.0(H) 7.3 7.0  Hemoglobin 12.0 - 15.0 g/dL 12.6 14.3 14.4  Hematocrit 36.0 - 46.0 % 38.5 43.5 42.5  Platelets 150 - 400 K/uL 136(L) 160 160    No results found for: VD25OH  Clinical ASCVD: No  The ASCVD Risk score (Arnett DK, et al., 2019) failed to calculate for the following reasons:   The 2019 ASCVD risk score is only valid for ages 11 to 74    Depression screen PHQ 2/9 02/15/2021 06/01/2020 12/16/2019  Decreased Interest 2 0 1  Down, Depressed, Hopeless 2 0 1  PHQ - 2 Score 4 0 2  Altered sleeping 2 - 3  Tired, decreased energy 1 - 3  Change in appetite 0 - 0  Feeling bad or failure  about yourself  3 - 1  Trouble concentrating 2 - 1  Moving slowly or fidgety/restless 2 - 1  Suicidal thoughts 0 - 0  PHQ-9 Score 14 - 11  Difficult doing work/chores Somewhat difficult - Somewhat difficult     CHA2DS2-VASc Score = 5  Social History   Tobacco Use  Smoking Status Never  Smokeless Tobacco Never   BP Readings from Last 3 Encounters:  06/03/21 134/67  02/15/21 (!) 150/90  01/27/21 126/70   Pulse Readings from Last 3 Encounters:  06/03/21 (!) 58  02/15/21 70  01/27/21 60   Wt Readings from Last 3 Encounters:  02/15/21 182 lb (82.6 kg)  01/27/21 183 lb (83 kg)  12/02/20 181 lb (82.1 kg)    Assessment/Interventions: Review of patient past medical history, allergies, medications, health status, including review of  consultants reports, laboratory and other test data, was performed as part of comprehensive evaluation and provision of chronic care management services.   SDOH:  (Social Determinants of Health) assessments and interventions performed: Yes   CCM Care Plan  No Known Allergies  Medications Reviewed Today     Reviewed by Thomasene Mohair, CPhT (Pharmacy Technician) on 06/03/21 at 1413  Med List Status: <None>   Medication Order Taking? Sig Documenting Provider Last Dose Status Informant  Acetaminophen (TYLENOL PO) [id number]  Take 500 mg by mouth at bedtime.  [provider]  Active Self  alendronate (FOSAMAX) 70 MG tablet [id number]  Take 1 tablet (70 mg total) by mouth once a week. Abigail Miyamoto, MD  Active   ALPRAZolam Prudy Feeler) 0.25 MG tablet [id number]  TAKE 1 TABLET(0.25 MG) BY MOUTH TWICE DAILY AS NEEDED FOR ANXIETY Abigail Miyamoto, MD  Active   amiodarone (PACERONE) 200 MG tablet [id number]  TAKE 1 TABLET BY MOUTH EVERY DAY Dulce Sellar Iline Oven, MD  Active   apixaban (ELIQUIS) 5 MG TABS tablet [id number]  Take 1 tablet (5 mg total) by mouth 2 (two) times daily. Abigail Miyamoto, MD  Active   Budeson-Glycopyrrol-Formoterol 160-9-4.8 MCG/ACT Sandrea Matte [id number]  Inhale 2 puffs into the lungs 2 (two) times daily. Abigail Miyamoto, MD  Active   calcium carbonate (OSCAL) 1500 (600 Ca) MG TABS tablet [id number]  Take by mouth 2 (two) times daily with a meal. [provider]  Active   carvedilol (COREG) 3.125 MG tablet [id number]  Take 1 tablet (3.125 mg total) by mouth 2 (two) times daily with a meal. Baldo Daub, MD  Active   docusate sodium (COLACE) 250 MG capsule [id number]  Take 250 mg by mouth daily as needed for constipation. [provider]  Active   furosemide (LASIX) 20 MG tablet [id number]  TAKE 1 TABLET BY MOUTH THREE TIMES A WEEK, TAKE ANY DAY YOU WEIGH OVER 173 POUNDS OR LESS Dulce Sellar Iline Oven, MD  Active   loratadine (CLARITIN) 10 MG  tablet [id number]  Take 10 mg by mouth daily as needed for allergies. [provider]  Active   Multiple Vitamins-Minerals (CENTRUM SILVER 50+WOMEN PO) [id number]  Take 1 tablet by mouth daily. [provider]  Active   nystatin-triamcinolone ointment Arvilla Market) [id number]  Apply topically 4 (four) times daily. [provider]  Active   rosuvastatin (CRESTOR) 20 MG tablet [id number]  Take 1 tablet (20 mg total) by mouth daily. Abigail Miyamoto, MD  Active   saccharomyces boulardii (FLORASTOR) 250 MG capsule [id number]  Take 250 mg by mouth 2 (two) times daily. [provider]  Active  triamcinolone cream (KENALOG) 0.1 % [id number]  APPLY TOPICALLY TO THE AFFECTED AREA TWICE DAILY Lillard Anes, MD  Active   TURMERIC CURCUMIN PO [id number]  Take 1 tablet by mouth at bedtime. [provider]  Active             Patient Active Problem List   Diagnosis Date Noted   Senile purpura ([place]) 12/02/2020   PONV (postoperative nausea and vomiting)    Depression    Routine general medical examination at a health care facility 06/01/2020   GAD (generalized anxiety disorder) 11/26/2019   Stye 11/26/2019   Mixed hyperlipidemia 11/11/2019   On amiodarone therapy 07/27/2017   Persistent atrial fibrillation (HCC)    Tricuspid regurgitation 06/19/2017   Chronic anticoagulation 06/19/2017   Chronic combined systolic and diastolic heart failure ([place]) 06/12/2017   Pleural effusion on right 06/12/2017   Hyponatremia 06/12/2017   Transaminitis 06/12/2017   Hypertensive heart disease with heart failure ([place]) 06/12/2017   COPD (chronic obstructive pulmonary disease) ([place]) 06/12/2017    Immunization History  Administered Date(s) Administered   Fluad Quad(high Dose 65+) 06/17/2020   Influenza, High Dose Seasonal PF 04/22/2017   PFIZER Comirnaty(Gray Top)Covid-19 Tri-Sucrose Vaccine 12/02/2020   PFIZER(Purple Top)SARS-COV-2 Vaccination 08/16/2019,  09/09/2019, 04/28/2020   Pneumococcal Polysaccharide-23 02/15/2021    Conditions to be addressed/monitored:  Hypertension, Hyperlipidemia, Atrial Fibrillation, Heart Failure, COPD and Depression  Care Plan : [place]  Updates made by Lane Hacker, [place] since 06/22/2021 12:00 AM     Problem: afib, hld, copd, chf   Priority: High  Onset Date: 10/19/2020     Long-Range Goal: Disease Management   Start Date: 10/19/2020  Expected End Date: 10/19/2021  Recent Progress: On track  Priority: High  Note:    Pharmacist Clinical Goal(s):  Over the next 90 days, patient will verbalize ability to afford treatment regimen through collaboration with PharmD and provider.   Interventions: 1:1 collaboration with Lillard Anes, MD regarding development and update of comprehensive plan of care as evidenced by provider attestation and co-signature Inter-disciplinary care team collaboration (see longitudinal plan of care) Comprehensive medication review performed; medication list updated in electronic medical record  Hyperlipidemia: (LDL goal < 100) -Controlled -Current treatment: Rosuvastatin 20mg  -Medications previously tried: Atorvastatin 20mg  -Current dietary patterns: tries to limit sodium and high fat foods in her diet  -Current exercise habits: attends silver sneaker class 2 times each week -Educated on Cholesterol goals;  Benefits of statin for ASCVD risk reduction; Importance of limiting foods high in cholesterol; Exercise goal of 150 minutes per week; -Counseled on diet and exercise extensively Recommended to continue current medication  Atrial Fibrillation (Goal: prevent stroke and major bleeding) -Controlled -CHADSVASC: 5 -Current treatment: Rate control:  amiodarone 200 mg daily carvedilol 3.125 mg bid  Anticoagulation:  Eliquis 5 mg bid  (PAP) -Medications previously tried: none reported -Home BP and HR readings: well controlled per patient - does  not have readings present  -Counseled on increased risk of stroke due to Afib and benefits of anticoagulation for stroke prevention; importance of adherence to anticoagulant exactly as prescribed; seeking medical attention after a head injury or if there is blood in the urine/stool; -Recommended to continue current medication November 2022: PAP needs to be renewed, patient threw out paperwork from [place] earlier this month  Heart Failure (Goal: manage symptoms and prevent exacerbations) -Controlled -Last ejection fraction: 35-40% -HF type: Hypertensive Heart disease with heart failure -Current treatment: carvedilol 3.125 mg bid  Furosemide  20 mg three times a week  -Medications previously tried: none reported  -Current home BP/HR readings: well controlled per patietn  -Current dietary habits: watches salt intake and tries to eat healthy -Current exercise habits: silver sneakers class twice weekly -Educated on Benefits of medications for managing symptoms and prolonging life Importance of weighing daily; if you gain more than 3 pounds in one day or 5 pounds in one week,   Importance of blood pressure control -Counseled on diet and exercise extensively Recommended to continue current medication  COPD (Goal: control symptoms and prevent exacerbations) -Controlled -Current treatment  Breztri 2 puffs twice daily (PAP) -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 Assisted patient with insurance form to report patient assistance from [place] and Me. Pharmacist faxed form from office 10/19/2020.  November 2022: PAP needs to be renewed, patient threw out paperwork from [place] earlier this  month    Patient Goals/Self-Care Activities Over the next 90 days, patient will:  - take medications as prescribed focus on medication adherence by using pill box check blood pressure daily , document, and provide at future appointments collaborate with provider on medication access solutions target a minimum of 150 minutes of moderate intensity exercise weekly engage in dietary modifications by limiting sodium and fat intake   Follow Up Plan: Telephone follow up appointment with care management team member scheduled for: March 2023  [place] Constable, Sherian Rein.D. - [phone]       Medication Assistance:  Breztriobtained through [place] and Me medication assistance program.  Enrollment ends 07/31/2021 . Pharmacist will submit Eliquis application for 7026 once 3% of income spent on medications. Patient aware to let pharmacist know when this is met.   Patient's preferred pharmacy is:  [facility] [place],  - [address] AT [place] [address] [place] [address] Phone: [phone] Fax: [phone]  [facility] - [place], [place] [address] [place] [address] Phone: [phone] Fax: [phone]  Uses pill box? Yes Pt endorses good compliance  We discussed: Benefits of medication synchronization, packaging and delivery as well as enhanced pharmacist oversight with [facility]. Patient decided to: Utilize [facility] pharmacy for medication synchronization, packaging and delivery Verbal consent obtained for [facility] enhanced pharmacy services (medication synchronization, adherence packaging, delivery coordination). A medication sync plan was created to allow patient to get all medications delivered once every 30 to 90 days per patient preference. Patient understands they have freedom to choose pharmacy and clinical pharmacist will coordinate care between all prescribers and [facility]. Medication Name                         (please note if Rx is PRN) Prescriber                                                                  (list Provider Name & Phone Number)  Timing    Refill Timing Last Fill Date & DS       (if last fill/DS unavailable, list pt.'s quantity on hand) Anticipated next due date    BB B L EM BT   Days Supply   Alendronate $RemoveBefo'70mg'uWrfVTtRsyz$   Henrene Pastor [phone]      Cycle Fill 05/26/2021 84.00 08/18/21  Alprazolam 0.$RemoveBefor'25mg'TAbRKupNuhoD$  BID PRN Henrene Pastor [phone]      PRN 04/28/2021 30.00 05/28/21  Amiodarone $RemoveBef'200mg'WdyGhQbSuX$  QD Henrene Pastor [phone]      Cycle Fill 03/26/2021 90.00 06/24/21  Carvedilol 3.$RemoveBefor'125mg'CVSgwVWkyFYD$  BID Henrene Pastor [phone]      Cycle Fill 06/09/2021 90.00 09/07/21  Furosemide $RemoveBef'20mg'mkTGcKVuts$  3x/week Henrene Pastor [phone]      PRN 04/28/2021 90.00 07/27/21  Rosuvastatin $RemoveBefor'20mg'NXopPjGJdpcB$  QD Henrene Pastor [phone]      Cycle Fill 04/02/2021 90.00 07/01/21    Care Plan and Follow Up Patient Decision:  Patient agrees to Care Plan and Follow-up.  Plan: Telephone follow up appointment with care management team member scheduled for:  March 2023  [place] Constable, [place].D. - [phone]

## 2021-06-22 NOTE — Patient Instructions (Signed)
Visit Information   Goals Addressed   None    Patient Care Plan: CCM Pharmacy Care Plan     Problem Identified: afib, hld, copd, chf   Priority: High  Onset Date: 10/19/2020     Long-Range Goal: Disease Management   Start Date: 10/19/2020  Expected End Date: 10/19/2021  Recent Progress: On track  Priority: High  Note:    Pharmacist Clinical Goal(s):  Over the next 90 days, patient will verbalize ability to afford treatment regimen through collaboration with PharmD and provider.   Interventions: 1:1 collaboration with Lillard Anes, MD regarding development and update of comprehensive plan of care as evidenced by provider attestation and co-signature Inter-disciplinary care team collaboration (see longitudinal plan of care) Comprehensive medication review performed; medication list updated in electronic medical record  Hyperlipidemia: (LDL goal < 100) -Controlled -Current treatment: Rosuvastatin 20mg  -Medications previously tried: Atorvastatin 20mg  -Current dietary patterns: tries to limit sodium and high fat foods in her diet  -Current exercise habits: attends silver sneaker class 2 times each week -Educated on Cholesterol goals;  Benefits of statin for ASCVD risk reduction; Importance of limiting foods high in cholesterol; Exercise goal of 150 minutes per week; -Counseled on diet and exercise extensively Recommended to continue current medication  Atrial Fibrillation (Goal: prevent stroke and major bleeding) -Controlled -CHADSVASC: 5 -Current treatment: Rate control:  amiodarone 200 mg daily carvedilol 3.125 mg bid  Anticoagulation:  Eliquis 5 mg bid  (PAP) -Medications previously tried: none reported -Home BP and HR readings: well controlled per patient - does not have readings present  -Counseled on increased risk of stroke due to Afib and benefits of anticoagulation for stroke prevention; importance of adherence to anticoagulant exactly as  prescribed; seeking medical attention after a head injury or if there is blood in the urine/stool; -Recommended to continue current medication November 2022: PAP needs to be renewed, patient threw out paperwork from [place] earlier this month  Heart Failure (Goal: manage symptoms and prevent exacerbations) -Controlled -Last ejection fraction: 35-40% -HF type: Hypertensive Heart disease with heart failure -Current treatment: carvedilol 3.125 mg bid  Furosemide 20 mg three times a week  -Medications previously tried: none reported  -Current home BP/HR readings: well controlled per patietn  -Current dietary habits: watches salt intake and tries to eat healthy -Current exercise habits: silver sneakers class twice weekly -Educated on Benefits of medications for managing symptoms and prolonging life Importance of weighing daily; if you gain more than 3 pounds in one day or 5 pounds in one week,   Importance of blood pressure control -Counseled on diet and exercise extensively Recommended to continue current medication  COPD (Goal: control symptoms and prevent exacerbations) -Controlled -Current treatment  Breztri 2 puffs twice daily (PAP) -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 Assisted patient with insurance form to report patient assistance from [place] and [place]. Pharmacist faxed form from office 10/19/2020.  November 2022: PAP needs to be renewed, patient threw out paperwork from [place] earlier this month    Patient Goals/Self-Care Activities Over the next 90 days, patient will:  - take medications as prescribed focus on medication adherence by using pill box check blood  pressure daily , document, and provide at future appointments collaborate with provider on medication access solutions target  a minimum of 150 minutes of moderate intensity exercise weekly engage in dietary modifications by limiting sodium and fat intake   Follow Up Plan: Telephone follow up appointment with care management team member scheduled for: March 2023  [place] Constable, Sherian Rein.D. - [phone]       The patient verbalized understanding of instructions, educational materials, and care plan provided today and declined offer to receive copy of patient instructions, educational materials, and care plan.  The pharmacy team will reach out to the patient again over the next 90 days.   Lane Hacker, [facility]

## 2021-06-23 ENCOUNTER — Other Ambulatory Visit: Payer: Self-pay

## 2021-06-23 ENCOUNTER — Ambulatory Visit (INDEPENDENT_AMBULATORY_CARE_PROVIDER_SITE_OTHER): Payer: PPO | Admitting: Legal Medicine

## 2021-06-23 ENCOUNTER — Encounter: Payer: Self-pay | Admitting: Legal Medicine

## 2021-06-23 VITALS — BP 130/70 | HR 72 | Temp 98.3°F | Resp 16 | Ht 65.0 in | Wt 181.0 lb

## 2021-06-23 DIAGNOSIS — J449 Chronic obstructive pulmonary disease, unspecified: Secondary | ICD-10-CM

## 2021-06-23 DIAGNOSIS — I110 Hypertensive heart disease with heart failure: Secondary | ICD-10-CM

## 2021-06-23 MED ORDER — DOXYCYCLINE HYCLATE 100 MG PO TABS: 100.0000 mg | ORAL_TABLET | Freq: Two times a day (BID) | ORAL | 0 refills | Status: DC

## 2021-06-23 NOTE — Progress Notes (Signed)
Established Patient Office Visit  Subjective:  Patient ID: Stephanie Douglas, female    DOB: 24-Jul-1934  Age: 87 y.o. MRN: [id number]  CC:  Chief Complaint  Patient presents with   Follow-up   Chest Pain    HPI Stephanie Douglas presents for follow up of chest pain from [place] ER visit. Pain resolved with one nitroglycerine.  Stephanie Douglas has atrial fibrillation.  No radiation of pain. Stephanie Douglas was sob., Stephanie Douglas was going to exercise but did not go.  Negative workup in ER.  No further pain. Stephanie Douglas has not see cardiology since early this year.  Stephanie Douglas sees Dr. Dulce Sellar.  No history of MI in past.  No orthopnea or PND. Her liver functions and WBC elevated.  Slight cough.  Past Medical History:  Diagnosis Date   COPD (chronic obstructive pulmonary disease) (HCC)    Depression    HTN (hypertension)    Hyperlipidemia    PONV (postoperative nausea and vomiting)    PONV (postoperative nausea and vomiting)     Past Surgical History:  Procedure Laterality Date   ABDOMINAL HYSTERECTOMY     CARDIOVERSION N/A 06/13/2017   Procedure: CARDIOVERSION;  Surgeon: Lewayne Bunting, MD;  Location: [facility] ENDOSCOPY;  Service: Cardiovascular;  Laterality: N/A;   CARDIOVERSION N/A 07/07/2017   Procedure: CARDIOVERSION;  Surgeon: Thurmon Fair, MD;  Location: [facility] ENDOSCOPY;  Service: Cardiovascular;  Laterality: N/A;   TEE WITHOUT CARDIOVERSION N/A 06/13/2017   Procedure: TRANSESOPHAGEAL ECHOCARDIOGRAM (TEE);  Surgeon: Lewayne Bunting, MD;  Location: [facility] ENDOSCOPY;  Service: Cardiovascular;  Laterality: N/A;    Family History  Problem Relation Age of Onset   CAD Mother    Cardiomyopathy Mother    Diabetes Mother    Asthma Father    Breast cancer Sister    Diabetes Brother    Hypertension Brother    AAA (abdominal aortic aneurysm) Sister     Social History   Socioeconomic History   Marital status: Widowed    Spouse name: Not on file   Number of children: Not on file   Years of education: Not on file   Highest  education level: Not on file  Occupational History   Not on file  Tobacco Use   Smoking status: Never   Smokeless tobacco: Never  Vaping Use   Vaping Use: Never used  Substance and Sexual Activity   Alcohol use: No   Drug use: No   Sexual activity: Not Currently  Other Topics Concern   Not on file  Social History Narrative   Not on file   Social Determinants of Health   Financial Resource Strain: Not on file  Food Insecurity: Not on file  Transportation Needs: Not on file  Physical Activity: Not on file  Stress: Not on file  Social Connections: Not on file  Intimate Partner Violence: Not on file    Outpatient Medications Prior to Visit  Medication Sig Dispense Refill   Acetaminophen (TYLENOL PO) Take 500 mg by mouth at bedtime.      alendronate (FOSAMAX) 70 MG tablet Take 1 tablet (70 mg total) by mouth once a week. 12 tablet 1   ALPRAZolam (XANAX) 0.25 MG tablet TAKE 1 TABLET(0.25 MG) BY MOUTH TWICE DAILY AS NEEDED FOR ANXIETY 60 tablet 3   amiodarone (PACERONE) 200 MG tablet Take 1 tablet (200 mg total) by mouth daily. 90 tablet 2   apixaban (ELIQUIS) 5 MG TABS tablet Take 1 tablet (5 mg total) by mouth 2 (two) times daily.  180 tablet 2   Budeson-Glycopyrrol-Formoterol 160-9-4.8 MCG/ACT AERO Inhale 2 puffs into the lungs 2 (two) times daily. 10.7 g 2   calcium carbonate (OSCAL) 1500 (600 Ca) MG TABS tablet Take by mouth 2 (two) times daily with a meal.     carvedilol (COREG) 3.125 MG tablet Take 1 tablet (3.125 mg total) by mouth 2 (two) times daily with a meal. 180 tablet 3   docusate sodium (COLACE) 250 MG capsule Take 250 mg by mouth daily as needed for constipation.     furosemide (LASIX) 20 MG tablet TAKE 1 TABLET BY MOUTH THREE TIMES A WEEK, TAKE ANY DAY YOU WEIGH OVER 173 POUNDS OR LESS 90 tablet 3   loratadine (CLARITIN) 10 MG tablet Take 1 tablet (10 mg total) by mouth daily as needed for allergies. 90 tablet 2   Multiple Vitamins-Minerals (CENTRUM SILVER 50+WOMEN  PO) Take 1 tablet by mouth daily.     nystatin-triamcinolone ointment (MYCOLOG) Apply topically 4 (four) times daily.     rosuvastatin (CRESTOR) 20 MG tablet Take 1 tablet (20 mg total) by mouth daily. 90 tablet 3   saccharomyces boulardii (FLORASTOR) 250 MG capsule Take 250 mg by mouth 2 (two) times daily.     triamcinolone cream (KENALOG) 0.1 % APPLY TOPICALLY TO THE AFFECTED AREA TWICE DAILY 30 g 3   TURMERIC CURCUMIN PO Take 1 tablet by mouth at bedtime.     No facility-administered medications prior to visit.    No Known Allergies  ROS Review of Systems  Constitutional:  Negative for activity change and appetite change.  HENT:  Negative for congestion.   Eyes:  Negative for visual disturbance.  Respiratory:  Negative for chest tightness and shortness of breath.   Cardiovascular:  Negative for chest pain, palpitations and leg swelling.  Gastrointestinal:  Negative for abdominal distention and abdominal pain.  Genitourinary:  Negative for difficulty urinating and dysuria.  Musculoskeletal:  Negative for arthralgias and back pain.  Neurological: Negative.   Psychiatric/Behavioral: Negative.       Objective:    Physical Exam Vitals reviewed.  Constitutional:      General: Stephanie Douglas is not in acute distress.    Appearance: Stephanie Douglas is well-developed.  Eyes:     Pupils: Pupils are equal, round, and reactive to light.  Neck:     Vascular: No hepatojugular reflux.  Cardiovascular:     Rate and Rhythm: Normal rate. Rhythm irregular.     Heart sounds: Heart sounds not distant. No murmur heard. No systolic murmur is present.  Pulmonary:     Effort: Pulmonary effort is normal.     Breath sounds: Normal breath sounds.  Musculoskeletal:     Cervical back: Normal range of motion and neck supple.  Neurological:     General: No focal deficit present.     Mental Status: Stephanie Douglas is alert and oriented to person, place, and time.    BP 130/70   Pulse 72   Temp 98.3 F (36.8 C)   Resp 16   Ht  5\' 5"  (1.651 m)   Wt 181 lb (82.1 kg)   SpO2 94%   BMI 30.12 kg/m  Wt Readings from Last 3 Encounters:  06/23/21 181 lb (82.1 kg)  02/15/21 182 lb (82.6 kg)  01/27/21 183 lb (83 kg)     Health Maintenance Due  Topic Date Due   TETANUS/TDAP  Never done   Zoster Vaccines- Shingrix (1 of 2) Never done   DEXA SCAN  Never done  COVID-19 Vaccine (5 - Booster for Pfizer series) 01/27/2021    There are no preventive care reminders to display for this patient.  Lab Results  Component Value Date   TSH 3.540 10/09/2020   Lab Results  Component Value Date   WBC 8.7 06/23/2021   HGB 13.7 06/23/2021   HCT 40.0 06/23/2021   MCV 95 06/23/2021   PLT 169 06/23/2021   Lab Results  Component Value Date   NA 142 06/23/2021   K 4.2 06/23/2021   CO2 23 06/23/2021   GLUCOSE 121 (H) 06/23/2021   BUN 15 06/23/2021   CREATININE 0.94 06/23/2021   BILITOT 0.5 06/23/2021   ALKPHOS 73 06/23/2021   AST 29 06/23/2021   ALT 27 06/23/2021   PROT 6.8 06/23/2021   ALBUMIN 4.2 06/23/2021   CALCIUM 9.0 06/23/2021   ANIONGAP 5 06/03/2021   EGFR 59 (L) 06/23/2021   Lab Results  Component Value Date   CHOL 195 02/15/2021   Lab Results  Component Value Date   HDL 38 (L) 02/15/2021   Lab Results  Component Value Date   LDLCALC 142 (H) 02/15/2021   Lab Results  Component Value Date   TRIG 84 02/15/2021   Lab Results  Component Value Date   CHOLHDL 5.1 (H) 02/15/2021   No results found for: HGBA1C    Assessment & Plan:   Problem List Items Addressed This Visit       Cardiovascular and Mediastinum   Hypertensive heart disease with heart failure ([place]) - Primary   Relevant Orders   CBC with Differential/Platelet (Completed)   Comprehensive metabolic panel (Completed)   Ambulatory referral to Cardiology An individual hypertension care plan was established and reinforced today.  The patient's status was assessed using clinical findings on exam and labs or diagnostic tests. The  patient's success at meeting treatment goals on disease specific evidence-based guidelines and found to be fair controlled. SELF MANAGEMENT: The patient and I together assessed ways to personally work towards obtaining the recommended goals. RECOMMENDATIONS: avoid decongestants found in common cold remedies, decrease consumption of alcohol, perform routine monitoring of BP with home BP cuff, exercise, reduction of dietary salt, take medicines as prescribed, try not to miss doses and quit smoking.  Regular exercise and maintaining a healthy weight is needed.  Stress reduction may help. A CLINICAL SUMMARY including written plan identify barriers to care unique to individual due to social or financial issues.  We attempt to mutually creat solutions for individual and family understanding.      Respiratory   COPD (chronic obstructive pulmonary disease) (HCC) (Chronic)   Relevant Medications   doxycycline (VIBRA-TABS) 100 MG tablet An individualize plan was formulated for care of COPD.  Treatment is evidence based.  Stephanie Douglas will continue on inhalers, avoid smoking and smoke.  Regular exercise with help with dyspnea. Routine follow ups and medication compliance is needed.        Follow-up: Return in about 3 months (around 09/23/2021).    [name], MD

## 2021-06-24 LAB — CBC WITH DIFFERENTIAL/PLATELET
Basophils Absolute: 0.1 10*3/uL (ref 0.0–0.2)
Basos: 1 %
EOS (ABSOLUTE): 0.2 10*3/uL (ref 0.0–0.4)
Eos: 2 %
Hematocrit: 40 % (ref 34.0–46.6)
Hemoglobin: 13.7 g/dL (ref 11.1–15.9)
Immature Grans (Abs): 0 10*3/uL (ref 0.0–0.1)
Immature Granulocytes: 0 %
Lymphocytes Absolute: 1.6 10*3/uL (ref 0.7–3.1)
Lymphs: 18 %
MCH: 32.6 pg (ref 26.6–33.0)
MCHC: 34.3 g/dL (ref 31.5–35.7)
MCV: 95 fL (ref 79–97)
Monocytes Absolute: 0.9 10*3/uL (ref 0.1–0.9)
Monocytes: 11 %
Neutrophils Absolute: 6 10*3/uL (ref 1.4–7.0)
Neutrophils: 68 %
Platelets: 169 10*3/uL (ref 150–450)
RBC: 4.2 x10E6/uL (ref 3.77–5.28)
RDW: 13 % (ref 11.7–15.4)
WBC: 8.7 10*3/uL (ref 3.4–10.8)

## 2021-06-24 LAB — COMPREHENSIVE METABOLIC PANEL
ALT: 27 IU/L (ref 0–32)
AST: 29 IU/L (ref 0–40)
Albumin/Globulin Ratio: 1.6 (ref 1.2–2.2)
Albumin: 4.2 g/dL (ref 3.6–4.6)
Alkaline Phosphatase: 73 IU/L (ref 44–121)
BUN/Creatinine Ratio: 16 (ref 12–28)
BUN: 15 mg/dL (ref 8–27)
Bilirubin Total: 0.5 mg/dL (ref 0.0–1.2)
CO2: 23 mmol/L (ref 20–29)
Calcium: 9 mg/dL (ref 8.7–10.3)
Chloride: 104 mmol/L (ref 96–106)
Creatinine, Ser: 0.94 mg/dL (ref 0.57–1.00)
Globulin, Total: 2.6 g/dL (ref 1.5–4.5)
Glucose: 121 mg/dL — ABNORMAL HIGH (ref 70–99)
Potassium: 4.2 mmol/L (ref 3.5–5.2)
Sodium: 142 mmol/L (ref 134–144)
Total Protein: 6.8 g/dL (ref 6.0–8.5)
eGFR: 59 mL/min/{1.73_m2} — ABNORMAL LOW (ref >59)

## 2021-06-27 NOTE — Progress Notes (Signed)
Cbc normal, glucose 121, eGFR 59, liver tests normal,  lp

## 2021-06-30 DIAGNOSIS — J449 Chronic obstructive pulmonary disease, unspecified: Secondary | ICD-10-CM | POA: Diagnosis not present

## 2021-06-30 DIAGNOSIS — I110 Hypertensive heart disease with heart failure: Secondary | ICD-10-CM | POA: Diagnosis not present

## 2021-06-30 DIAGNOSIS — E782 Mixed hyperlipidemia: Secondary | ICD-10-CM

## 2021-06-30 DIAGNOSIS — I4819 Other persistent atrial fibrillation: Secondary | ICD-10-CM

## 2021-07-07 DIAGNOSIS — Z780 Asymptomatic menopausal state: Secondary | ICD-10-CM | POA: Diagnosis not present

## 2021-07-07 DIAGNOSIS — M810 Age-related osteoporosis without current pathological fracture: Secondary | ICD-10-CM | POA: Diagnosis not present

## 2021-07-07 DIAGNOSIS — M47816 Spondylosis without myelopathy or radiculopathy, lumbar region: Secondary | ICD-10-CM | POA: Diagnosis not present

## 2021-07-07 DIAGNOSIS — Z5321 Procedure and treatment not carried out due to patient leaving prior to being seen by health care provider: Secondary | ICD-10-CM | POA: Diagnosis not present

## 2021-07-07 LAB — HM DEXA SCAN

## 2021-07-13 ENCOUNTER — Encounter: Payer: Self-pay | Admitting: Legal Medicine

## 2021-07-13 ENCOUNTER — Telehealth: Payer: Self-pay

## 2021-07-13 DIAGNOSIS — M810 Age-related osteoporosis without current pathological fracture: Secondary | ICD-10-CM | POA: Insufficient documentation

## 2021-07-13 NOTE — Telephone Encounter (Signed)
Called pt to give results of DEXA. No answer, left VM for return call to clinic.   Results: Osteoporosis, continue alendronate.   Royce Macadamia, [place] 07/13/21 1:49 PM

## 2021-07-21 ENCOUNTER — Encounter: Payer: Self-pay | Admitting: Legal Medicine

## 2021-07-26 ENCOUNTER — Other Ambulatory Visit: Payer: Self-pay | Admitting: Legal Medicine

## 2021-07-26 DIAGNOSIS — I4819 Other persistent atrial fibrillation: Secondary | ICD-10-CM

## 2021-07-26 MED ORDER — FUROSEMIDE 20 MG PO TABS: ORAL_TABLET | ORAL | 3 refills | Status: AC

## 2021-07-27 ENCOUNTER — Telehealth: Payer: Self-pay

## 2021-07-27 NOTE — Chronic Care Management (AMB) (Signed)
° ° °  Chronic Care Management Pharmacy Assistant   Name: Stephanie Douglas  MRN: [id number] DOB: 1934/06/13  Reason for Encounter: Medication Review   07/27/2021- Called patient to clarify prescription Dr Henrene Pastor sent in for Lasix 20 mg. Patient number does not pick up, busy signal. Called son Alex Mcmanigal, he is aware patient is to take 1 tablet three times a week and if her weight is over 173 lbs she should take another lasix on those days she has not. Advised son that patient should be checking her weight daily, first thing in the morning, she should schedule her Lasix to take Monday, Wednesday and Friday or Tuesday, Thursday and Saturday, that way she will know when she took pill and when she would need to take another pill when weight has increased. Son aware and understands instruction. He is also aware medication is out for delivery and will arrive to patients home today or tomorrow.   Medications: Outpatient Encounter Medications as of 07/27/2021  Medication Sig   Acetaminophen (TYLENOL PO) Take 500 mg by mouth at bedtime.    alendronate (FOSAMAX) 70 MG tablet Take 1 tablet (70 mg total) by mouth once a week.   ALPRAZolam (XANAX) 0.25 MG tablet TAKE 1 TABLET(0.25 MG) BY MOUTH TWICE DAILY AS NEEDED FOR ANXIETY   amiodarone (PACERONE) 200 MG tablet Take 1 tablet (200 mg total) by mouth daily.   apixaban (ELIQUIS) 5 MG TABS tablet Take 1 tablet (5 mg total) by mouth 2 (two) times daily.   Budeson-Glycopyrrol-Formoterol 160-9-4.8 MCG/ACT AERO Inhale 2 puffs into the lungs 2 (two) times daily.   calcium carbonate (OSCAL) 1500 (600 Ca) MG TABS tablet Take by mouth 2 (two) times daily with a meal.   carvedilol (COREG) 3.125 MG tablet Take 1 tablet (3.125 mg total) by mouth 2 (two) times daily with a meal.   docusate sodium (COLACE) 250 MG capsule Take 250 mg by mouth daily as needed for constipation.   doxycycline (VIBRA-TABS) 100 MG tablet Take 1 tablet (100 mg total) by mouth 2 (two) times daily.    furosemide (LASIX) 20 MG tablet TAKE 1 TABLET BY MOUTH THREE TIMES A WEEK, T   loratadine (CLARITIN) 10 MG tablet Take 1 tablet (10 mg total) by mouth daily as needed for allergies.   Multiple Vitamins-Minerals (CENTRUM SILVER 50+WOMEN PO) Take 1 tablet by mouth daily.   nystatin-triamcinolone ointment (MYCOLOG) Apply topically 4 (four) times daily.   rosuvastatin (CRESTOR) 20 MG tablet Take 1 tablet (20 mg total) by mouth daily.   saccharomyces boulardii (FLORASTOR) 250 MG capsule Take 250 mg by mouth 2 (two) times daily.   triamcinolone cream (KENALOG) 0.1 % APPLY TOPICALLY TO THE AFFECTED AREA TWICE DAILY   TURMERIC CURCUMIN PO Take 1 tablet by mouth at bedtime.   No facility-administered encounter medications on file as of 07/27/2021.    Pattricia Boss, [place] Pharmacist Assistant [phone]

## 2021-08-19 NOTE — Progress Notes (Addendum)
Subjective:  Patient ID: Stephanie Douglas, female    DOB: Jan 02, 1934  Age: 88 y.o. MRN: [id number]  Chief Complaint  Patient presents with   Atrial Fibrillation   Hyperlipidemia   Hypertension    HPI Afib: Patient is taking Eliquis 5 mg BID, Amiodarone 200 mg daily. Chronic atrial fibrillation  Hyperlipidemia: She takes rosuvastatin 20 mg daily. AN INDIVIDUAL CARE PLAN for hyperlipidemia/ cholesterol was established and reinforced today.  The patient's status was assessed using clinical findings on exam, lab and other diagnostic tests. The patient's disease status was assessed based on evidence-based guidelines and found to be well controlled. MEDICATIONS were reviewed. SELF MANAGEMENT GOALS have been discussed and patient's success at attaining the goal of low cholesterol was assessed. RECOMMENDATION given include regular exercise 3 days a week and low cholesterol/low fat diet. CLINICAL SUMMARY including written plan to identify barriers unique to the patient due to social or economic  reasons was discussed.   Hypertension: Carvedilol 3.125 BID, furosemide 20 mg daily. An individual hypertension care plan was established and reinforced today.  The patient's status was assessed using clinical findings on exam and labs or diagnostic tests. The patient's success at meeting treatment goals on disease specific evidence-based guidelines and found to be well controlled. SELF MANAGEMENT: The patient and I together assessed ways to personally work towards obtaining the recommended goals. RECOMMENDATIONS: avoid decongestants found in common cold remedies, decrease consumption of alcohol, perform routine monitoring of BP with home BP cuff, exercise, reduction of dietary salt, take medicines as prescribed, try not to miss doses and quit smoking.  Regular exercise and maintaining a healthy weight is needed.  Stress reduction may help. A CLINICAL SUMMARY including written plan identify barriers to  care unique to individual due to social or financial issues.  We attempt to mutually creat solutions for individual and family understanding.   Current Outpatient Medications on File Prior to Visit  Medication Sig Dispense Refill   Acetaminophen (TYLENOL PO) Take 500 mg by mouth at bedtime.      alendronate (FOSAMAX) 70 MG tablet Take 1 tablet (70 mg total) by mouth once a week. 12 tablet 1   ALPRAZolam (XANAX) 0.25 MG tablet TAKE 1 TABLET(0.25 MG) BY MOUTH TWICE DAILY AS NEEDED FOR ANXIETY 60 tablet 3   amiodarone (PACERONE) 200 MG tablet Take 1 tablet (200 mg total) by mouth daily. 90 tablet 2   apixaban (ELIQUIS) 5 MG TABS tablet Take 1 tablet (5 mg total) by mouth 2 (two) times daily. 180 tablet 2   Budeson-Glycopyrrol-Formoterol 160-9-4.8 MCG/ACT AERO Inhale 2 puffs into the lungs 2 (two) times daily. 10.7 g 2   calcium carbonate (OSCAL) 1500 (600 Ca) MG TABS tablet Take by mouth 2 (two) times daily with a meal.     carvedilol (COREG) 3.125 MG tablet Take 1 tablet (3.125 mg total) by mouth 2 (two) times daily with a meal. 180 tablet 3   furosemide (LASIX) 20 MG tablet TAKE 1 TABLET BY MOUTH THREE TIMES A WEEK, T 90 tablet 3   loratadine (CLARITIN) 10 MG tablet Take 1 tablet (10 mg total) by mouth daily as needed for allergies. 90 tablet 2   Multiple Vitamins-Minerals (CENTRUM SILVER 50+WOMEN PO) Take 1 tablet by mouth daily.     nystatin-triamcinolone ointment (MYCOLOG) Apply topically 4 (four) times daily.     rosuvastatin (CRESTOR) 20 MG tablet Take 1 tablet (20 mg total) by mouth daily. 90 tablet 3   saccharomyces boulardii (FLORASTOR) 250 MG  capsule Take 250 mg by mouth 2 (two) times daily.     triamcinolone cream (KENALOG) 0.1 % APPLY TOPICALLY TO THE AFFECTED AREA TWICE DAILY 30 g 3   TURMERIC CURCUMIN PO Take 1 tablet by mouth at bedtime.     No current facility-administered medications on file prior to visit.   Past Medical History:  Diagnosis Date   COPD (chronic obstructive  pulmonary disease) (HCC)    Depression    HTN (hypertension)    Hyperlipidemia    PONV (postoperative nausea and vomiting)    PONV (postoperative nausea and vomiting)    Past Surgical History:  Procedure Laterality Date   ABDOMINAL HYSTERECTOMY     CARDIOVERSION N/A 06/13/2017   Procedure: CARDIOVERSION;  Surgeon: Lelon Perla, MD;  Location: [place];  Service: Cardiovascular;  Laterality: N/A;   CARDIOVERSION N/A 07/07/2017   Procedure: CARDIOVERSION;  Surgeon: Sanda Klein, MD;  Location: [place];  Service: Cardiovascular;  Laterality: N/A;   TEE WITHOUT CARDIOVERSION N/A 06/13/2017   Procedure: TRANSESOPHAGEAL ECHOCARDIOGRAM (TEE);  Surgeon: Lelon Perla, MD;  Location: [facility] ENDOSCOPY;  Service: Cardiovascular;  Laterality: N/A;    Family History  Problem Relation Age of Onset   CAD Mother    Cardiomyopathy Mother    Diabetes Mother    Asthma Father    Breast cancer Sister    Diabetes Brother    Hypertension Brother    AAA (abdominal aortic aneurysm) Sister    Social History   Socioeconomic History   Marital status: Widowed    Spouse name: Not on file   Number of children: Not on file   Years of education: Not on file   Highest education level: Not on file  Occupational History   Not on file  Tobacco Use   Smoking status: Never   Smokeless tobacco: Never  Vaping Use   Vaping Use: Never used  Substance and Sexual Activity   Alcohol use: No   Drug use: No   Sexual activity: Not Currently  Other Topics Concern   Not on file  Social History Narrative   Not on file   Social Determinants of Health   Financial Resource Strain: Not on file  Food Insecurity: Not on file  Transportation Needs: Not on file  Physical Activity: Not on file  Stress: Not on file  Social Connections: Not on file    Review of Systems  Constitutional:  Negative for chills, fatigue and fever.  HENT:  Negative for congestion, ear pain and sore throat.   Respiratory:   Positive for shortness of breath. Negative for cough.   Cardiovascular:  Negative for chest pain and palpitations.  Gastrointestinal:  Positive for constipation. Negative for abdominal pain, diarrhea, nausea and vomiting.  Endocrine: Negative for polydipsia, polyphagia and polyuria.  Genitourinary:  Negative for difficulty urinating and dysuria.  Musculoskeletal:  Negative for arthralgias, back pain and myalgias.  Skin:  Negative for rash.  Neurological:  Positive for headaches.  Psychiatric/Behavioral:  Negative for dysphoric mood. The patient is not nervous/anxious.     Objective:  BP 140/88    Pulse 60    Temp 97.9 F (36.6 C)    Resp 15    Ht 5\' 5"  (1.651 m)    Wt 181 lb (82.1 kg)    SpO2 92%    BMI 30.12 kg/m   BP/Weight 08/20/2021 06/23/2021 96/09/2295  Systolic BP 989 211 941  Diastolic BP 88 70 67  Wt. (Lbs) 181 181 -  BMI  30.12 30.12 -    Physical Exam Constitutional:      Appearance: Normal appearance. She is obese.  HENT:     Head: Normocephalic.     Right Ear: Tympanic membrane, ear canal and external ear normal.     Left Ear: Tympanic membrane, ear canal and external ear normal.     Nose: Nose normal.     Mouth/Throat:     Mouth: Mucous membranes are moist.     Pharynx: Oropharynx is clear. No posterior oropharyngeal erythema.  Eyes:     Extraocular Movements: Extraocular movements intact.     Conjunctiva/sclera: Conjunctivae normal.     Pupils: Pupils are equal, round, and reactive to light.  Neck:     Vascular: No carotid bruit.  Cardiovascular:     Rate and Rhythm: Normal rate. Rhythm irregular.     Pulses: Normal pulses.     Heart sounds: No murmur heard. Pulmonary:     Effort: Pulmonary effort is normal.     Breath sounds: Normal breath sounds.  Abdominal:     General: Bowel sounds are normal.     Palpations: Abdomen is soft. There is no mass.  Musculoskeletal:        General: Normal range of motion.     Cervical back: Normal range of motion. No  tenderness.     Right lower leg: No edema.     Left lower leg: No edema.  Neurological:     Gait: Gait normal.     Deep Tendon Reflexes: Reflexes normal.  Psychiatric:        Mood and Affect: Mood normal.        Behavior: Behavior normal.        Thought Content: Thought content normal.        Lab Results  Component Value Date   WBC 7.5 08/20/2021   HGB 14.1 08/20/2021   HCT 41.3 08/20/2021   PLT 159 08/20/2021   GLUCOSE 106 (H) 08/20/2021   CHOL 135 08/20/2021   TRIG 70 08/20/2021   HDL 37 (L) 08/20/2021   LDLCALC 84 08/20/2021   ALT 23 08/20/2021   AST 27 08/20/2021   NA 143 08/20/2021   K 4.1 08/20/2021   CL 104 08/20/2021   CREATININE 1.00 08/20/2021   BUN 16 08/20/2021   CO2 24 08/20/2021   TSH 3.540 10/09/2020   INR 1.5 (H) 06/03/2021      Assessment & Plan:   Problem List Items Addressed This Visit       Cardiovascular and Mediastinum   Chronic combined systolic and diastolic heart failure ([place]) - Primary    The current medical regimen is effective;  continue present plan and medications. An individualized care plan was established and reinforced.  The patient's disease status was assessed using clinical finding son exam today, labs, and/or other diagnostic testing such as x-rays, to determine the patient's success in meeting treatmentgoalsbased on disease-based guidelines and found to be improving. But not at goal yet. Medications prescriptions no changes Laboratory tests ordered to be performed today include routine. RECOMMENDATIONS: given include follow up with cardiology.  Call physician is patient gains 3 lbs in one day or 5 lbs for one week.  Call for progressive PND, orthopnea or increased pedal edema.       Hypertensive heart disease with heart failure ([place])    Fair controlled. No changes to medicines.  Continue to work on eating a healthy diet and exercise.  Labs drawn today.  Relevant Orders   Comprehensive metabolic panel (Completed)    CBC with Differential/Platelet (Completed)   Tricuspid regurgitation Tr is stable    Persistent atrial fibrillation (HCC)    The current medical regimen is effective;  continue present plan and medications.      Senile purpura (HCC)     Respiratory   COPD (chronic obstructive pulmonary disease) (HCC) (Chronic) An individualize plan was formulated for care of COPD.  Treatment is evidence based.  She will continue on inhalers, avoid smoking and smoke.  Regular exercise with help with dyspnea. Routine follow ups and medication compliance is needed.      Musculoskeletal and Integument   Osteoporosis Patient has osteoporosis on fosamax     Other   Mixed hyperlipidemia    Well controlled.  No changes to medicines.  Continue to work on eating a healthy diet and exercise.  Labs drawn today.       Relevant Orders   Lipid panel (Completed)   GAD (generalized anxiety disorder) Patient stable with her anxiety   Other Visit Diagnoses     Need for vaccination       Chronic constipation       Relevant Medications   docusate sodium (COLACE) 250 MG capsule Continue medicine for constipation  Other noncytopenic purpura Patient bruises easily on forearms     .  Meds ordered this encounter  Medications   docusate sodium (COLACE) 250 MG capsule    Sig: Take 1 capsule (250 mg total) by mouth daily as needed for constipation.    Dispense:  30 capsule    Refill:  1   Zoster Vaccine Adjuvanted [facility]) injection    Sig: Inject 0.5 mLs into the muscle once for 1 dose.    Dispense:  0.5 mL    Refill:  0    Orders Placed This Encounter  Procedures   Comprehensive metabolic panel   Lipid panel   CBC with Differential/Platelet   Cardiovascular Risk Assessment   30 minute visit with review of records from cardiologist  I,Daurice Ovando,acting as a scribe for [name], MD.,have documented all relevant documentation on the behalf of [name], MD,as directed by   [name], MD while in the presence of [name], MD.   Follow-up: Return in about 6 months (around 02/17/2022) for fasting.  An After Visit Summary was printed and given to the patient.  [name], MD [facility] [phone]

## 2021-08-20 ENCOUNTER — Ambulatory Visit (INDEPENDENT_AMBULATORY_CARE_PROVIDER_SITE_OTHER): Payer: PPO | Admitting: Legal Medicine

## 2021-08-20 ENCOUNTER — Other Ambulatory Visit: Payer: Self-pay

## 2021-08-20 ENCOUNTER — Encounter: Payer: Self-pay | Admitting: Legal Medicine

## 2021-08-20 VITALS — BP 140/88 | HR 60 | Temp 97.9°F | Resp 15 | Ht 65.0 in | Wt 181.0 lb

## 2021-08-20 DIAGNOSIS — M810 Age-related osteoporosis without current pathological fracture: Secondary | ICD-10-CM

## 2021-08-20 DIAGNOSIS — K5909 Other constipation: Secondary | ICD-10-CM

## 2021-08-20 DIAGNOSIS — E782 Mixed hyperlipidemia: Secondary | ICD-10-CM | POA: Diagnosis not present

## 2021-08-20 DIAGNOSIS — D692 Other nonthrombocytopenic purpura: Secondary | ICD-10-CM

## 2021-08-20 DIAGNOSIS — J449 Chronic obstructive pulmonary disease, unspecified: Secondary | ICD-10-CM

## 2021-08-20 DIAGNOSIS — Z23 Encounter for immunization: Secondary | ICD-10-CM

## 2021-08-20 DIAGNOSIS — I5042 Chronic combined systolic (congestive) and diastolic (congestive) heart failure: Secondary | ICD-10-CM

## 2021-08-20 DIAGNOSIS — F411 Generalized anxiety disorder: Secondary | ICD-10-CM

## 2021-08-20 DIAGNOSIS — I361 Nonrheumatic tricuspid (valve) insufficiency: Secondary | ICD-10-CM

## 2021-08-20 DIAGNOSIS — I4819 Other persistent atrial fibrillation: Secondary | ICD-10-CM | POA: Diagnosis not present

## 2021-08-20 DIAGNOSIS — I110 Hypertensive heart disease with heart failure: Secondary | ICD-10-CM

## 2021-08-20 MED ORDER — SHINGRIX 50 MCG/0.5ML IM SUSR: 0.5000 mL | Freq: Once | INTRAMUSCULAR | 0 refills | Status: AC

## 2021-08-20 MED ORDER — DOCUSATE SODIUM 250 MG PO CAPS: 250.0000 mg | ORAL_CAPSULE | Freq: Every day | ORAL | 1 refills | Status: AC | PRN

## 2021-08-20 NOTE — Assessment & Plan Note (Signed)
The current medical regimen is effective;  continue present plan and medications.  

## 2021-08-20 NOTE — Assessment & Plan Note (Signed)
Well controlled.  ?No changes to medicines.  ?Continue to work on eating a healthy diet and exercise.  ?Labs drawn today.  ?

## 2021-08-20 NOTE — Assessment & Plan Note (Signed)
Fair controlled. No changes to medicines.  Continue to work on eating a healthy diet and exercise.  Labs drawn today.

## 2021-08-21 LAB — CBC WITH DIFFERENTIAL/PLATELET
Basophils Absolute: 0.1 10*3/uL (ref 0.0–0.2)
Basos: 1 %
EOS (ABSOLUTE): 0.1 10*3/uL (ref 0.0–0.4)
Eos: 2 %
Hematocrit: 41.3 % (ref 34.0–46.6)
Hemoglobin: 14.1 g/dL (ref 11.1–15.9)
Immature Grans (Abs): 0 10*3/uL (ref 0.0–0.1)
Immature Granulocytes: 0 %
Lymphocytes Absolute: 1.5 10*3/uL (ref 0.7–3.1)
Lymphs: 20 %
MCH: 32.9 pg (ref 26.6–33.0)
MCHC: 34.1 g/dL (ref 31.5–35.7)
MCV: 97 fL (ref 79–97)
Monocytes Absolute: 0.8 10*3/uL (ref 0.1–0.9)
Monocytes: 11 %
Neutrophils Absolute: 4.9 10*3/uL (ref 1.4–7.0)
Neutrophils: 66 %
Platelets: 159 10*3/uL (ref 150–450)
RBC: 4.28 x10E6/uL (ref 3.77–5.28)
RDW: 13.2 % (ref 11.7–15.4)
WBC: 7.5 10*3/uL (ref 3.4–10.8)

## 2021-08-21 LAB — COMPREHENSIVE METABOLIC PANEL
ALT: 23 IU/L (ref 0–32)
AST: 27 IU/L (ref 0–40)
Albumin/Globulin Ratio: 1.4 (ref 1.2–2.2)
Albumin: 4.2 g/dL (ref 3.6–4.6)
Alkaline Phosphatase: 70 IU/L (ref 44–121)
BUN/Creatinine Ratio: 16 (ref 12–28)
BUN: 16 mg/dL (ref 8–27)
Bilirubin Total: 0.5 mg/dL (ref 0.0–1.2)
CO2: 24 mmol/L (ref 20–29)
Calcium: 9.2 mg/dL (ref 8.7–10.3)
Chloride: 104 mmol/L (ref 96–106)
Creatinine, Ser: 1 mg/dL (ref 0.57–1.00)
Globulin, Total: 3.1 g/dL (ref 1.5–4.5)
Glucose: 106 mg/dL — ABNORMAL HIGH (ref 70–99)
Potassium: 4.1 mmol/L (ref 3.5–5.2)
Sodium: 143 mmol/L (ref 134–144)
Total Protein: 7.3 g/dL (ref 6.0–8.5)
eGFR: 55 mL/min/{1.73_m2} — ABNORMAL LOW (ref >59)

## 2021-08-21 LAB — LIPID PANEL
Chol/HDL Ratio: 3.6 ratio (ref 0.0–4.4)
Cholesterol, Total: 135 mg/dL (ref 100–199)
HDL: 37 mg/dL — ABNORMAL LOW (ref >39)
LDL Chol Calc (NIH): 84 mg/dL (ref 0–99)
Triglycerides: 70 mg/dL (ref 0–149)
VLDL Cholesterol Cal: 14 mg/dL (ref 5–40)

## 2021-08-21 LAB — CARDIOVASCULAR RISK ASSESSMENT

## 2021-08-22 NOTE — Progress Notes (Addendum)
Glucose 106, kidney stage 3a,  liver tests normal, cholesterol normal, CBC normal lp

## 2021-08-25 ENCOUNTER — Telehealth: Payer: Self-pay

## 2021-08-25 NOTE — Progress Notes (Signed)
Chronic Care Management Pharmacy Assistant   Name: Stephanie Douglas  MRN: [id number] DOB: February 26, 1934   Reason for Encounter: Medication Coordination for Upstream    Recent office visits:  08/20/21 Stephanie Meeker MD. Seen for Atrial Fibrillation. Ordered Zoster Vac Recomb 29mcg/0.5ml once. D/C Doxycycline Hyclate 100 mg 2 times daily.  06/23/21 Stephanie Meeker MD. Seen for Follow up Chest Pain. Started on Doxycycline Hyclate 100 mg 2 times daily.Referral to Cardiology.  Recent consult visits:  None  Hospital visits:  None  Medications: Outpatient Encounter Medications as of 08/25/2021  Medication Sig   Acetaminophen (TYLENOL PO) Take 500 mg by mouth at bedtime.    alendronate (FOSAMAX) 70 MG tablet Take 1 tablet (70 mg total) by mouth once a week.   ALPRAZolam (XANAX) 0.25 MG tablet TAKE 1 TABLET(0.25 MG) BY MOUTH TWICE DAILY AS NEEDED FOR ANXIETY   amiodarone (PACERONE) 200 MG tablet Take 1 tablet (200 mg total) by mouth daily.   apixaban (ELIQUIS) 5 MG TABS tablet Take 1 tablet (5 mg total) by mouth 2 (two) times daily.   Budeson-Glycopyrrol-Formoterol 160-9-4.8 MCG/ACT AERO Inhale 2 puffs into the lungs 2 (two) times daily.   calcium carbonate (OSCAL) 1500 (600 Ca) MG TABS tablet Take by mouth 2 (two) times daily with a meal.   carvedilol (COREG) 3.125 MG tablet Take 1 tablet (3.125 mg total) by mouth 2 (two) times daily with a meal.   docusate sodium (COLACE) 250 MG capsule Take 1 capsule (250 mg total) by mouth daily as needed for constipation.   furosemide (LASIX) 20 MG tablet TAKE 1 TABLET BY MOUTH THREE TIMES A WEEK, T   loratadine (CLARITIN) 10 MG tablet Take 1 tablet (10 mg total) by mouth daily as needed for allergies.   Multiple Vitamins-Minerals (CENTRUM SILVER 50+WOMEN PO) Take 1 tablet by mouth daily.   nystatin-triamcinolone ointment (MYCOLOG) Apply topically 4 (four) times daily.   rosuvastatin (CRESTOR) 20 MG tablet Take 1 tablet (20 mg total) by mouth daily.    saccharomyces boulardii (FLORASTOR) 250 MG capsule Take 250 mg by mouth 2 (two) times daily.   triamcinolone cream (KENALOG) 0.1 % APPLY TOPICALLY TO THE AFFECTED AREA TWICE DAILY   TURMERIC CURCUMIN PO Take 1 tablet by mouth at bedtime.   No facility-administered encounter medications on file as of 08/25/2021.   Reviewed chart for medication changes ahead of medication coordination call.  No Consults, or hospital visits since last care coordination call/Pharmacist visit.   No medication changes indicated OR if recent visit, treatment plan here.  BP Readings from Last 3 Encounters:  08/20/21 140/88  06/23/21 130/70  06/03/21 134/67    No results found for: HGBA1C   Patient obtains medications through Vials  90 Days   Patient is due for her first adherence delivery on: 09/06/21. Called patient and reviewed medications and coordinated delivery.  This delivery to include: Alendronate 70mg  1 tab once a week Amiodarone 200mg  1 tab once daily Carvedilol 3.125mg  1 tab twice daily Rosuvastatin 20mg  1 tab once daily Alprazolam 0.25mg  1 tab twice daily prn (30ds) Breztri Inhaler 131mcg/act 2 puffs twice daily (30ds) Furosemide 20mg  1 tab three times a week  Loratadine 10mg  1 tab once daily prn  Eliquis 5mg  1 tab twice daily  Nystatin-Triamcinolone Ointment 15gm- Apply topically 4 times daily  Patient declined the following medications  None   Patient needs refills None  Confirmed delivery date of 09/06/21, advised patient that pharmacy will contact them the morning of delivery.  Stephanie Douglas, [place] Pharmacist Assistant  [phone]

## 2021-08-30 DIAGNOSIS — F419 Anxiety disorder, unspecified: Secondary | ICD-10-CM | POA: Diagnosis not present

## 2021-08-30 DIAGNOSIS — E785 Hyperlipidemia, unspecified: Secondary | ICD-10-CM | POA: Diagnosis not present

## 2021-08-30 DIAGNOSIS — Z7901 Long term (current) use of anticoagulants: Secondary | ICD-10-CM | POA: Diagnosis not present

## 2021-08-30 DIAGNOSIS — I509 Heart failure, unspecified: Secondary | ICD-10-CM | POA: Diagnosis not present

## 2021-08-30 DIAGNOSIS — Z6830 Body mass index (BMI) 30.0-30.9, adult: Secondary | ICD-10-CM | POA: Diagnosis not present

## 2021-08-30 DIAGNOSIS — D6869 Other thrombophilia: Secondary | ICD-10-CM | POA: Diagnosis not present

## 2021-08-30 DIAGNOSIS — I110 Hypertensive heart disease with heart failure: Secondary | ICD-10-CM | POA: Diagnosis not present

## 2021-08-30 DIAGNOSIS — I480 Paroxysmal atrial fibrillation: Secondary | ICD-10-CM | POA: Diagnosis not present

## 2021-08-30 DIAGNOSIS — J449 Chronic obstructive pulmonary disease, unspecified: Secondary | ICD-10-CM | POA: Diagnosis not present

## 2021-08-31 NOTE — Telephone Encounter (Signed)
Compliant on meds 

## 2021-09-03 DIAGNOSIS — I10 Essential (primary) hypertension: Secondary | ICD-10-CM | POA: Insufficient documentation

## 2021-09-07 ENCOUNTER — Other Ambulatory Visit: Payer: Self-pay

## 2021-09-07 ENCOUNTER — Encounter: Payer: Self-pay | Admitting: Cardiology

## 2021-09-07 ENCOUNTER — Ambulatory Visit: Payer: PPO | Admitting: Cardiology

## 2021-09-07 VITALS — BP 140/80 | HR 63 | Ht 65.0 in | Wt 182.0 lb

## 2021-09-07 DIAGNOSIS — Z79899 Other long term (current) drug therapy: Secondary | ICD-10-CM | POA: Diagnosis not present

## 2021-09-07 DIAGNOSIS — I480 Paroxysmal atrial fibrillation: Secondary | ICD-10-CM

## 2021-09-07 DIAGNOSIS — Z7901 Long term (current) use of anticoagulants: Secondary | ICD-10-CM

## 2021-09-07 DIAGNOSIS — I110 Hypertensive heart disease with heart failure: Secondary | ICD-10-CM

## 2021-09-07 DIAGNOSIS — R079 Chest pain, unspecified: Secondary | ICD-10-CM | POA: Diagnosis not present

## 2021-09-07 NOTE — Progress Notes (Signed)
Cardiology Office Note:    Date:  09/07/2021   ID:  Stephanie Douglas, DOB [birth year], MRN [id number]  PCP:  Lillard Anes, MD  Cardiologist:  Shirlee More, MD   Referring MD: [name] Edward,*with amiodarone therapy please do a TSH every 6 months and recheck lab work.  ASSESSMENT:    1. Chest pain of uncertain etiology   2. Paroxysmal atrial fibrillation (HCC)   3. On amiodarone therapy   4. Chronic anticoagulation   5. Hypertensive heart disease with heart failure (HCC)    PLAN:    In order of problems listed above:  Retrospect I think she had a brief episode of atrial fibrillation that converted spontaneously prior to ED if she has another subjective extra tablet of her beta-blocker and rest.  Continue amiodarone and her anticoagulant. Stable continue amiodarone I will ask her PCP to be sure to check a TSH every 6 months with lab work Continue her anticoagulant moderate stroke risk Stable blood pressure is at target no fluid overload continue her loop diuretic and beta-blocker  Next appointment 9 months   Medication Adjustments/Labs and Tests Ordered: Current medicines are reviewed at length with the patient today.  Concerns regarding medicines are outlined above.  No orders of the defined types were placed in this encounter.  No orders of the defined types were placed in this encounter.   Chief complaint: Follow-up for atrial fibrillation as well as her ED visit.  History of Present Illness:    Stephanie Douglas is a 87 y.o. female who is being seen today for the evaluation of chest pain.  She was last seen by me 10/09/2020 with a history of paroxysmal atrial fibrillation anticoagulated maintaining sinus rhythm with amiodarone hypertension with heart failure and hyperlipidemia.  Previously had a myocardial perfusion study at [facility]  06/10/2017 showing no ischemia.  She was seen at [facility] ED 06/04/2019 to the emergency room note says that her  problems included chest pain nausea and weakness.  Her high-sensitivity troponin initial and repeat were normal chest x-ray showed no acute findings CBC showed a hemoglobin of 12.6 BNP level was low at 145 magnesium normal 2.0 and CMP showed a creatinine of 0.93 potassium of 4.1 while in the emergency room she had an ultrasound of the abdomen reviewed and normal except for cyst right kidney. calls again next the emergency room relates that an EKG showed junctional rhythm with left anterior hemiblock she was judged to not require in-hospital care and is discharged from the hospital.  I independently reviewed that EKG there is baseline artifact with sinus bradycardia at a rate of 51 bpm with a narrow QRS.  The day of her ED visit she noticed her heart racing she felt very badly she had some chest tightness with and the symptoms have resolved by the time she got to the emergency room.  In retrospect I think she had a bout of atrial fibrillation and it was a very stressful day there is been no recurrence.  She is in sinus rhythm today and I told her if there is another episode to take an extra dose of her beta-blocker and to rest. She is having no edema shortness of breath orthopnea chest pain palpitation or syncope She tolerates her anticoagulant without bleeding complication  Past Medical History:  Diagnosis Date   COPD (chronic obstructive pulmonary disease) (HCC)    Depression    HTN (hypertension)    Hyperlipidemia    PONV (postoperative nausea and  vomiting)    PONV (postoperative nausea and vomiting)     Past Surgical History:  Procedure Laterality Date   ABDOMINAL HYSTERECTOMY     CARDIOVERSION N/A 06/13/2017   Procedure: CARDIOVERSION;  Surgeon: Lelon Perla, MD;  Location: [facility] ENDOSCOPY;  Service: Cardiovascular;  Laterality: N/A;   CARDIOVERSION N/A 07/07/2017   Procedure: CARDIOVERSION;  Surgeon: Sanda Klein, MD;  Location: [facility] ENDOSCOPY;  Service: Cardiovascular;  Laterality: N/A;    TEE WITHOUT CARDIOVERSION N/A 06/13/2017   Procedure: TRANSESOPHAGEAL ECHOCARDIOGRAM (TEE);  Surgeon: Lelon Perla, MD;  Location: [facility] ENDOSCOPY;  Service: Cardiovascular;  Laterality: N/A;    Current Medications: Current Meds  Medication Sig   Acetaminophen (TYLENOL PO) Take 500 mg by mouth at bedtime.    alendronate (FOSAMAX) 70 MG tablet Take 1 tablet (70 mg total) by mouth once a week.   ALPRAZolam (XANAX) 0.25 MG tablet TAKE 1 TABLET(0.25 MG) BY MOUTH TWICE DAILY AS NEEDED FOR ANXIETY   amiodarone (PACERONE) 200 MG tablet Take 1 tablet (200 mg total) by mouth daily.   apixaban (ELIQUIS) 5 MG TABS tablet Take 1 tablet (5 mg total) by mouth 2 (two) times daily.   Budeson-Glycopyrrol-Formoterol 160-9-4.8 MCG/ACT AERO Inhale 2 puffs into the lungs 2 (two) times daily.   calcium carbonate (OSCAL) 1500 (600 Ca) MG TABS tablet Take by mouth 2 (two) times daily with a meal.   carvedilol (COREG) 3.125 MG tablet Take 1 tablet (3.125 mg total) by mouth 2 (two) times daily with a meal.   docusate sodium (COLACE) 250 MG capsule Take 1 capsule (250 mg total) by mouth daily as needed for constipation.   furosemide (LASIX) 20 MG tablet TAKE 1 TABLET BY MOUTH THREE TIMES A WEEK, T   loratadine (CLARITIN) 10 MG tablet Take 1 tablet (10 mg total) by mouth daily as needed for allergies.   Multiple Vitamins-Minerals (CENTRUM SILVER 50+WOMEN PO) Take 1 tablet by mouth daily.   nystatin-triamcinolone ointment (MYCOLOG) Apply topically 4 (four) times daily.   rosuvastatin (CRESTOR) 20 MG tablet Take 1 tablet (20 mg total) by mouth daily.   saccharomyces boulardii (FLORASTOR) 250 MG capsule Take 250 mg by mouth 2 (two) times daily.   triamcinolone cream (KENALOG) 0.1 % APPLY TOPICALLY TO THE AFFECTED AREA TWICE DAILY   TURMERIC CURCUMIN PO Take 1 tablet by mouth at bedtime.     Allergies:   Patient has no known allergies.   Social History   Socioeconomic History   Marital status: Widowed    Spouse  name: Not on file   Number of children: Not on file   Years of education: Not on file   Highest education level: Not on file  Occupational History   Not on file  Tobacco Use   Smoking status: Never    Passive exposure: Never   Smokeless tobacco: Never  Vaping Use   Vaping Use: Never used  Substance and Sexual Activity   Alcohol use: No   Drug use: No   Sexual activity: Not Currently  Other Topics Concern   Not on file  Social History Narrative   Not on file   Social Determinants of Health   Financial Resource Strain: Not on file  Food Insecurity: Not on file  Transportation Needs: Not on file  Physical Activity: Not on file  Stress: Not on file  Social Connections: Not on file     Family History: The patient's family history includes AAA (abdominal aortic aneurysm) in her sister; Asthma in her  father; Breast cancer in her sister; CAD in her mother; Cardiomyopathy in her mother; Diabetes in her brother and mother; Hypertension in her brother.  ROS:   ROS Please see the history of present illness.     All other systems reviewed and are negative.  EKGs/Labs/Other Studies Reviewed:    The following studies were reviewed today:   EKG:  EKG is  ordered today.  The ekg ordered today is personally reviewed and demonstrates sinus rhythm first-degree AV block poor R wave progression left axis deviation minor nonspecific ST abnormality QT interval normal  Recent Labs: 10/09/2020: TSH 3.540 06/03/2021: B Natriuretic Peptide 145.2; Magnesium 1.9 08/20/2021: ALT 23; BUN 16; Creatinine, Ser 1.00; Hemoglobin 14.1; Platelets 159; Potassium 4.1; Sodium 143  Recent Lipid Panel    Component Value Date/Time   CHOL 135 08/20/2021 0923   TRIG 70 08/20/2021 0923   HDL 37 (L) 08/20/2021 0923   CHOLHDL 3.6 08/20/2021 0923   LDLCALC 84 08/20/2021 0923    Physical Exam:    VS:  BP 140/80 (BP Location: Right Arm)    Pulse 63    Ht 5\' 5"  (1.651 m)    Wt 182 lb (82.6 kg)    SpO2 97%    BMI  30.29 kg/m     Wt Readings from Last 3 Encounters:  09/07/21 182 lb (82.6 kg)  08/20/21 181 lb (82.1 kg)  06/23/21 181 lb (82.1 kg)     GEN:  Well nourished, well developed in no acute distress HEENT: Normal NECK: No JVD; No carotid bruits LYMPHATICS: No lymphadenopathy CARDIAC: RRR, no murmurs, rubs, gallops RESPIRATORY:  Clear to auscultation without rales, wheezing or rhonchi  ABDOMEN: Soft, non-tender, non-distended MUSCULOSKELETAL:  No edema; No deformity  SKIN: Warm and dry NEUROLOGIC:  Alert and oriented x 3 PSYCHIATRIC:  Normal affect     Signed, Shirlee More, MD  09/07/2021 2:02 PM    [place]

## 2021-09-07 NOTE — Patient Instructions (Addendum)
Medication Instructions:  Your physician recommends that you continue on your current medications as directed. Please refer to the Current Medication list given to you today.  For rapid heart rate take an extra tablet of your carvedilol and rest on the couch.   *If you need a refill on your cardiac medications before your next appointment, please call your pharmacy*   Lab Work: None If you have labs (blood work) drawn today and your tests are completely normal, you will receive your results only by: [place] (if you have MyChart) OR A paper copy in the mail If you have any lab test that is abnormal or we need to change your treatment, we will call you to review the results.   Testing/Procedures: None   Follow-Up: At [facility], you and your health needs are our priority.  As part of our continuing mission to provide you with exceptional heart care, we have created designated Provider Care Teams.  These Care Teams include your primary Cardiologist (physician) and Advanced Practice Providers (APPs -  Physician Assistants and Nurse Practitioners) who all work together to provide you with the care you need, when you need it.  We recommend signing up for the patient portal called "MyChart".  Sign up information is provided on this After Visit Summary.  MyChart is used to connect with patients for Virtual Visits (Telemedicine).  Patients are able to view lab/test results, encounter notes, upcoming appointments, etc.  Non-urgent messages can be sent to your provider as well.   To learn more about what you can do with MyChart, go to [email or web address].    Your next appointment:   9 month(s)  The format for your next appointment:   In Person  Provider:   Shirlee More, MD    Other Instructions

## 2021-10-03 ENCOUNTER — Other Ambulatory Visit: Payer: Self-pay | Admitting: Legal Medicine

## 2021-10-03 ENCOUNTER — Other Ambulatory Visit: Payer: Self-pay | Admitting: Cardiology

## 2021-10-03 DIAGNOSIS — Z79899 Other long term (current) drug therapy: Secondary | ICD-10-CM

## 2021-10-20 ENCOUNTER — Telehealth: Payer: PPO

## 2021-11-11 ENCOUNTER — Other Ambulatory Visit: Payer: Self-pay | Admitting: Legal Medicine

## 2021-11-17 IMAGING — MG MM DIGITAL SCREENING BILAT W/ TOMO AND CAD
8 series · 8 of 24 positions shown · non-contrast
Comparison: Previous exam(s).

ACR Breast Density Category a: The breast tissue is almost entirely
fatty.

CLINICAL DATA: Screening.

EXAM:
DIGITAL SCREENING BILATERAL MAMMOGRAM WITH TOMOSYNTHESIS AND CAD
TECHNIQUE: Bilateral screening digital craniocaudal and mediolateral oblique
mammograms were obtained. Bilateral screening digital breast
tomosynthesis was performed. The images were evaluated with
computer-aided detection.

[L MLO synth-2D]
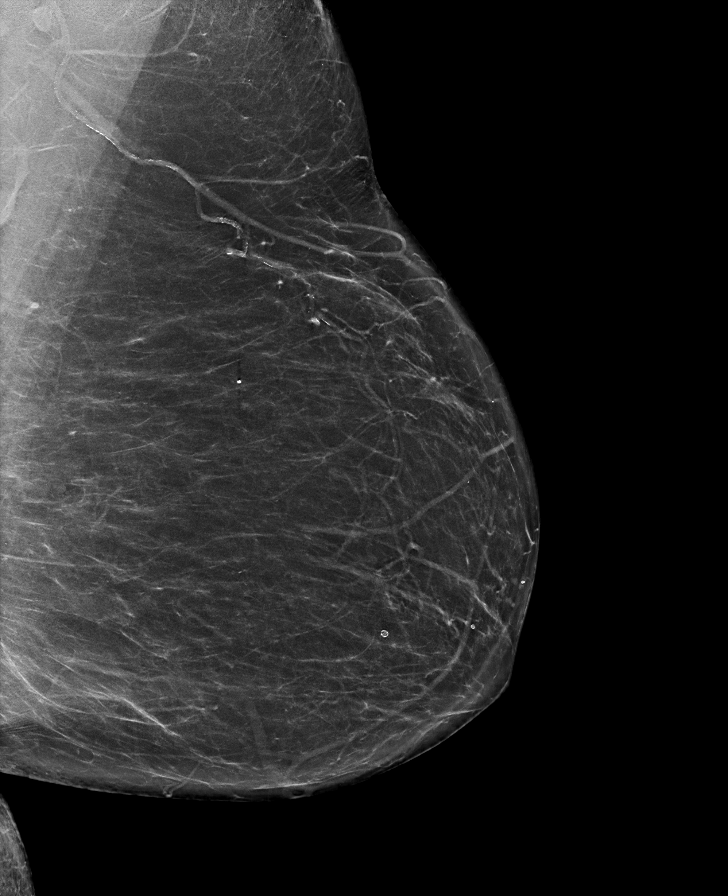

[L CC synth-2D]
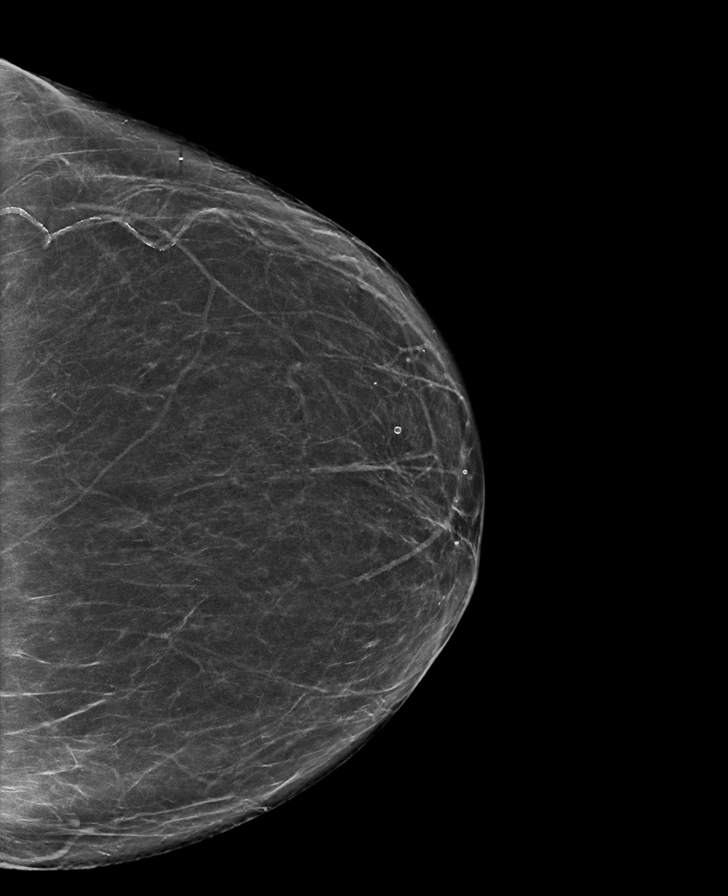

[R MLO synth-2D]
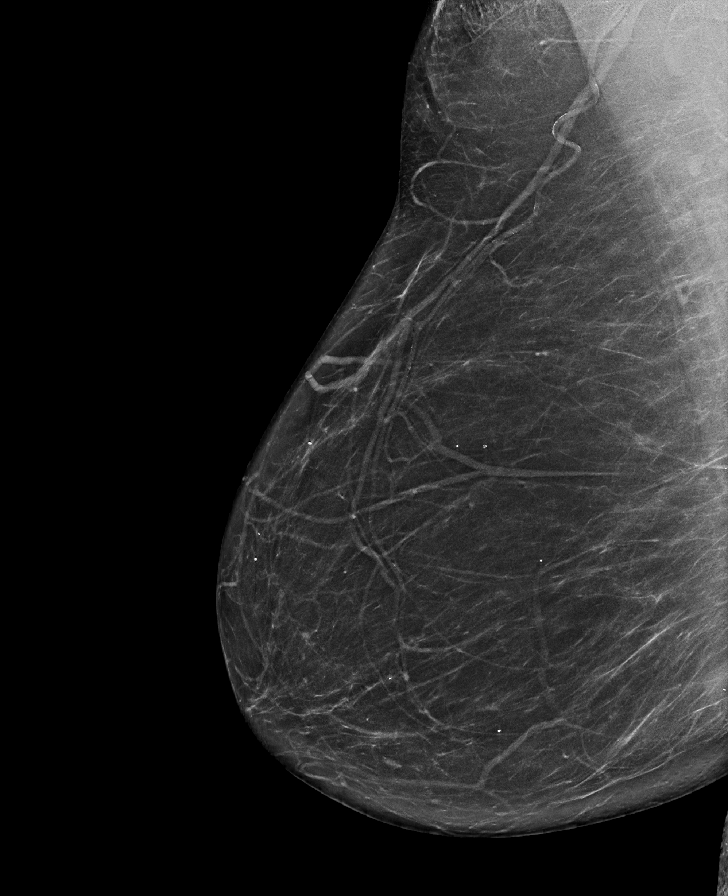

[R CC synth-2D]
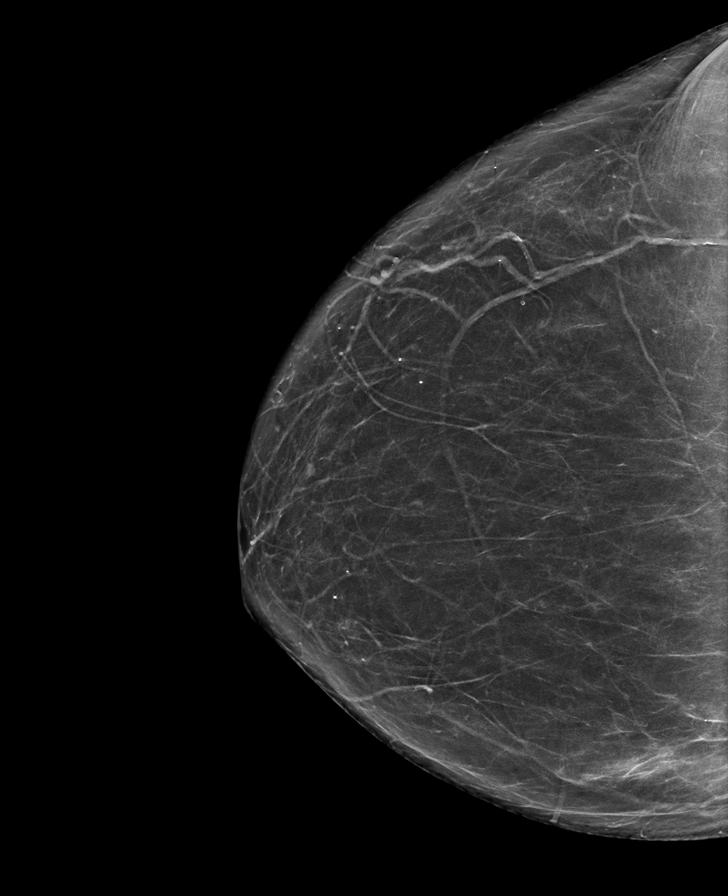

[L CC tomo · tomo slice 35/69.0]
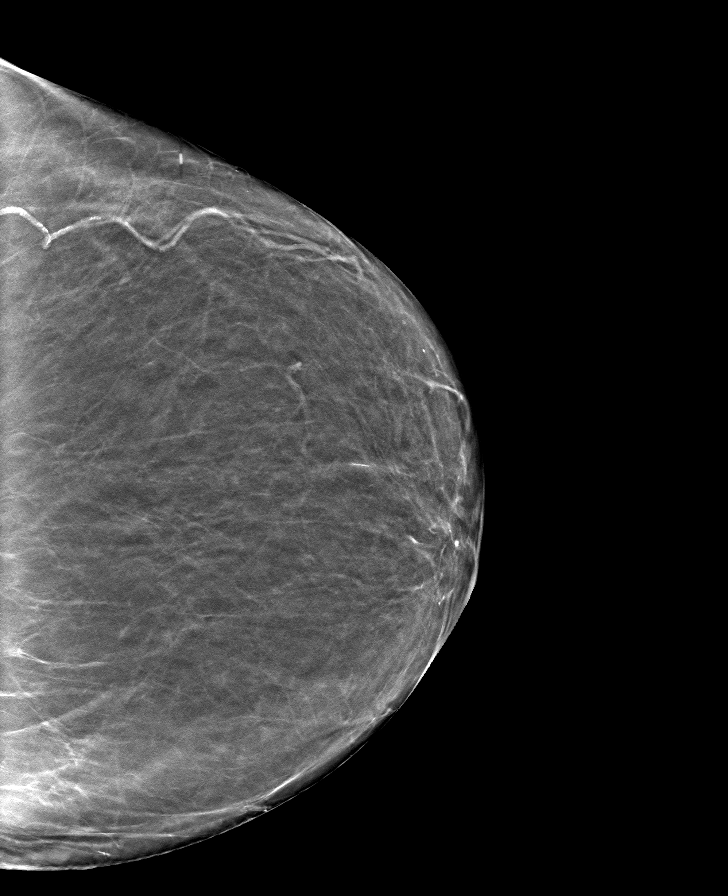

[L MLO tomo · tomo slice 39/77.0]
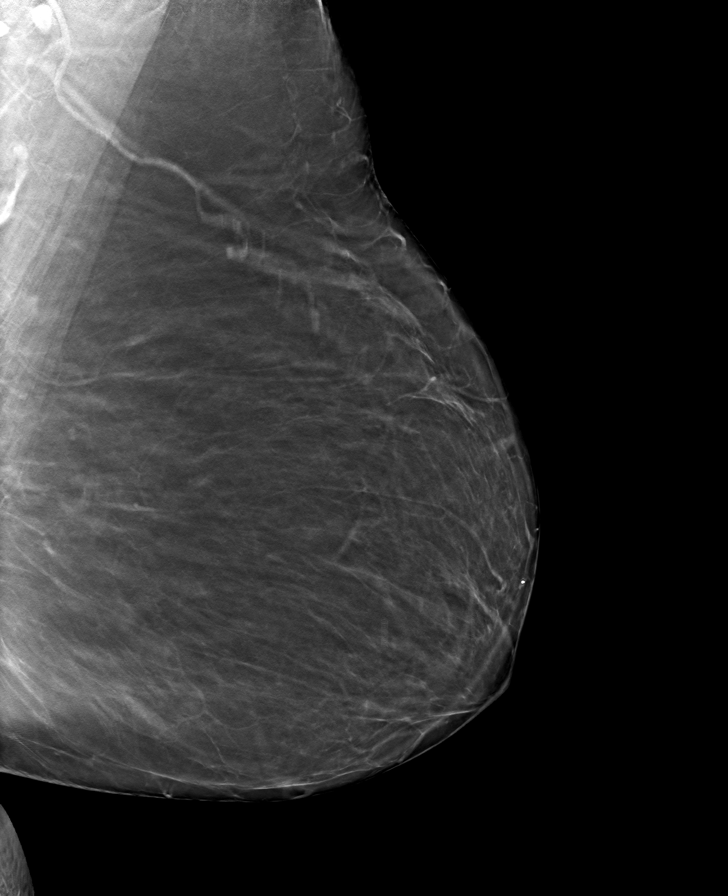

[R CC tomo · tomo slice 35/69.0]
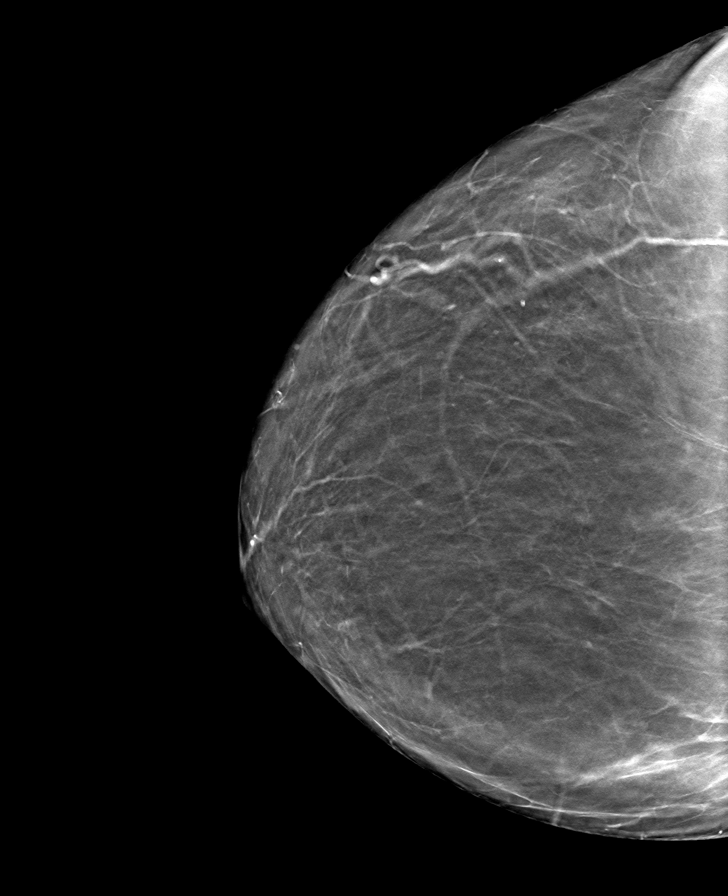

[R MLO tomo · tomo slice 37/74.0]
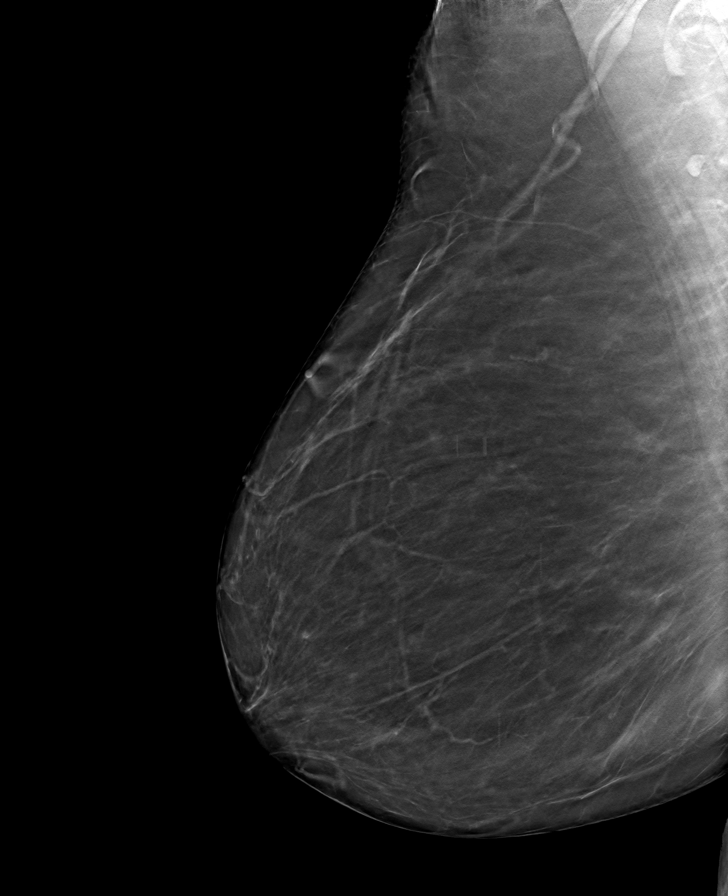

[8 of 24 positions shown; findings below may reference images not displayed]

FINDINGS: There are no findings suspicious for malignancy. Though the patient
was called back for additional diagnostic imaging on her prior
screening mammogram, the mass previously seen has since resolved.
IMPRESSION: No mammographic evidence of malignancy. A result letter of this
screening mammogram will be mailed directly to the patient.

RECOMMENDATION:
Screening mammogram in one year. (Code:56-L-Q37)

BI-RADS CATEGORY  1: Negative.

## 2021-11-23 ENCOUNTER — Telehealth: Payer: Self-pay

## 2021-11-23 NOTE — Progress Notes (Signed)
? ? ?Chronic Care Management ?Pharmacy Assistant  ? ?Name: Stephanie Douglas  MRN: [id number] DOB: [birth year] ? ? ?Reason for Encounter: Medication Coordination for Upstream  ?  ?Recent office visits:  ?None ? ?Recent consult visits:  ?09/07/21 (Cardiology) Shirlee More MD. Seen for Chest Pain. No med changes. ? ?Hospital visits:  ?None ? ?Medications: ?Outpatient Encounter Medications as of 11/23/2021  ?Medication Sig  ? Acetaminophen (TYLENOL PO) Take 500 mg by mouth at bedtime.   ? alendronate (FOSAMAX) 70 MG tablet Take 1 tablet (70 mg total) by mouth once a week.  ? ALPRAZolam (XANAX) 0.25 MG tablet TAKE 1 TABLET(0.25 MG) BY MOUTH TWICE DAILY AS NEEDED FOR ANXIETY  ? amiodarone (PACERONE) 200 MG tablet Take 1 tablet (200 mg total) by mouth daily.  ? apixaban (ELIQUIS) 5 MG TABS tablet Take 1 tablet (5 mg total) by mouth 2 (two) times daily.  ? BREZTRI AEROSPHERE 160-9-4.8 MCG/ACT AERO INHALE TWO PUFFS BY MOUTH INTO LUNGS TWICE DAILY  ? calcium carbonate (OSCAL) 1500 (600 Ca) MG TABS tablet Take by mouth 2 (two) times daily with a meal.  ? carvedilol (COREG) 3.125 MG tablet Take 1 tablet (3.125 mg total) by mouth 2 (two) times daily with a meal.  ? docusate sodium (COLACE) 250 MG capsule Take 1 capsule (250 mg total) by mouth daily as needed for constipation.  ? furosemide (LASIX) 20 MG tablet TAKE 1 TABLET BY MOUTH THREE TIMES A WEEK, T  ? loratadine (CLARITIN) 10 MG tablet Take 1 tablet (10 mg total) by mouth daily as needed for allergies.  ? Multiple Vitamins-Minerals (CENTRUM SILVER 50+WOMEN PO) Take 1 tablet by mouth daily.  ? nystatin-triamcinolone ointment (MYCOLOG) Apply topically 4 (four) times daily.  ? rosuvastatin (CRESTOR) 20 MG tablet Take 1 tablet (20 mg total) by mouth daily.  ? saccharomyces boulardii (FLORASTOR) 250 MG capsule Take 250 mg by mouth 2 (two) times daily.  ? triamcinolone cream (KENALOG) 0.1 % APPLY TOPICALLY TO THE AFFECTED AREA TWICE DAILY  ? TURMERIC CURCUMIN PO Take 1 tablet by  mouth at bedtime.  ? ?No facility-administered encounter medications on file as of 11/23/2021.  ? ? ?Reviewed chart for medication changes ahead of medication coordination call. ? ?No OVs, or hospital visits since last care coordination call/Pharmacist visit.  ? ?No medication changes indicated OR if recent visit, treatment plan here. ? ?BP Readings from Last 3 Encounters:  ?09/07/21 140/80  ?08/20/21 140/88  ?06/23/21 130/70  ?  ?No results found for: HGBA1C  ? ?Patient obtains medications through Vials  90 Days  ? ?Last adherence delivery included:  ?Alendronate '70mg'$  1 tab once a week ?Amiodarone '200mg'$  1 tab once daily ?Carvedilol 3.'125mg'$  1 tab twice daily ?Rosuvastatin '20mg'$  1 tab once daily ?Alprazolam 0.'25mg'$  1 tab twice daily prn (30ds) ?Breztri Inhaler 187mg/act 2 puffs twice daily (30ds) ?Furosemide '20mg'$  1 tab three times a week  ?Loratadine '10mg'$  1 tab once daily prn  ?Eliquis '5mg'$  1 tab twice daily  ?Nystatin-Triamcinolone Ointment 15gm- Apply topically 4 times daily ? ?Patient declined (meds) last delivery ?None ? ?Patient is due for next adherence delivery on: 12/03/21. ?Called patient and reviewed medications and coordinated delivery. ? ?This delivery to include: ?Alendronate '70mg'$  1 tab once a week ?Amiodarone '200mg'$  1 tab once daily ?Carvedilol 3.'125mg'$  1 tab twice daily ?Rosuvastatin '20mg'$  1 tab once daily ?Alprazolam 0.'25mg'$  1 tab twice daily prn (30ds) ?Breztri Inhaler 1640m/act 2 puffs twice daily (30ds) ?Furosemide '20mg'$  1 tab three times a week  ?  Loratadine '10mg'$  1 tab once daily prn  ?Eliquis '5mg'$  1 tab twice daily  ?Nystatin-Triamcinolone Ointment 15gm- Apply topically 4 times daily ? ?Unable to reach pt after several attempts. Pharmacy will coordinate delivery with pt ? ?Elray Mcgregor, CMA ?Clinical Pharmacist Assistant  ?[phone]  ?

## 2021-11-24 DIAGNOSIS — H2703 Aphakia, bilateral: Secondary | ICD-10-CM | POA: Diagnosis not present

## 2021-11-25 ENCOUNTER — Other Ambulatory Visit: Payer: Self-pay

## 2021-11-25 DIAGNOSIS — M810 Age-related osteoporosis without current pathological fracture: Secondary | ICD-10-CM

## 2021-11-25 MED ORDER — ALENDRONATE SODIUM 70 MG PO TABS: 70.0000 mg | ORAL_TABLET | ORAL | 1 refills | Status: DC

## 2021-11-25 NOTE — Telephone Encounter (Signed)
Tried calling patient, unable to get in touch ? ?Spoke with son who confirmed birthday and will pass on delivery date of meds ?

## 2022-01-06 ENCOUNTER — Other Ambulatory Visit: Payer: Self-pay | Admitting: Legal Medicine

## 2022-01-06 DIAGNOSIS — D692 Other nonthrombocytopenic purpura: Secondary | ICD-10-CM

## 2022-01-10 ENCOUNTER — Telehealth: Payer: PPO

## 2022-01-22 IMAGING — DX DG CHEST 1V PORT
1 series · 2 of 2 positions shown · non-contrast
Comparison: 07/23/2017

CLINICAL DATA: Chest pain.

EXAM:
PORTABLE CHEST 1 VIEW

[Series 1: chest · 0.14mm/px · 2 of 2 slices shown]
[im 1/2]
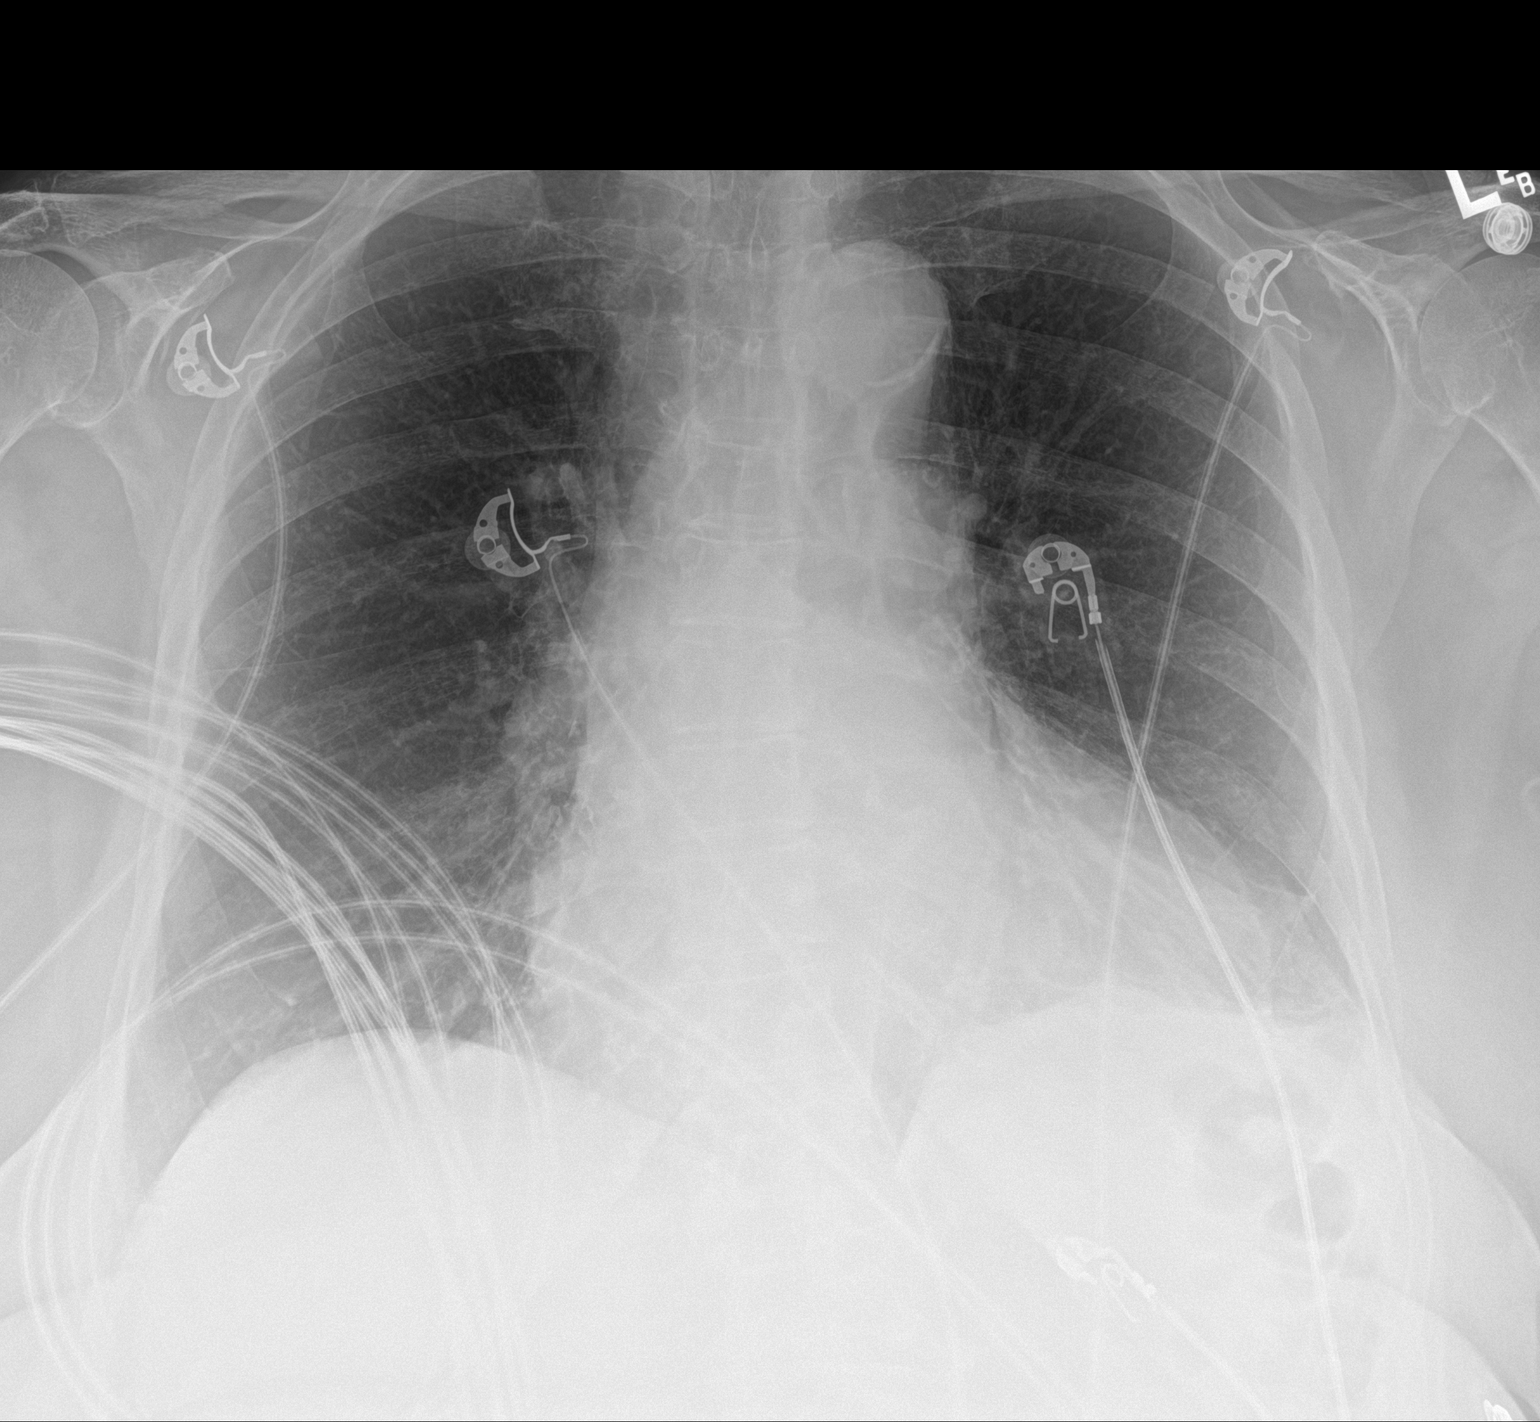
[im 2/2]
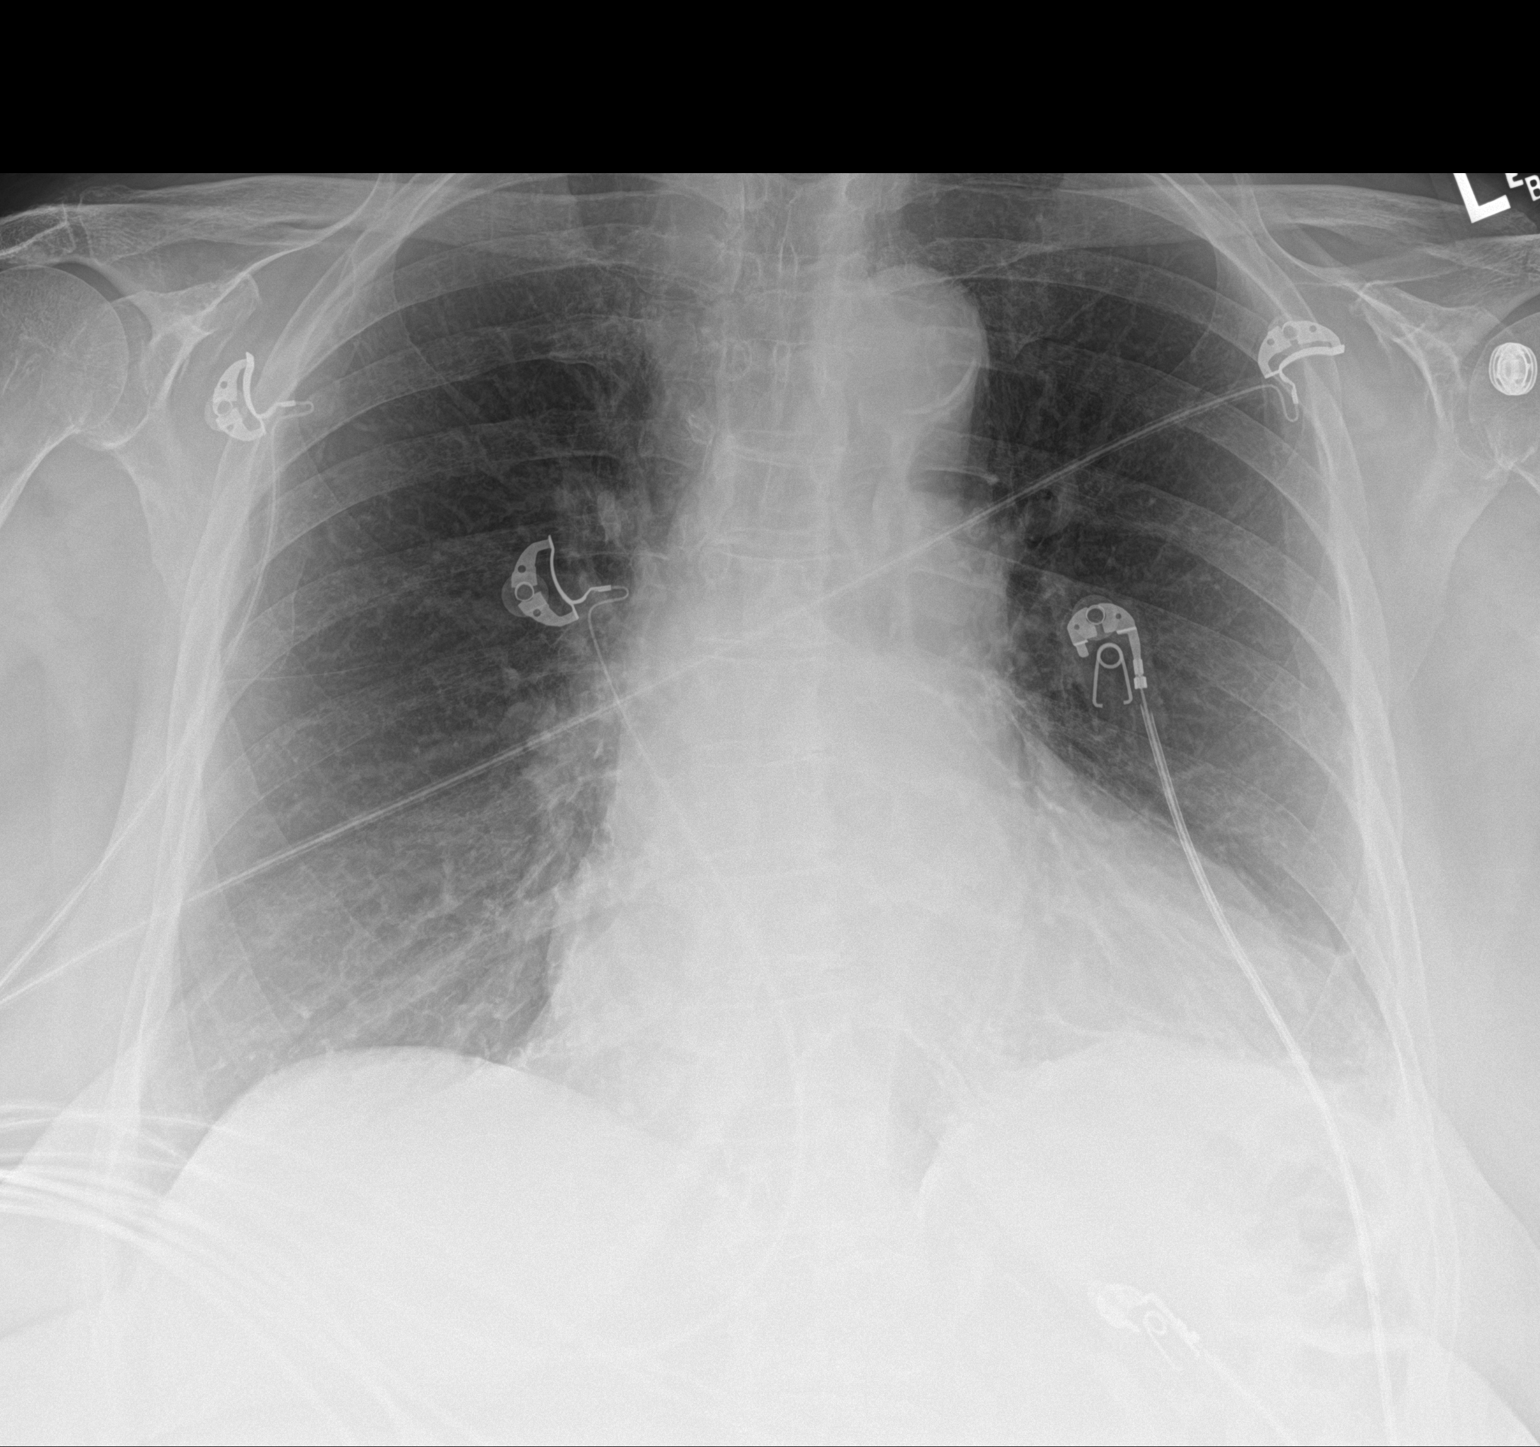

[2 of 2 positions shown; findings below may reference images not displayed]

FINDINGS: The heart is enlarged but stable. Stable tortuosity and
calcification of the thoracic aorta. The pulmonary hila appear
normal.

No acute pulmonary findings. No pleural effusions. Streaky left
basilar scarring changes. The bony thorax is intact.
IMPRESSION: 1. Stable cardiac enlargement.
2. No acute pulmonary findings.

## 2022-01-25 ENCOUNTER — Ambulatory Visit (INDEPENDENT_AMBULATORY_CARE_PROVIDER_SITE_OTHER): Payer: PPO

## 2022-01-25 DIAGNOSIS — I5042 Chronic combined systolic (congestive) and diastolic (congestive) heart failure: Secondary | ICD-10-CM

## 2022-01-25 DIAGNOSIS — I4819 Other persistent atrial fibrillation: Secondary | ICD-10-CM

## 2022-01-25 DIAGNOSIS — I110 Hypertensive heart disease with heart failure: Secondary | ICD-10-CM

## 2022-01-28 DIAGNOSIS — I4891 Unspecified atrial fibrillation: Secondary | ICD-10-CM

## 2022-01-28 DIAGNOSIS — M8580 Other specified disorders of bone density and structure, unspecified site: Secondary | ICD-10-CM | POA: Diagnosis not present

## 2022-01-28 DIAGNOSIS — E785 Hyperlipidemia, unspecified: Secondary | ICD-10-CM

## 2022-01-28 DIAGNOSIS — J449 Chronic obstructive pulmonary disease, unspecified: Secondary | ICD-10-CM

## 2022-01-28 DIAGNOSIS — M810 Age-related osteoporosis without current pathological fracture: Secondary | ICD-10-CM | POA: Diagnosis not present

## 2022-01-28 DIAGNOSIS — I509 Heart failure, unspecified: Secondary | ICD-10-CM

## 2022-01-28 DIAGNOSIS — I110 Hypertensive heart disease with heart failure: Secondary | ICD-10-CM | POA: Diagnosis not present

## 2022-02-03 DIAGNOSIS — L559 Sunburn, unspecified: Secondary | ICD-10-CM | POA: Diagnosis not present

## 2022-02-15 NOTE — Progress Notes (Signed)
Subjective:  Patient ID: Stephanie Douglas, female    DOB: 1934-07-26  Age: 88 y.o. MRN: [id number]  Chief Complaint  Patient presents with  . Hyperlipidemia  . Hypertension  . Atrial Fibrillation    HPI  Stephanie Douglas presents for follow-up.   Lipid/Cholesterol, Follow-up  Last lipid panel Other pertinent labs  Lab Results  Component Value Date   CHOL 135 08/20/2021   HDL 37 (L) 08/20/2021   LDLCALC 84 08/20/2021   TRIG 70 08/20/2021   CHOLHDL 3.6 08/20/2021   Lab Results  Component Value Date   ALT 23 08/20/2021   AST 27 08/20/2021   PLT 159 08/20/2021   TSH 3.540 10/09/2020     She was last seen for this 6 months ago.  Management includes crestor.  She reports excellent compliance with treatment. She is not having side effects.   Current diet: in general, a "healthy" diet    Hypertension She was last seen for hypertension 6 months ago.  BP at that visit was 140/88. Management includes coreg, lasix.  She reports excellent compliance with treatment. She is not having side effects.  She is following a Regular diet. She is exercising. She does not smoke.   Pertinent labs: Lab Results  Component Value Date   CHOL 135 08/20/2021   HDL 37 (L) 08/20/2021   LDLCALC 84 08/20/2021   TRIG 70 08/20/2021   CHOLHDL 3.6 08/20/2021   Lab Results  Component Value Date   NA 143 08/20/2021   K 4.1 08/20/2021   CREATININE 1.00 08/20/2021   EGFR 55 (L) 08/20/2021   GFRNONAA >60 06/03/2021   GLUCOSE 106 (H) 08/20/2021       A-fib: Taking pacerone, eliquis last seen Cardiology 09/07/2021  Current Outpatient Medications on File Prior to Visit  Medication Sig Dispense Refill  . Acetaminophen (TYLENOL PO) Take 500 mg by mouth at bedtime.     Stephanie Douglas alendronate (FOSAMAX) 70 MG tablet Take 1 tablet (70 mg total) by mouth once a week. 12 tablet 1  . ALPRAZolam (XANAX) 0.25 MG tablet TAKE 1 TABLET(0.25 MG) BY MOUTH TWICE DAILY AS NEEDED FOR ANXIETY 60 tablet 3  . amiodarone  (PACERONE) 200 MG tablet Take 1 tablet (200 mg total) by mouth daily. 90 tablet 2  . apixaban (ELIQUIS) 5 MG TABS tablet Take 1 tablet (5 mg total) by mouth 2 (two) times daily. 180 tablet 2  . BREZTRI AEROSPHERE 160-9-4.8 MCG/ACT AERO INHALE TWO PUFFS BY MOUTH INTO LUNGS TWICE DAILY 10.7 g 2  . calcium carbonate (OSCAL) 1500 (600 Ca) MG TABS tablet Take by mouth 2 (two) times daily with a meal.    . carvedilol (COREG) 3.125 MG tablet Take 1 tablet (3.125 mg total) by mouth 2 (two) times daily with a meal. 180 tablet 3  . docusate sodium (COLACE) 250 MG capsule Take 1 capsule (250 mg total) by mouth daily as needed for constipation. 30 capsule 1  . furosemide (LASIX) 20 MG tablet TAKE 1 TABLET BY MOUTH THREE TIMES A WEEK, T 90 tablet 3  . loratadine (CLARITIN) 10 MG tablet Take 1 tablet (10 mg total) by mouth daily as needed for allergies. 90 tablet 2  . Multiple Vitamins-Minerals (CENTRUM SILVER 50+WOMEN PO) Take 1 tablet by mouth daily.    Stephanie Douglas nystatin-triamcinolone ointment (MYCOLOG) Apply topically 4 (four) times daily.    . rosuvastatin (CRESTOR) 20 MG tablet Take 1 tablet (20 mg total) by mouth daily. 90 tablet 3  . saccharomyces boulardii (FLORASTOR)  250 MG capsule Take 250 mg by mouth 2 (two) times daily.    Stephanie Douglas triamcinolone cream (KENALOG) 0.1 % apply TOPICALLY TO THE affected AREA twice daily 30 g 0  . TURMERIC CURCUMIN PO Take 1 tablet by mouth at bedtime.     No current facility-administered medications on file prior to visit.   Past Medical History:  Diagnosis Date  . COPD (chronic obstructive pulmonary disease) ([place])   . Depression   . HTN (hypertension)   . Hyperlipidemia   . PONV (postoperative nausea and vomiting)   . PONV (postoperative nausea and vomiting)    Past Surgical History:  Procedure Laterality Date  . ABDOMINAL HYSTERECTOMY    . CARDIOVERSION N/A 06/13/2017   Procedure: CARDIOVERSION;  Surgeon: Lelon Perla, MD;  Location: [facility] ENDOSCOPY;  Service:  Cardiovascular;  Laterality: N/A;  . CARDIOVERSION N/A 07/07/2017   Procedure: CARDIOVERSION;  Surgeon: Sanda Klein, MD;  Location: [facility] ENDOSCOPY;  Service: Cardiovascular;  Laterality: N/A;  . TEE WITHOUT CARDIOVERSION N/A 06/13/2017   Procedure: TRANSESOPHAGEAL ECHOCARDIOGRAM (TEE);  Surgeon: Lelon Perla, MD;  Location: [facility] ENDOSCOPY;  Service: Cardiovascular;  Laterality: N/A;    Family History  Problem Relation Age of Onset  . CAD Mother   . Cardiomyopathy Mother   . Diabetes Mother   . Asthma Father   . Breast cancer Sister   . Diabetes Brother   . Hypertension Brother   . AAA (abdominal aortic aneurysm) Sister    Social History   Socioeconomic History  . Marital status: Widowed    Spouse name: Not on file  . Number of children: Not on file  . Years of education: Not on file  . Highest education level: Not on file  Occupational History  . Not on file  Tobacco Use  . Smoking status: Never    Passive exposure: Never  . Smokeless tobacco: Never  Vaping Use  . Vaping Use: Never used  Substance and Sexual Activity  . Alcohol use: No  . Drug use: No  . Sexual activity: Not Currently  Other Topics Concern  . Not on file  Social History Narrative  . Not on file   Social Determinants of Health   Financial Resource Strain: Low Risk  (06/01/2020)   Overall Financial Resource Strain (CARDIA)   . Difficulty of Paying Living Expenses: Not hard at all  Food Insecurity: No Food Insecurity (06/01/2020)   Hunger Vital Sign   . Worried About Charity fundraiser in the Last Year: Never true   . Ran Out of Food in the Last Year: Never true  Transportation Needs: No Transportation Needs (01/25/2022)   PRAPARE - Transportation   . Lack of Transportation (Medical): No   . Lack of Transportation (Non-Medical): No  Physical Activity: Insufficiently Active (01/25/2022)   Exercise Vital Sign   . Days of Exercise per Week: 2 days   . Minutes of Exercise per Session: 30 min   Stress: Stress Concern Present (06/01/2020)   [place]   . Feeling of Stress : To some extent  Social Connections: Moderately Integrated (06/01/2020)   Social Connection and Isolation Panel [NHANES]   . Frequency of Communication with Friends and Family: More than three times a week   . Frequency of Social Gatherings with Friends and Family: More than three times a week   . Attends Religious Services: More than 4 times per year   . Active Member of Clubs or  Organizations: Yes   . Attends Archivist Meetings: 1 to 4 times per year   . Marital Status: Widowed    Review of Systems  Constitutional:  Negative for chills, fatigue and fever.  HENT:  Negative for congestion, ear pain, rhinorrhea and sore throat.   Respiratory:  Negative for cough and shortness of breath.   Cardiovascular:  Negative for chest pain.  Gastrointestinal:  Negative for abdominal pain, constipation, diarrhea, nausea and vomiting.  Genitourinary:  Negative for dysuria and urgency.  Musculoskeletal:  Negative for back pain and myalgias.  Neurological:  Negative for dizziness, weakness, light-headedness and headaches.  Psychiatric/Behavioral:  Negative for dysphoric mood. The patient is not nervous/anxious.      Objective:  There were no vitals taken for this visit.     09/07/2021    1:59 PM 08/20/2021    9:33 AM 08/20/2021    8:59 AM  BP/Weight  Systolic BP 295 621 308  Diastolic BP 80 88 90  Wt. (Lbs) 182  181  BMI 30.29 kg/m2  30.12 kg/m2    Physical Exam Vitals reviewed.  Constitutional:      Appearance: Normal appearance. She is normal weight.  Neck:     Vascular: No carotid bruit.  Cardiovascular:     Rate and Rhythm: Normal rate and regular rhythm.     Heart sounds: Normal heart sounds.  Pulmonary:     Effort: Pulmonary effort is normal. No respiratory distress.     Breath sounds: Normal breath sounds.  Abdominal:      General: Abdomen is flat. Bowel sounds are normal.     Palpations: Abdomen is soft.     Tenderness: There is no abdominal tenderness.  Neurological:     Mental Status: She is alert and oriented to person, place, and time.  Psychiatric:        Mood and Affect: Mood normal.        Behavior: Behavior normal.    Diabetic Foot Exam - Simple   No data filed      Lab Results  Component Value Date   WBC 7.5 08/20/2021   HGB 14.1 08/20/2021   HCT 41.3 08/20/2021   PLT 159 08/20/2021   GLUCOSE 106 (H) 08/20/2021   CHOL 135 08/20/2021   TRIG 70 08/20/2021   HDL 37 (L) 08/20/2021   LDLCALC 84 08/20/2021   ALT 23 08/20/2021   AST 27 08/20/2021   NA 143 08/20/2021   K 4.1 08/20/2021   CL 104 08/20/2021   CREATININE 1.00 08/20/2021   BUN 16 08/20/2021   CO2 24 08/20/2021   TSH 3.540 10/09/2020   INR 1.5 (H) 06/03/2021      Assessment & Plan:  .    Continue medications We will call you with lab results and mammogram for August 30th, 2023 (Wednesday) Mederma scar cream topically to bilateral legs Follow-up in 29-months  Follow-up:   An After Visit Summary was printed and given to the patient.  Rip Harbour, NP [place] [phone]

## 2022-02-16 ENCOUNTER — Ambulatory Visit: Payer: PPO | Admitting: Legal Medicine

## 2022-02-18 ENCOUNTER — Other Ambulatory Visit: Payer: Self-pay | Admitting: Nurse Practitioner

## 2022-02-18 ENCOUNTER — Ambulatory Visit (INDEPENDENT_AMBULATORY_CARE_PROVIDER_SITE_OTHER): Payer: PPO | Admitting: Nurse Practitioner

## 2022-02-18 ENCOUNTER — Encounter: Payer: Self-pay | Admitting: Nurse Practitioner

## 2022-02-18 VITALS — BP 118/78 | HR 58 | Temp 97.0°F | Ht 65.0 in | Wt 181.0 lb

## 2022-02-18 DIAGNOSIS — Z7901 Long term (current) use of anticoagulants: Secondary | ICD-10-CM | POA: Diagnosis not present

## 2022-02-18 DIAGNOSIS — I5042 Chronic combined systolic (congestive) and diastolic (congestive) heart failure: Secondary | ICD-10-CM | POA: Diagnosis not present

## 2022-02-18 DIAGNOSIS — E782 Mixed hyperlipidemia: Secondary | ICD-10-CM | POA: Diagnosis not present

## 2022-02-18 DIAGNOSIS — Z1231 Encounter for screening mammogram for malignant neoplasm of breast: Secondary | ICD-10-CM | POA: Diagnosis not present

## 2022-02-18 DIAGNOSIS — I4819 Other persistent atrial fibrillation: Secondary | ICD-10-CM | POA: Diagnosis not present

## 2022-02-18 DIAGNOSIS — Z6830 Body mass index (BMI) 30.0-30.9, adult: Secondary | ICD-10-CM | POA: Diagnosis not present

## 2022-02-18 DIAGNOSIS — D692 Other nonthrombocytopenic purpura: Secondary | ICD-10-CM

## 2022-02-18 MED ORDER — TRIAMCINOLONE ACETONIDE 0.1 % EX CREA: TOPICAL_CREAM | CUTANEOUS | 0 refills | Status: DC

## 2022-02-18 NOTE — Patient Instructions (Addendum)
Continue medications We will call you with lab results and mammogram for August 30th, 2023 (Wednesday) Mederma scar cream topically to bilateral legs Follow-up in 58-months  [place] [facility] Your bones do more than support your body. They also store calcium. The inside of your bones (marrow) makes blood cells. Maintaining bone health becomes more important as you age because your bones replace all their cells about every 10 years. Around age 5, it gets harder to replace those cells, and bones can become weak. Weak bones can lead to osteoporosis and breaks (fractures). Falls also become more likely, which can cause fractures. The good news is that, with diet and exercise, you can improve and maintain your bone health at any age. How to eat for bone health A balanced diet can supply many of the vitamins, minerals, and proteins you need for bone health. Older adults need to make sure they get enough calcium, vitamin D, and magnesium. You may need more of these as you age. Calcium  Calcium is the most important (essential) mineral for bone health. The daily requirement for calcium for adult men aged 60-70 years is 1,000 mg. For adult women aged 57-70 years, it is 1,200 mg. At age 70 or older, it is 1,200 mg for both men and women. Sources of calcium in your diet include: Dairy foods like milk, yogurt, and cheese. Dairy foods are the best sources. If you cannot eat dairy, you may need a calcium supplement. Leafy, dark green vegetables. These include collard greens, kale, broccoli, bok choy, and okra. Fatty fish, like sardines and canned salmon with bones. Almonds. Tofu.  Vitamin D You need vitamin D for bone health because it helps your body absorb calcium from your diet. It is not found naturally in many foods. Many people can benefit from taking a supplement. The daily requirement for vitamin D at age 64 or older is 600-1,000 international units (IU). Dietary sources for  vitamin D include: Fatty fish, such as swordfish, salmon, sardine, and mackerel. Foods that have vitamin D added to them (are fortified), like cereal and dairy products. Egg yolks. Magnesium Magnesium helps your body use both calcium and vitamin D. The recommended daily intake for adult men is 400-420 mg. For adult women, it is 310-320 mg. Dietary sources of magnesium include: Green vegetables, such as collard greens, kale, bok choy, and okra. Poppy, sesame, and chia seeds. Legumes, including peas and beans. Whole grains. Avocados. Nuts. If you drink alcohol regularly or take a type of antacid called a proton pump inhibitor, you might benefit from a magnesium supplement. How to exercise for bone health Exercise is important for bone health because it strengthens bones and muscles. Bones are living organs that get stronger when you exercise them, just like your heart and other muscles. Muscles support your bones and protect your joints. Strong muscles also help prevent bone loss and falls. Weight-bearing exercises and resistance exercises are the two types of exercise that are most important for bone health. Weight-bearing exercises include running or walking, climbing stairs, and playing sports like tennis. Resistance exercises include those done using free weights, weight machines, or resistance bands. Try to get at least 30 minutes of exercise every day. You can also include stretching, balance, and flexibility exercises, like yoga or tai chi. These lower your risk of falls. Follow these instructions at home Ask your health care provider if: You need a bone density test. This is especially important for women older than 65 and  men older than 50. You have been losing height. Loss of height may be a sign of weakening bones in your spine. Any of your medicines or medical conditions could affect your bone health. He or she could recommend an exercise program that is safe for you. Be sure it  includes both weight-bearing and resistance exercises. Do not use any products that contain nicotine or tobacco. These products include cigarettes, chewing tobacco, and vaping devices, such as e-cigarettes. These can reduce bone density. If you need help quitting, ask your health care provider. Drink alcohol in moderation. Alcohol reduces bone density and increases your risk for falls. If you drink alcohol: Limit how much you have to: 0-1 drink a day for women who are not pregnant. 0-2 drinks a day for men. Know how much alcohol is in a drink. In the [place] one drink equals one 12 oz bottle of beer (355 mL), one 5 oz glass of wine (148 mL), or one 1 oz glass of hard liquor (44 mL). Take over-the-counter and prescription medicines only as told by your health care provider. Ask your health care provider if you could benefit from taking calcium, vitamin D, or magnesium supplements. Keep all follow-up visits. This is important. Where to find more information American Bone Health: CardKnowledge.fi American Academy of Orthopaedic Surgeons: orthoinfo.[place]: bones.[place]Ex[place].es Summary Maintaining your bone health becomes more important as you age. You can improve and maintain your bone health with diet and exercise at any age. A balanced diet can supply many of the vitamins, minerals, and proteins you need for bone health. Older adults need to make sure they get enough calcium, vitamin D, and magnesium. Ask your health care provider if you could benefit from taking calcium, vitamin D, or magnesium supplements. Exercise is important for bone health because it strengthens bones and muscles. Do both weight-bearing and resistance exercises. This information is not intended to replace advice given to you by your health care provider. Make sure you discuss any questions you have with your health care provider. Document Revised: 12/30/2020 Document Reviewed:  12/30/2020 Elsevier Patient Education  [place].

## 2022-02-19 LAB — CBC WITH DIFFERENTIAL/PLATELET
Basophils Absolute: 0.1 10*3/uL (ref 0.0–0.2)
Basos: 1 %
EOS (ABSOLUTE): 0.1 10*3/uL (ref 0.0–0.4)
Eos: 1 %
Hematocrit: 41.3 % (ref 34.0–46.6)
Hemoglobin: 13.6 g/dL (ref 11.1–15.9)
Immature Grans (Abs): 0 10*3/uL (ref 0.0–0.1)
Immature Granulocytes: 0 %
Lymphocytes Absolute: 1.6 10*3/uL (ref 0.7–3.1)
Lymphs: 19 %
MCH: 32.2 pg (ref 26.6–33.0)
MCHC: 32.9 g/dL (ref 31.5–35.7)
MCV: 98 fL — ABNORMAL HIGH (ref 79–97)
Monocytes Absolute: 0.8 10*3/uL (ref 0.1–0.9)
Monocytes: 9 %
Neutrophils Absolute: 5.9 10*3/uL (ref 1.4–7.0)
Neutrophils: 70 %
Platelets: 152 10*3/uL (ref 150–450)
RBC: 4.22 x10E6/uL (ref 3.77–5.28)
RDW: 13.3 % (ref 11.7–15.4)
WBC: 8.4 10*3/uL (ref 3.4–10.8)

## 2022-02-19 LAB — COMPREHENSIVE METABOLIC PANEL
ALT: 21 IU/L (ref 0–32)
AST: 22 IU/L (ref 0–40)
Albumin/Globulin Ratio: 1.4 (ref 1.2–2.2)
Albumin: 3.9 g/dL (ref 3.7–4.7)
Alkaline Phosphatase: 60 IU/L (ref 44–121)
BUN/Creatinine Ratio: 18 (ref 12–28)
BUN: 17 mg/dL (ref 8–27)
Bilirubin Total: 0.6 mg/dL (ref 0.0–1.2)
CO2: 23 mmol/L (ref 20–29)
Calcium: 9.3 mg/dL (ref 8.7–10.3)
Chloride: 104 mmol/L (ref 96–106)
Creatinine, Ser: 0.95 mg/dL (ref 0.57–1.00)
Globulin, Total: 2.8 g/dL (ref 1.5–4.5)
Glucose: 103 mg/dL — ABNORMAL HIGH (ref 70–99)
Potassium: 4.4 mmol/L (ref 3.5–5.2)
Sodium: 141 mmol/L (ref 134–144)
Total Protein: 6.7 g/dL (ref 6.0–8.5)
eGFR: 58 mL/min/{1.73_m2} — ABNORMAL LOW (ref >59)

## 2022-02-19 LAB — LIPID PANEL
Chol/HDL Ratio: 3.2 ratio (ref 0.0–4.4)
Cholesterol, Total: 129 mg/dL (ref 100–199)
HDL: 40 mg/dL (ref >39)
LDL Chol Calc (NIH): 77 mg/dL (ref 0–99)
Triglycerides: 56 mg/dL (ref 0–149)
VLDL Cholesterol Cal: 12 mg/dL (ref 5–40)

## 2022-02-19 LAB — CARDIOVASCULAR RISK ASSESSMENT

## 2022-02-19 LAB — TSH: TSH: 2.9 u[IU]/mL (ref 0.450–4.500)

## 2022-02-21 ENCOUNTER — Other Ambulatory Visit: Payer: Self-pay

## 2022-02-21 ENCOUNTER — Telehealth: Payer: Self-pay

## 2022-02-21 DIAGNOSIS — F411 Generalized anxiety disorder: Secondary | ICD-10-CM

## 2022-02-21 MED ORDER — ALPRAZOLAM 0.25 MG PO TABS: ORAL_TABLET | ORAL | 3 refills | Status: AC

## 2022-02-21 NOTE — Chronic Care Management (AMB) (Signed)
Chronic Care Management Pharmacy Assistant   Name: Stephanie Douglas  MRN: [id number] DOB: 1934-05-14  Reason for Encounter: Medication Review/ Medication coordination  Recent office visits:  02-18-2022 Rip Harbour, NP. MCV= 98. Glucose= 103, eGFR= 58. MM Digital Screening ordered.  Recent consult visits:  None  Hospital visits:  None in previous 6 months  Medications: Outpatient Encounter Medications as of 02/21/2022  Medication Sig   Acetaminophen (TYLENOL PO) Take 500 mg by mouth at bedtime.    alendronate (FOSAMAX) 70 MG tablet Take 1 tablet (70 mg total) by mouth once a week.   ALPRAZolam (XANAX) 0.25 MG tablet TAKE 1 TABLET(0.25 MG) BY MOUTH TWICE DAILY AS NEEDED FOR ANXIETY   amiodarone (PACERONE) 200 MG tablet Take 1 tablet (200 mg total) by mouth daily.   apixaban (ELIQUIS) 5 MG TABS tablet Take 1 tablet (5 mg total) by mouth 2 (two) times daily.   BREZTRI AEROSPHERE 160-9-4.8 MCG/ACT AERO INHALE TWO PUFFS BY MOUTH INTO LUNGS TWICE DAILY   calcium carbonate (OSCAL) 1500 (600 Ca) MG TABS tablet Take by mouth 2 (two) times daily with a meal.   carvedilol (COREG) 3.125 MG tablet Take 1 tablet (3.125 mg total) by mouth 2 (two) times daily with a meal.   docusate sodium (COLACE) 250 MG capsule Take 1 capsule (250 mg total) by mouth daily as needed for constipation.   furosemide (LASIX) 20 MG tablet TAKE 1 TABLET BY MOUTH THREE TIMES A WEEK, T   loratadine (CLARITIN) 10 MG tablet Take 1 tablet (10 mg total) by mouth daily as needed for allergies.   Multiple Vitamins-Minerals (CENTRUM SILVER 50+WOMEN PO) Take 1 tablet by mouth daily.   nystatin-triamcinolone ointment (MYCOLOG) Apply topically 4 (four) times daily.   rosuvastatin (CRESTOR) 20 MG tablet Take 1 tablet (20 mg total) by mouth daily.   saccharomyces boulardii (FLORASTOR) 250 MG capsule Take 250 mg by mouth 2 (two) times daily.   triamcinolone cream (KENALOG) 0.1 % apply TOPICALLY TO THE affected AREA twice  daily   TURMERIC CURCUMIN PO Take 1 tablet by mouth at bedtime.   No facility-administered encounter medications on file as of 02/21/2022.   Reviewed chart for medication changes ahead of medication coordination call.  No hospital visits since last care coordination call/Pharmacist visit.   No medication changes indicated   BP Readings from Last 3 Encounters:  02/18/22 118/78  09/07/21 140/80  08/20/21 140/88    No results found for: "HGBA1C"   Patient obtains medications through Vials  90 Days   Last adherence delivery included:  Alendronate 70mg  1 tab once a week Amiodarone 200mg  1 tab once daily Carvedilol 3.125mg  1 tab twice daily Rosuvastatin 20mg  1 tab once daily Alprazolam 0.25mg  1 tab twice daily prn (30ds) Breztri Inhaler 172mcg/act 2 puffs twice daily (30ds) Furosemide 20mg  1 tab three times a week  Loratadine 10mg  1 tab once daily prn  Eliquis 5mg  1 tab twice daily  Nystatin-Triamcinolone Ointment 15gm- Apply topically 4 times daily  Patient declined (meds) last month: Unable to reach patient  Patient is due for next adherence delivery on: 03-03-2022  Called patient and reviewed medications and coordinated delivery.  This delivery to include: Breztri Inhaler 12mcg/act 2 puffs twice daily Amiodarone 200 mg daily Carvedilol 3.125 mg daily Furosemide 20 mg 3 times daily Alprazolam 0.25 mg twice daily prn Rosuvastatin 20 mg daily Loratadine 10 mg daily prn  Alendronate 70 mg weekly Eliquis 5 mg twice daily  Triamcinolone cream 0.10 %  twice daily.  No acute/ short fills needed  Patient declined the following medications: Eliquis- Patient stated she has 4 months supply at home.  Patient needs refills for: Sent Alprazolam   Confirmed delivery date of 03-03-2022 advised patient that pharmacy will contact them the morning of delivery.  Care Gaps: Shingrix overdue Covid booster overdue PNA vac overdue  Star Rating Drugs: Rosuvastatin 20 mg- Last  filled 11-29-2021 90 DS  [place] Clinical Pharmacist Assistant [phone]

## 2022-02-22 NOTE — Telephone Encounter (Signed)
Compliant on meds 

## 2022-03-01 DIAGNOSIS — Z7901 Long term (current) use of anticoagulants: Secondary | ICD-10-CM | POA: Diagnosis not present

## 2022-03-01 DIAGNOSIS — I8390 Asymptomatic varicose veins of unspecified lower extremity: Secondary | ICD-10-CM | POA: Diagnosis not present

## 2022-03-01 DIAGNOSIS — I480 Paroxysmal atrial fibrillation: Secondary | ICD-10-CM | POA: Diagnosis not present

## 2022-03-01 DIAGNOSIS — D6869 Other thrombophilia: Secondary | ICD-10-CM | POA: Diagnosis not present

## 2022-03-01 DIAGNOSIS — D692 Other nonthrombocytopenic purpura: Secondary | ICD-10-CM | POA: Diagnosis not present

## 2022-03-01 DIAGNOSIS — I509 Heart failure, unspecified: Secondary | ICD-10-CM | POA: Diagnosis not present

## 2022-03-01 DIAGNOSIS — E261 Secondary hyperaldosteronism: Secondary | ICD-10-CM | POA: Diagnosis not present

## 2022-03-01 DIAGNOSIS — Z6830 Body mass index (BMI) 30.0-30.9, adult: Secondary | ICD-10-CM | POA: Diagnosis not present

## 2022-03-01 DIAGNOSIS — I110 Hypertensive heart disease with heart failure: Secondary | ICD-10-CM | POA: Diagnosis not present

## 2022-03-17 ENCOUNTER — Telehealth: Payer: Self-pay | Admitting: Cardiology

## 2022-03-17 NOTE — Telephone Encounter (Signed)
Spoke with pt and daughter. Pt stated that she does not feel good and her BP is elevated. Recommended that she see her PCP or Urgent care to be checked. Pt and daughter agreed and will call for any further needs.

## 2022-03-17 NOTE — Telephone Encounter (Signed)
Pt c/o BP issue: STAT if pt c/o blurred vision, one-sided weakness or slurred speech  1. What are your last 5 BP readings?  166/90 - This morning HR 54  2. Are you having any other symptoms (ex. Dizziness, headache, blurred vision, passed out)? Dizzy  3. What is your BP issue? Daughter states that pt has not been feeling well and BP has been running a little high.

## 2022-03-18 ENCOUNTER — Telehealth: Payer: Self-pay

## 2022-03-18 DIAGNOSIS — R5383 Other fatigue: Secondary | ICD-10-CM | POA: Diagnosis not present

## 2022-03-18 DIAGNOSIS — R531 Weakness: Secondary | ICD-10-CM | POA: Diagnosis not present

## 2022-03-18 DIAGNOSIS — R739 Hyperglycemia, unspecified: Secondary | ICD-10-CM | POA: Diagnosis not present

## 2022-03-18 DIAGNOSIS — R42 Dizziness and giddiness: Secondary | ICD-10-CM | POA: Diagnosis not present

## 2022-03-18 NOTE — Telephone Encounter (Signed)
Stephanie Douglas left a message that Stephanie Douglas is not feeling well.  Her bp is 166/99, pulse 50's.  She has been feeling weak and light-headed.  She spoke to the cardiology office and they advised follow-up with her Primary Care.  They were concerned that she has a UTI or is possibly dehydrated.  I called Stephanie Douglas back and left a message that she needs to take her Mother to the Urgent Care for evaluation since we have no appointments available.  I also called and spoke with Stephanie Douglas and she agreed to have her daughter carry her to Urgent Care.

## 2022-03-28 DIAGNOSIS — I10 Essential (primary) hypertension: Secondary | ICD-10-CM | POA: Diagnosis not present

## 2022-03-28 DIAGNOSIS — E785 Hyperlipidemia, unspecified: Secondary | ICD-10-CM | POA: Diagnosis not present

## 2022-03-28 DIAGNOSIS — Z7901 Long term (current) use of anticoagulants: Secondary | ICD-10-CM | POA: Diagnosis not present

## 2022-03-28 DIAGNOSIS — I480 Paroxysmal atrial fibrillation: Secondary | ICD-10-CM | POA: Diagnosis not present

## 2022-03-28 DIAGNOSIS — R609 Edema, unspecified: Secondary | ICD-10-CM | POA: Diagnosis not present

## 2022-03-29 DIAGNOSIS — I509 Heart failure, unspecified: Secondary | ICD-10-CM | POA: Diagnosis not present

## 2022-03-29 DIAGNOSIS — I498 Other specified cardiac arrhythmias: Secondary | ICD-10-CM | POA: Diagnosis not present

## 2022-03-29 DIAGNOSIS — I440 Atrioventricular block, first degree: Secondary | ICD-10-CM | POA: Diagnosis not present

## 2022-03-29 DIAGNOSIS — I110 Hypertensive heart disease with heart failure: Secondary | ICD-10-CM | POA: Diagnosis not present

## 2022-03-30 ENCOUNTER — Ambulatory Visit: Payer: PPO | Admitting: Legal Medicine

## 2022-03-30 ENCOUNTER — Ambulatory Visit
Admission: RE | Admit: 2022-03-30 | Discharge: 2022-03-30 | Disposition: A | Discharge status: Home / Self Care | Payer: PPO | Source: Ambulatory Visit | Attending: Nurse Practitioner | Admitting: Nurse Practitioner

## 2022-03-30 DIAGNOSIS — Z1231 Encounter for screening mammogram for malignant neoplasm of breast: Secondary | ICD-10-CM

## 2022-04-11 ENCOUNTER — Ambulatory Visit (INDEPENDENT_AMBULATORY_CARE_PROVIDER_SITE_OTHER): Payer: PPO | Admitting: Nurse Practitioner

## 2022-04-11 ENCOUNTER — Encounter: Payer: Self-pay | Admitting: Nurse Practitioner

## 2022-04-11 VITALS — BP 136/86 | HR 64 | Temp 97.1°F | Ht 64.0 in | Wt 182.0 lb

## 2022-04-11 DIAGNOSIS — I10 Essential (primary) hypertension: Secondary | ICD-10-CM

## 2022-04-11 DIAGNOSIS — M25551 Pain in right hip: Secondary | ICD-10-CM

## 2022-04-11 MED ORDER — TRIAMCINOLONE ACETONIDE 40 MG/ML IJ SUSP
60.0000 mg | Freq: Once | INTRAMUSCULAR | Status: AC
  Administered 2022-04-11: 60 mg via INTRAMUSCULAR
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
## 2022-04-11 NOTE — Patient Instructions (Addendum)
Continue medications Rest and push fluids Kenalog  60 mg injections given in office Continue Tylenol and Ibuprofen as directed for hip pain Do hip exercises as tolerated Follow up as needed  Hip Pain The hip is the joint between the upper legs and the lower pelvis. The bones, cartilage, tendons, and muscles of your hip joint support your body and allow you to move around. Hip pain can range from a minor ache to severe pain in one or both of your hips. The pain may be felt on the inside of the hip joint near the groin, or on the outside near the buttocks and upper thigh. You may also have swelling or stiffness in your hip area. Follow these instructions at home: Managing pain, stiffness, and swelling     If directed, put ice on the painful area. To do this: Put ice in a plastic bag. Place a towel between your skin and the bag. Leave the ice on for 20 minutes, 2-3 times a day. If directed, apply heat to the affected area as often as told by your health care provider. Use the heat source that your health care provider recommends, such as a moist heat pack or a heating pad. Place a towel between your skin and the heat source. Leave the heat on for 20-30 minutes. Remove the heat if your skin turns bright red. This is especially important if you are unable to feel pain, heat, or cold. You may have a greater risk of getting burned. Activity Do exercises as told by your health care provider. Avoid activities that cause pain. General instructions  Take over-the-counter and prescription medicines only as told by your health care provider. Keep a journal of your symptoms. Write down: How often you have hip pain. The location of your pain. What the pain feels like. What makes the pain worse. Sleep with a pillow between your legs on your most comfortable side. Keep all follow-up visits as told by your health care provider. This is important. Contact a health care provider if: You cannot put  weight on your leg. Your pain or swelling continues or gets worse after one week. It gets harder to walk. You have a fever. Get help right away if: You fall. You have a sudden increase in pain and swelling in your hip. Your hip is red or swollen or very tender to touch. Summary Hip pain can range from a minor ache to severe pain in one or both of your hips. The pain may be felt on the inside of the hip joint near the groin, or on the outside near the buttocks and upper thigh. Avoid activities that cause pain. Write down how often you have hip pain, the location of the pain, what makes it worse, and what it feels like. This information is not intended to replace advice given to you by your health care provider. Make sure you discuss any questions you have with your health care provider. Document Revised: 12/03/2018 Document Reviewed: 12/03/2018 Elsevier Patient Education  [place].   Hip Exercises Ask your health care provider which exercises are safe for you. Do exercises exactly as told by your health care provider and adjust them as directed. It is normal to feel mild stretching, pulling, tightness, or discomfort as you do these exercises. Stop right away if you feel sudden pain or your pain gets worse. Do not begin these exercises until told by your health care provider. Stretching and range-of-motion exercises These exercises warm up  your muscles and joints and improve the movement and flexibility of your hip. These exercises also help to relieve pain, numbness, and tingling. You may be asked to limit your range of motion if you had a hip replacement. Talk to your health care provider about these restrictions. Hamstrings, supine  Lie on your back (supine position). Loop a belt or towel over the ball of your left / right foot. The ball of your foot is on the walking surface, right under your toes. Straighten your left / right knee and slowly pull on the belt or towel to raise  your leg until you feel a gentle stretch behind your knee (hamstring). Do not let your knee bend while you do this. Keep your other leg flat on the floor. Hold this position for __________ seconds. Slowly return your leg to the starting position. Repeat __________ times. Complete this exercise __________ times a day. Hip rotation  Lie on your back on a firm surface. With your left / right hand, gently pull your left / right knee toward the shoulder that is on the same side of the body. Stop when your knee is pointing toward the ceiling. Hold your left / right ankle with your other hand. Keeping your knee steady, gently pull your left / right ankle toward your other shoulder until you feel a stretch in your buttocks. Keep your hips and shoulders firmly planted while you do this stretch. Hold this position for __________ seconds. Repeat __________ times. Complete this exercise __________ times a day. Seated stretch This exercise is sometimes called hamstrings and adductors stretch. Sit on the floor with your legs stretched wide. Keep your knees straight during this exercise. Keeping your head and back in a straight line, bend at your waist to reach for your left foot (position A). You should feel a stretch in your right inner thigh (adductors). Hold this position for __________ seconds. Then slowly return to the upright position. Keeping your head and back in a straight line, bend at your waist to reach forward (position B). You should feel a stretch behind both of your thighs and knees (hamstrings). Hold this position for __________ seconds. Then slowly return to the upright position. Keeping your head and back in a straight line, bend at your waist to reach for your right foot (position C). You should feel a stretch in your left inner thigh (adductors). Hold this position for __________ seconds. Then slowly return to the upright position. Repeat __________ times. Complete this exercise  __________ times a day. Lunge This exercise stretches the muscles of the hip (hip flexors). Place your left / right knee on the floor and bend your other knee so that is directly over your ankle. You should be half-kneeling. Keep good posture with your head over your shoulders. Tighten your buttocks to point your tailbone downward. This will prevent your back from arching too much. You should feel a gentle stretch in the front of your left / right thigh and hip. If you do not feel a stretch, slide your other foot forward slightly and then slowly lunge forward with your chest up until your knee once again lines up over your ankle. Make sure your tailbone continues to point downward. Hold this position for __________ seconds. Slowly return to the starting position. Repeat __________ times. Complete this exercise __________ times a day. Strengthening exercises These exercises build strength and endurance in your hip. Endurance is the ability to use your muscles for a long time, even after  they get tired. Bridge This exercise strengthens the muscles of your hip (hip extensors). Lie on your back on a firm surface with your knees bent and your feet flat on the floor. Tighten your buttocks muscles and lift your bottom off the floor until the trunk of your body and your hips are level with your thighs. Do not arch your back. You should feel the muscles working in your buttocks and the back of your thighs. If you do not feel these muscles, slide your feet 1-2 inches (2.5-5 cm) farther away from your buttocks. Hold this position for __________ seconds. Slowly lower your hips to the starting position. Let your muscles relax completely between repetitions. Repeat __________ times. Complete this exercise __________ times a day. Straight leg raises, side-lying This exercise strengthens the muscles that move the hip joint away from the center of the body (hip abductors). Lie on your side with your left /  right leg in the top position. Lie so your head, shoulder, hip, and knee line up. You may bend your bottom knee slightly to help you balance. Roll your hips slightly forward, so your hips are stacked directly over each other and your left / right knee is facing forward. Leading with your heel, lift your top leg 4-6 inches (10-15 cm). You should feel the muscles in your top hip lifting. Do not let your foot drift forward. Do not let your knee roll toward the ceiling. Hold this position for __________ seconds. Slowly return to the starting position. Let your muscles relax completely between repetitions. Repeat __________ times. Complete this exercise __________ times a day. Straight leg raises, side-lying This exercise strengthens the muscles that move the hip joint toward the center of the body (hip adductors). Lie on your side with your left / right leg in the bottom position. Lie so your head, shoulder, hip, and knee line up. You may place your upper foot in front to help you balance. Roll your hips slightly forward, so your hips are stacked directly over each other and your left / right knee is facing forward. Tense the muscles in your inner thigh and lift your bottom leg 4-6 inches (10-15 cm). Hold this position for __________ seconds. Slowly return to the starting position. Let your muscles relax completely between repetitions. Repeat __________ times. Complete this exercise __________ times a day. Straight leg raises, supine This exercise strengthens the muscles in the front of your thigh (quadriceps). Lie on your back (supine position) with your left / right leg extended and your other knee bent. Tense the muscles in the front of your left / right thigh. You should see your kneecap slide up or see increased dimpling just above your knee. Keep these muscles tight as you raise your leg 4-6 inches (10-15 cm) off the floor. Do not let your knee bend. Hold this position for __________  seconds. Keep these muscles tense as you lower your leg. Relax the muscles slowly and completely between repetitions. Repeat __________ times. Complete this exercise __________ times a day. Hip abductors, standing This exercise strengthens the muscles that move the leg and hip joint away from the center of the body (hip abductors). Tie one end of a rubber exercise band or tubing to a secure surface, such as a chair, table, or pole. Loop the other end of the band or tubing around your left / right ankle. Keeping your ankle with the band or tubing directly opposite the secured end, step away until there is tension in  the tubing or band. Hold on to a chair, table, or pole as needed for balance. Lift your left / right leg out to your side. While you do this: Keep your back upright. Keep your shoulders over your hips. Keep your toes pointing forward. Make sure to use your hip muscles to slowly lift your leg. Do not tip your body or forcefully lift your leg. Hold this position for __________ seconds. Slowly return to the starting position. Repeat __________ times. Complete this exercise __________ times a day. Squats This exercise strengthens the muscles in the front of your thigh (quadriceps). Stand in a door frame so your feet and knees are in line with the frame. You may place your hands on the frame for balance. Slowly bend your knees and lower your hips like you are going to sit in a chair. Keep your lower legs in a straight-up-and-down position. Do not let your hips go lower than your knees. Do not bend your knees lower than told by your health care provider. If your hip pain increases, do not bend as low. Hold this position for ___________ seconds. Slowly push with your legs to return to standing. Do not use your hands to pull yourself to standing. Repeat __________ times. Complete this exercise __________ times a day. This information is not intended to replace advice given to you by  your health care provider. Make sure you discuss any questions you have with your health care provider. Document Revised: 12/02/2020 Document Reviewed: 12/02/2020 Elsevier Patient Education  [place] Your Hypertension Hypertension, also called high blood pressure, is when the force of the blood pressing against the walls of the arteries is too strong. Arteries are blood vessels that carry blood from your heart throughout your body. Hypertension forces the heart to work harder to pump blood and may cause the arteries to become narrow or stiff. Understanding blood pressure readings A blood pressure reading includes a higher number over a lower number: The first, or top, number is called the systolic pressure. It is a measure of the pressure in your arteries as your heart beats. The second, or bottom number, is called the diastolic pressure. It is a measure of the pressure in your arteries as the heart relaxes. For most people, a normal blood pressure is below 120/80. Your personal target blood pressure may vary depending on your medical conditions, your age, and other factors. Blood pressure is classified into four stages. Based on your blood pressure reading, your health care provider may use the following stages to determine what type of treatment you need, if any. Systolic pressure and diastolic pressure are measured in a unit called millimeters of mercury (mmHg). Normal Systolic pressure: below 621. Diastolic pressure: below 80. Elevated Systolic pressure: 308-657. Diastolic pressure: below 80. Hypertension stage 1 Systolic pressure: 846-962. Diastolic pressure: 95-28. Hypertension stage 2 Systolic pressure: 413 or above. Diastolic pressure: 90 or above. How can this condition affect me? Managing your hypertension is very important. Over time, hypertension can damage the arteries and decrease blood flow to parts of the body, including the brain, heart, and kidneys.  Having untreated or uncontrolled hypertension can lead to: A heart attack. A stroke. A weakened blood vessel (aneurysm). Heart failure. Kidney damage. Eye damage. Memory and concentration problems. Vascular dementia. What actions can I take to manage this condition? Hypertension can be managed by making lifestyle changes and possibly by taking medicines. Your health care provider will help you make a plan to bring  your blood pressure within a normal range. You may be referred for counseling on a healthy diet and physical activity. Nutrition  Eat a diet that is high in fiber and potassium, and low in salt (sodium), added sugar, and fat. An example eating plan is called the DASH diet. DASH stands for Dietary Approaches to Stop Hypertension. To eat this way: Eat plenty of fresh fruits and vegetables. Try to fill one-half of your plate at each meal with fruits and vegetables. Eat whole grains, such as whole-wheat pasta, brown rice, or whole-grain bread. Fill about one-fourth of your plate with whole grains. Eat low-fat dairy products. Avoid fatty cuts of meat, processed or cured meats, and poultry with skin. Fill about one-fourth of your plate with lean proteins such as fish, chicken without skin, beans, eggs, and tofu. Avoid pre-made and processed foods. These tend to be higher in sodium, added sugar, and fat. Reduce your daily sodium intake. Many people with hypertension should eat less than 1,500 mg of sodium a day. Lifestyle  Work with your health care provider to maintain a healthy body weight or to lose weight. Ask what an ideal weight is for you. Get at least 30 minutes of exercise that causes your heart to beat faster (aerobic exercise) most days of the week. Activities may include walking, swimming, or biking. Include exercise to strengthen your muscles (resistance exercise), such as weight lifting, as part of your weekly exercise routine. Try to do these types of exercises for 30  minutes at least 3 days a week. Do not use any products that contain nicotine or tobacco. These products include cigarettes, chewing tobacco, and vaping devices, such as e-cigarettes. If you need help quitting, ask your health care provider. Control any long-term (chronic) conditions you have, such as high cholesterol or diabetes. Identify your sources of stress and find ways to manage stress. This may include meditation, deep breathing, or making time for fun activities. Alcohol use Do not drink alcohol if: Your health care provider tells you not to drink. You are pregnant, may be pregnant, or are planning to become pregnant. If you drink alcohol: Limit how much you have to: 0-1 drink a day for women. 0-2 drinks a day for men. Know how much alcohol is in your drink. In the [place] one drink equals one 12 oz bottle of beer (355 mL), one 5 oz glass of wine (148 mL), or one 1 oz glass of hard liquor (44 mL). Medicines Your health care provider may prescribe medicine if lifestyle changes are not enough to get your blood pressure under control and if: Your systolic blood pressure is 130 or higher. Your diastolic blood pressure is 80 or higher. Take medicines only as told by your health care provider. Follow the directions carefully. Blood pressure medicines must be taken as told by your health care provider. The medicine does not work as well when you skip doses. Skipping doses also puts you at risk for problems. Monitoring Before you monitor your blood pressure: Do not smoke, drink caffeinated beverages, or exercise within 30 minutes before taking a measurement. Use the bathroom and empty your bladder (urinate). Sit quietly for at least 5 minutes before taking measurements. Monitor your blood pressure at home as told by your health care provider. To do this: Sit with your back straight and supported. Place your feet flat on the floor. Do not cross your legs. Support your arm on a flat surface,  such as a table. Make sure your upper  arm is at heart level. Each time you measure, take two or three readings one minute apart and record the results. You may also need to have your blood pressure checked regularly by your health care provider. General information Talk with your health care provider about your diet, exercise habits, and other lifestyle factors that may be contributing to hypertension. Review all the medicines you take with your health care provider because there may be side effects or interactions. Keep all follow-up visits. Your health care provider can help you create and adjust your plan for managing your high blood pressure. Where to find more information National Heart, Lung, and Blood Institute: https://wilson-eaton.com/ American Heart Association: www.heart.org Contact a health care provider if: You think you are having a reaction to medicines you have taken. You have repeated (recurrent) headaches. You feel dizzy. You have swelling in your ankles. You have trouble with your vision. Get help right away if: You develop a severe headache or confusion. You have unusual weakness or numbness, or you feel faint. You have severe pain in your chest or abdomen. You vomit repeatedly. You have trouble breathing. These symptoms may be an emergency. Get help right away. Call 911. Do not wait to see if the symptoms will go away. Do not drive yourself to the hospital. Summary Hypertension is when the force of blood pumping through your arteries is too strong. If this condition is not controlled, it may put you at risk for serious complications. Your personal target blood pressure may vary depending on your medical conditions, your age, and other factors. For most people, a normal blood pressure is less than 120/80. Hypertension is managed by lifestyle changes, medicines, or both. Lifestyle changes to help manage hypertension include losing weight, eating a healthy, low-sodium diet,  exercising more, stopping smoking, and limiting alcohol. This information is not intended to replace advice given to you by your health care provider. Make sure you discuss any questions you have with your health care provider. Document Revised: 04/01/2021 Document Reviewed: 04/01/2021 Elsevier Patient Education  [place].

## 2022-04-20 DIAGNOSIS — I480 Paroxysmal atrial fibrillation: Secondary | ICD-10-CM | POA: Diagnosis not present

## 2022-04-26 DIAGNOSIS — I491 Atrial premature depolarization: Secondary | ICD-10-CM | POA: Diagnosis not present

## 2022-04-26 DIAGNOSIS — I493 Ventricular premature depolarization: Secondary | ICD-10-CM | POA: Diagnosis not present

## 2022-04-26 DIAGNOSIS — I471 Supraventricular tachycardia: Secondary | ICD-10-CM | POA: Diagnosis not present

## 2022-04-27 DIAGNOSIS — R350 Frequency of micturition: Secondary | ICD-10-CM | POA: Diagnosis not present

## 2022-04-27 DIAGNOSIS — J449 Chronic obstructive pulmonary disease, unspecified: Secondary | ICD-10-CM | POA: Diagnosis not present

## 2022-04-27 DIAGNOSIS — I480 Paroxysmal atrial fibrillation: Secondary | ICD-10-CM | POA: Diagnosis not present

## 2022-04-27 DIAGNOSIS — E782 Mixed hyperlipidemia: Secondary | ICD-10-CM | POA: Diagnosis not present

## 2022-04-27 DIAGNOSIS — F411 Generalized anxiety disorder: Secondary | ICD-10-CM | POA: Diagnosis not present

## 2022-04-27 DIAGNOSIS — I10 Essential (primary) hypertension: Secondary | ICD-10-CM | POA: Diagnosis not present

## 2022-04-27 DIAGNOSIS — F320 Major depressive disorder, single episode, mild: Secondary | ICD-10-CM | POA: Diagnosis not present

## 2022-04-27 DIAGNOSIS — Z7901 Long term (current) use of anticoagulants: Secondary | ICD-10-CM | POA: Diagnosis not present

## 2022-05-06 ENCOUNTER — Other Ambulatory Visit: Payer: Self-pay | Admitting: Nurse Practitioner

## 2022-05-06 DIAGNOSIS — D692 Other nonthrombocytopenic purpura: Secondary | ICD-10-CM

## 2022-05-16 DIAGNOSIS — I480 Paroxysmal atrial fibrillation: Secondary | ICD-10-CM | POA: Diagnosis not present

## 2022-05-25 DIAGNOSIS — E782 Mixed hyperlipidemia: Secondary | ICD-10-CM | POA: Diagnosis not present

## 2022-05-25 DIAGNOSIS — I10 Essential (primary) hypertension: Secondary | ICD-10-CM | POA: Diagnosis not present

## 2022-05-25 DIAGNOSIS — I480 Paroxysmal atrial fibrillation: Secondary | ICD-10-CM | POA: Diagnosis not present

## 2022-05-25 DIAGNOSIS — I350 Nonrheumatic aortic (valve) stenosis: Secondary | ICD-10-CM | POA: Diagnosis not present

## 2022-05-25 DIAGNOSIS — I7789 Other specified disorders of arteries and arterioles: Secondary | ICD-10-CM | POA: Diagnosis not present

## 2022-05-25 DIAGNOSIS — Z7901 Long term (current) use of anticoagulants: Secondary | ICD-10-CM | POA: Diagnosis not present

## 2022-05-27 ENCOUNTER — Other Ambulatory Visit: Payer: Self-pay | Admitting: Legal Medicine

## 2022-05-27 ENCOUNTER — Other Ambulatory Visit: Payer: Self-pay | Admitting: Family Medicine

## 2022-05-27 DIAGNOSIS — M810 Age-related osteoporosis without current pathological fracture: Secondary | ICD-10-CM

## 2022-05-27 DIAGNOSIS — Z79899 Other long term (current) drug therapy: Secondary | ICD-10-CM

## 2022-05-29 ENCOUNTER — Other Ambulatory Visit: Payer: Self-pay | Admitting: Nurse Practitioner

## 2022-05-29 DIAGNOSIS — D692 Other nonthrombocytopenic purpura: Secondary | ICD-10-CM

## 2022-06-06 ENCOUNTER — Ambulatory Visit: Payer: PPO | Admitting: Podiatry

## 2022-06-06 DIAGNOSIS — B351 Tinea unguium: Secondary | ICD-10-CM

## 2022-06-06 DIAGNOSIS — L84 Corns and callosities: Secondary | ICD-10-CM | POA: Diagnosis not present

## 2022-06-06 DIAGNOSIS — M79674 Pain in right toe(s): Secondary | ICD-10-CM

## 2022-06-06 DIAGNOSIS — M79675 Pain in left toe(s): Secondary | ICD-10-CM | POA: Diagnosis not present

## 2022-06-06 NOTE — Progress Notes (Signed)
  Subjective:  Patient ID: Stephanie Douglas, female    DOB: September 03, 1933,  MRN: [id number]  Chief Complaint  Patient presents with   Nail Problem    Routine Foot Care    88 y.o. female presents with the above complaint. History confirmed with patient. Patient presenting with pain related to dystrophic thickened elongated nails. Patient is unable to trim own nails related to nail dystrophy and/or mobility issues. Patient does not have a history of T2DM. Patient does have callus present located at the plantar foot bilaterally causing pain.   Objective:  Physical Exam: warm, good capillary refill nail exam onychomycosis of the toenails, onycholysis, and dystrophic nails DP pulses palpable, PT pulses palpable, and protective sensation intact Left Foot:  Pain with palpation of nails due to elongation and dystrophic growth. Hyperkeratotic lesion sub 3rd met head left foot with central core consistent with porokeratois. Callus sub 5th met head left foot. Right Foot: Pain with palpation of nails due to elongation and dystrophic growth. Callus sub 5th met head.   Assessment:   1. Pain due to onychomycosis of toenails of both feet   2. Pre-ulcerative calluses      Plan:  Patient was evaluated and treated and all questions answered.  #Hyperkeratotic lesions/pre ulcerative calluses present sub 5th met head bilaterally and sub 3rd met head left foot All symptomatic hyperkeratoses x 3 separate lesions were safely debrided with a sterile #10 blade to patient's level of comfort without incident. We discussed preventative and palliative care of these lesions including supportive and accommodative shoegear, padding, prefabricated and custom molded accommodative orthoses, use of a pumice stone and lotions/creams daily.  #Onychomycosis with pain  -Nails palliatively debrided as below. -Educated on self-care  Procedure: Nail Debridement Rationale: Pain Type of Debridement: manual, sharp  debridement. Instrumentation: Nail nipper, rotary burr. Number of Nails: 10  Return in about 3 months (around 09/06/2022) for RFC.         Everitt Amber, DPM [facility]

## 2022-08-03 DIAGNOSIS — J40 Bronchitis, not specified as acute or chronic: Secondary | ICD-10-CM | POA: Diagnosis not present

## 2022-08-18 ENCOUNTER — Other Ambulatory Visit: Payer: Self-pay | Admitting: Family Medicine

## 2022-08-18 DIAGNOSIS — Z79899 Other long term (current) drug therapy: Secondary | ICD-10-CM

## 2022-08-24 ENCOUNTER — Other Ambulatory Visit: Payer: Self-pay | Admitting: Nurse Practitioner

## 2022-08-24 DIAGNOSIS — F411 Generalized anxiety disorder: Secondary | ICD-10-CM

## 2022-08-26 ENCOUNTER — Other Ambulatory Visit: Payer: Self-pay | Admitting: Nurse Practitioner

## 2022-08-26 ENCOUNTER — Ambulatory Visit: Payer: PPO | Admitting: Nurse Practitioner

## 2022-08-26 DIAGNOSIS — F411 Generalized anxiety disorder: Secondary | ICD-10-CM

## 2022-09-12 ENCOUNTER — Ambulatory Visit (INDEPENDENT_AMBULATORY_CARE_PROVIDER_SITE_OTHER): Payer: PPO | Admitting: Podiatry

## 2022-09-12 DIAGNOSIS — Z91199 Patient's noncompliance with other medical treatment and regimen due to unspecified reason: Secondary | ICD-10-CM

## 2022-09-12 NOTE — Progress Notes (Signed)
Pt was a no show for apt, charge generated

## 2022-09-13 DIAGNOSIS — I480 Paroxysmal atrial fibrillation: Secondary | ICD-10-CM | POA: Diagnosis not present

## 2022-09-13 DIAGNOSIS — Z79899 Other long term (current) drug therapy: Secondary | ICD-10-CM | POA: Diagnosis not present

## 2022-09-13 DIAGNOSIS — I350 Nonrheumatic aortic (valve) stenosis: Secondary | ICD-10-CM | POA: Diagnosis not present

## 2022-09-13 DIAGNOSIS — I10 Essential (primary) hypertension: Secondary | ICD-10-CM | POA: Diagnosis not present

## 2022-09-13 DIAGNOSIS — J449 Chronic obstructive pulmonary disease, unspecified: Secondary | ICD-10-CM | POA: Diagnosis not present

## 2022-09-13 DIAGNOSIS — Z8739 Personal history of other diseases of the musculoskeletal system and connective tissue: Secondary | ICD-10-CM | POA: Diagnosis not present

## 2022-09-13 DIAGNOSIS — J3089 Other allergic rhinitis: Secondary | ICD-10-CM | POA: Diagnosis not present

## 2022-09-13 DIAGNOSIS — E782 Mixed hyperlipidemia: Secondary | ICD-10-CM | POA: Diagnosis not present

## 2022-09-13 DIAGNOSIS — F411 Generalized anxiety disorder: Secondary | ICD-10-CM | POA: Diagnosis not present

## 2022-09-13 DIAGNOSIS — I7789 Other specified disorders of arteries and arterioles: Secondary | ICD-10-CM | POA: Diagnosis not present

## 2022-09-13 DIAGNOSIS — R5381 Other malaise: Secondary | ICD-10-CM | POA: Diagnosis not present

## 2022-09-13 DIAGNOSIS — Z7901 Long term (current) use of anticoagulants: Secondary | ICD-10-CM | POA: Diagnosis not present

## 2022-09-13 DIAGNOSIS — F330 Major depressive disorder, recurrent, mild: Secondary | ICD-10-CM | POA: Diagnosis not present

## 2022-09-13 DIAGNOSIS — R5383 Other fatigue: Secondary | ICD-10-CM | POA: Diagnosis not present

## 2022-10-04 DIAGNOSIS — E538 Deficiency of other specified B group vitamins: Secondary | ICD-10-CM | POA: Diagnosis not present

## 2022-10-04 DIAGNOSIS — R5383 Other fatigue: Secondary | ICD-10-CM | POA: Diagnosis not present

## 2022-10-04 DIAGNOSIS — F320 Major depressive disorder, single episode, mild: Secondary | ICD-10-CM | POA: Diagnosis not present

## 2022-10-04 DIAGNOSIS — R5381 Other malaise: Secondary | ICD-10-CM | POA: Diagnosis not present

## 2022-10-21 DIAGNOSIS — I509 Heart failure, unspecified: Secondary | ICD-10-CM | POA: Diagnosis not present

## 2022-10-21 DIAGNOSIS — D6869 Other thrombophilia: Secondary | ICD-10-CM | POA: Diagnosis not present

## 2022-10-21 DIAGNOSIS — E261 Secondary hyperaldosteronism: Secondary | ICD-10-CM | POA: Diagnosis not present

## 2022-10-21 DIAGNOSIS — I480 Paroxysmal atrial fibrillation: Secondary | ICD-10-CM | POA: Diagnosis not present

## 2022-10-21 DIAGNOSIS — D692 Other nonthrombocytopenic purpura: Secondary | ICD-10-CM | POA: Diagnosis not present

## 2022-10-21 DIAGNOSIS — J449 Chronic obstructive pulmonary disease, unspecified: Secondary | ICD-10-CM | POA: Diagnosis not present

## 2022-10-24 ENCOUNTER — Telehealth: Payer: PPO

## 2022-10-26 DIAGNOSIS — F331 Major depressive disorder, recurrent, moderate: Secondary | ICD-10-CM | POA: Diagnosis not present

## 2022-11-23 DIAGNOSIS — E782 Mixed hyperlipidemia: Secondary | ICD-10-CM | POA: Diagnosis not present

## 2022-11-23 DIAGNOSIS — I480 Paroxysmal atrial fibrillation: Secondary | ICD-10-CM | POA: Diagnosis not present

## 2022-11-23 DIAGNOSIS — I350 Nonrheumatic aortic (valve) stenosis: Secondary | ICD-10-CM | POA: Diagnosis not present

## 2022-11-23 DIAGNOSIS — I7789 Other specified disorders of arteries and arterioles: Secondary | ICD-10-CM | POA: Diagnosis not present

## 2022-11-23 DIAGNOSIS — Z7901 Long term (current) use of anticoagulants: Secondary | ICD-10-CM | POA: Diagnosis not present

## 2022-12-08 DIAGNOSIS — H524 Presbyopia: Secondary | ICD-10-CM | POA: Diagnosis not present

## 2022-12-08 DIAGNOSIS — Z9849 Cataract extraction status, unspecified eye: Secondary | ICD-10-CM | POA: Diagnosis not present

## 2022-12-08 DIAGNOSIS — H52223 Regular astigmatism, bilateral: Secondary | ICD-10-CM | POA: Diagnosis not present

## 2022-12-08 DIAGNOSIS — H5213 Myopia, bilateral: Secondary | ICD-10-CM | POA: Diagnosis not present

## 2022-12-08 DIAGNOSIS — Z961 Presence of intraocular lens: Secondary | ICD-10-CM | POA: Diagnosis not present

## 2023-04-24 DIAGNOSIS — E785 Hyperlipidemia, unspecified: Secondary | ICD-10-CM | POA: Diagnosis not present

## 2023-04-24 DIAGNOSIS — I509 Heart failure, unspecified: Secondary | ICD-10-CM | POA: Diagnosis not present

## 2023-04-24 DIAGNOSIS — I110 Hypertensive heart disease with heart failure: Secondary | ICD-10-CM | POA: Diagnosis not present

## 2023-04-24 DIAGNOSIS — F419 Anxiety disorder, unspecified: Secondary | ICD-10-CM | POA: Diagnosis not present

## 2023-04-24 DIAGNOSIS — D692 Other nonthrombocytopenic purpura: Secondary | ICD-10-CM | POA: Diagnosis not present

## 2023-04-24 DIAGNOSIS — E261 Secondary hyperaldosteronism: Secondary | ICD-10-CM | POA: Diagnosis not present

## 2023-04-24 DIAGNOSIS — Z7901 Long term (current) use of anticoagulants: Secondary | ICD-10-CM | POA: Diagnosis not present

## 2023-04-24 DIAGNOSIS — D6869 Other thrombophilia: Secondary | ICD-10-CM | POA: Diagnosis not present

## 2023-04-24 DIAGNOSIS — Z6826 Body mass index (BMI) 26.0-26.9, adult: Secondary | ICD-10-CM | POA: Diagnosis not present

## 2023-04-24 DIAGNOSIS — I480 Paroxysmal atrial fibrillation: Secondary | ICD-10-CM | POA: Diagnosis not present

## 2023-05-24 DIAGNOSIS — I080 Rheumatic disorders of both mitral and aortic valves: Secondary | ICD-10-CM | POA: Diagnosis not present

## 2023-06-09 DIAGNOSIS — I7789 Other specified disorders of arteries and arterioles: Secondary | ICD-10-CM | POA: Diagnosis not present

## 2023-06-09 DIAGNOSIS — Z7901 Long term (current) use of anticoagulants: Secondary | ICD-10-CM | POA: Diagnosis not present

## 2023-06-09 DIAGNOSIS — I480 Paroxysmal atrial fibrillation: Secondary | ICD-10-CM | POA: Diagnosis not present

## 2023-06-09 DIAGNOSIS — I350 Nonrheumatic aortic (valve) stenosis: Secondary | ICD-10-CM | POA: Diagnosis not present

## 2023-06-09 DIAGNOSIS — E782 Mixed hyperlipidemia: Secondary | ICD-10-CM | POA: Diagnosis not present

## 2023-06-17 DIAGNOSIS — L570 Actinic keratosis: Secondary | ICD-10-CM | POA: Diagnosis not present

## 2023-06-17 DIAGNOSIS — D2239 Melanocytic nevi of other parts of face: Secondary | ICD-10-CM | POA: Diagnosis not present

## 2023-06-17 DIAGNOSIS — D225 Melanocytic nevi of trunk: Secondary | ICD-10-CM | POA: Diagnosis not present

## 2023-06-17 DIAGNOSIS — D485 Neoplasm of uncertain behavior of skin: Secondary | ICD-10-CM | POA: Diagnosis not present

## 2023-06-17 DIAGNOSIS — L820 Inflamed seborrheic keratosis: Secondary | ICD-10-CM | POA: Diagnosis not present

## 2023-06-20 DIAGNOSIS — I480 Paroxysmal atrial fibrillation: Secondary | ICD-10-CM | POA: Diagnosis not present

## 2023-06-20 DIAGNOSIS — I361 Nonrheumatic tricuspid (valve) insufficiency: Secondary | ICD-10-CM | POA: Diagnosis not present

## 2023-06-20 DIAGNOSIS — F411 Generalized anxiety disorder: Secondary | ICD-10-CM | POA: Diagnosis not present

## 2023-06-20 DIAGNOSIS — I129 Hypertensive chronic kidney disease with stage 1 through stage 4 chronic kidney disease, or unspecified chronic kidney disease: Secondary | ICD-10-CM | POA: Diagnosis not present

## 2023-06-20 DIAGNOSIS — I350 Nonrheumatic aortic (valve) stenosis: Secondary | ICD-10-CM | POA: Diagnosis not present

## 2023-06-20 DIAGNOSIS — J449 Chronic obstructive pulmonary disease, unspecified: Secondary | ICD-10-CM | POA: Diagnosis not present

## 2023-06-20 DIAGNOSIS — R5383 Other fatigue: Secondary | ICD-10-CM | POA: Diagnosis not present

## 2023-06-20 DIAGNOSIS — N1831 Chronic kidney disease, stage 3a: Secondary | ICD-10-CM | POA: Diagnosis not present

## 2023-06-20 DIAGNOSIS — R5381 Other malaise: Secondary | ICD-10-CM | POA: Diagnosis not present

## 2023-11-22 DIAGNOSIS — R062 Wheezing: Secondary | ICD-10-CM | POA: Diagnosis not present

## 2023-11-22 DIAGNOSIS — R0981 Nasal congestion: Secondary | ICD-10-CM | POA: Diagnosis not present

## 2023-11-22 DIAGNOSIS — R051 Acute cough: Secondary | ICD-10-CM | POA: Diagnosis not present

## 2023-11-22 DIAGNOSIS — R0600 Dyspnea, unspecified: Secondary | ICD-10-CM | POA: Diagnosis not present

## 2024-01-03 DIAGNOSIS — R072 Precordial pain: Secondary | ICD-10-CM | POA: Diagnosis not present

## 2024-01-03 DIAGNOSIS — Z7901 Long term (current) use of anticoagulants: Secondary | ICD-10-CM | POA: Diagnosis not present

## 2024-01-03 DIAGNOSIS — E782 Mixed hyperlipidemia: Secondary | ICD-10-CM | POA: Diagnosis not present

## 2024-01-03 DIAGNOSIS — I480 Paroxysmal atrial fibrillation: Secondary | ICD-10-CM | POA: Diagnosis not present

## 2024-01-03 DIAGNOSIS — I350 Nonrheumatic aortic (valve) stenosis: Secondary | ICD-10-CM | POA: Diagnosis not present

## 2024-01-10 DIAGNOSIS — E782 Mixed hyperlipidemia: Secondary | ICD-10-CM | POA: Diagnosis not present

## 2024-01-10 DIAGNOSIS — R5383 Other fatigue: Secondary | ICD-10-CM | POA: Diagnosis not present

## 2024-01-10 DIAGNOSIS — Z7901 Long term (current) use of anticoagulants: Secondary | ICD-10-CM | POA: Diagnosis not present

## 2024-01-10 DIAGNOSIS — Z0000 Encounter for general adult medical examination without abnormal findings: Secondary | ICD-10-CM | POA: Diagnosis not present

## 2024-01-10 DIAGNOSIS — R5381 Other malaise: Secondary | ICD-10-CM | POA: Diagnosis not present

## 2024-01-10 DIAGNOSIS — I10 Essential (primary) hypertension: Secondary | ICD-10-CM | POA: Diagnosis not present

## 2024-01-10 DIAGNOSIS — J449 Chronic obstructive pulmonary disease, unspecified: Secondary | ICD-10-CM | POA: Diagnosis not present

## 2024-01-10 DIAGNOSIS — M810 Age-related osteoporosis without current pathological fracture: Secondary | ICD-10-CM | POA: Diagnosis not present

## 2024-01-10 DIAGNOSIS — I480 Paroxysmal atrial fibrillation: Secondary | ICD-10-CM | POA: Diagnosis not present

## 2024-01-10 DIAGNOSIS — F411 Generalized anxiety disorder: Secondary | ICD-10-CM | POA: Diagnosis not present

## 2024-01-10 DIAGNOSIS — E538 Deficiency of other specified B group vitamins: Secondary | ICD-10-CM | POA: Diagnosis not present

## 2024-06-07 DIAGNOSIS — E782 Mixed hyperlipidemia: Secondary | ICD-10-CM | POA: Diagnosis not present

## 2024-06-07 DIAGNOSIS — Z7901 Long term (current) use of anticoagulants: Secondary | ICD-10-CM | POA: Diagnosis not present

## 2024-06-07 DIAGNOSIS — R609 Edema, unspecified: Secondary | ICD-10-CM | POA: Diagnosis not present

## 2024-06-07 DIAGNOSIS — I350 Nonrheumatic aortic (valve) stenosis: Secondary | ICD-10-CM | POA: Diagnosis not present

## 2024-06-07 DIAGNOSIS — I480 Paroxysmal atrial fibrillation: Secondary | ICD-10-CM | POA: Diagnosis not present

## 2024-06-19 DIAGNOSIS — I352 Nonrheumatic aortic (valve) stenosis with insufficiency: Secondary | ICD-10-CM | POA: Diagnosis not present

## 2024-06-19 DIAGNOSIS — I517 Cardiomegaly: Secondary | ICD-10-CM | POA: Diagnosis not present

## 2024-06-21 DIAGNOSIS — L820 Inflamed seborrheic keratosis: Secondary | ICD-10-CM | POA: Diagnosis not present

## 2024-06-21 DIAGNOSIS — D225 Melanocytic nevi of trunk: Secondary | ICD-10-CM | POA: Diagnosis not present

## 2024-06-21 DIAGNOSIS — D485 Neoplasm of uncertain behavior of skin: Secondary | ICD-10-CM | POA: Diagnosis not present

## 2024-06-21 DIAGNOSIS — L570 Actinic keratosis: Secondary | ICD-10-CM | POA: Diagnosis not present

## 2024-06-21 DIAGNOSIS — L578 Other skin changes due to chronic exposure to nonionizing radiation: Secondary | ICD-10-CM | POA: Diagnosis not present

## 2024-06-21 DIAGNOSIS — D2239 Melanocytic nevi of other parts of face: Secondary | ICD-10-CM | POA: Diagnosis not present
